# Patient Record
Sex: Female | Born: 1980 | Race: Black or African American | Hispanic: No | Smoking: Never smoker
Health system: Southern US, Community
[De-identification: ages and names within clinical notes are randomized; demographics above are authoritative.]

## PROBLEM LIST (undated history)

## (undated) DIAGNOSIS — F902 Attention-deficit hyperactivity disorder, combined type: Secondary | ICD-10-CM

## (undated) DIAGNOSIS — F908 Attention-deficit hyperactivity disorder, other type: Secondary | ICD-10-CM

## (undated) DIAGNOSIS — E282 Polycystic ovarian syndrome: Secondary | ICD-10-CM

## (undated) DIAGNOSIS — Z309 Encounter for contraceptive management, unspecified: Secondary | ICD-10-CM

## (undated) DIAGNOSIS — F419 Anxiety disorder, unspecified: Secondary | ICD-10-CM

## (undated) DIAGNOSIS — R928 Other abnormal and inconclusive findings on diagnostic imaging of breast: Secondary | ICD-10-CM

## (undated) DIAGNOSIS — T7840XA Allergy, unspecified, initial encounter: Secondary | ICD-10-CM

## (undated) DIAGNOSIS — F329 Major depressive disorder, single episode, unspecified: Secondary | ICD-10-CM

## (undated) DIAGNOSIS — F32A Depression, unspecified: Secondary | ICD-10-CM

## (undated) DIAGNOSIS — I10 Essential (primary) hypertension: Secondary | ICD-10-CM

## (undated) DIAGNOSIS — J45909 Unspecified asthma, uncomplicated: Secondary | ICD-10-CM

## (undated) DIAGNOSIS — F3341 Major depressive disorder, recurrent, in partial remission: Secondary | ICD-10-CM

## (undated) HISTORY — DX: Allergy, unspecified, initial encounter: T78.40XA

## (undated) HISTORY — DX: Anxiety disorder, unspecified: F41.9

## (undated) HISTORY — DX: Essential (primary) hypertension: I10

## (undated) HISTORY — PX: BREAST SURGERY: SHX581

## (undated) HISTORY — DX: Unspecified asthma, uncomplicated: J45.909

## (undated) HISTORY — DX: Depression, unspecified: F32.A

## (undated) HISTORY — DX: Major depressive disorder, single episode, unspecified: F32.9

## (undated) HISTORY — DX: Polycystic ovarian syndrome: E28.2

---

## 2004-04-14 ENCOUNTER — Emergency Department (HOSPITAL_COMMUNITY): Admission: EM | Admit: 2004-04-14 | Discharge: 2004-04-14 | Payer: Self-pay | Admitting: Emergency Medicine

## 2004-11-11 ENCOUNTER — Ambulatory Visit (HOSPITAL_COMMUNITY): Admission: RE | Admit: 2004-11-11 | Discharge: 2004-11-11 | Payer: Self-pay | Admitting: General Surgery

## 2004-11-11 ENCOUNTER — Ambulatory Visit (HOSPITAL_BASED_OUTPATIENT_CLINIC_OR_DEPARTMENT_OTHER): Admission: RE | Admit: 2004-11-11 | Discharge: 2004-11-11 | Payer: Self-pay | Admitting: General Surgery

## 2004-11-11 ENCOUNTER — Encounter (INDEPENDENT_AMBULATORY_CARE_PROVIDER_SITE_OTHER): Payer: Self-pay | Admitting: *Deleted

## 2007-06-28 ENCOUNTER — Ambulatory Visit (HOSPITAL_COMMUNITY): Admission: RE | Admit: 2007-06-28 | Discharge: 2007-06-28 | Payer: Self-pay | Admitting: Family Medicine

## 2007-11-20 ENCOUNTER — Ambulatory Visit (HOSPITAL_COMMUNITY): Admission: RE | Admit: 2007-11-20 | Discharge: 2007-11-20 | Payer: Self-pay | Admitting: Family Medicine

## 2009-01-06 IMAGING — US US ABDOMEN COMPLETE
1 series · 14 of 25 positions shown · non-contrast
Comparison: None

CLINICAL DATA: Abdominal and epigastric pain

ABDOMEN ULTRASOUND
TECHNIQUE: Complete abdominal ultrasound examination was performed
including evaluation of the liver, gallbladder, bile ducts,
pancreas, kidneys, spleen, IVC, and abdominal aorta.

[Series 1: us abdomen complete · 0.30mm/px · 14 of 73 slices shown]
[im 1/73]
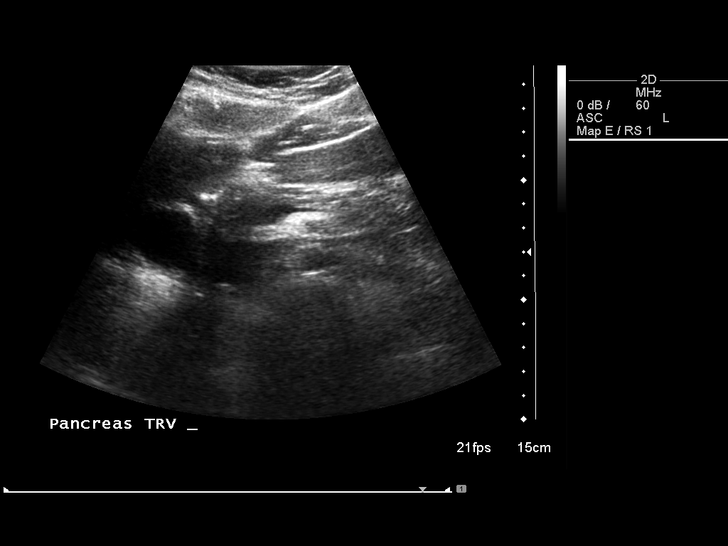
[im 7/73]
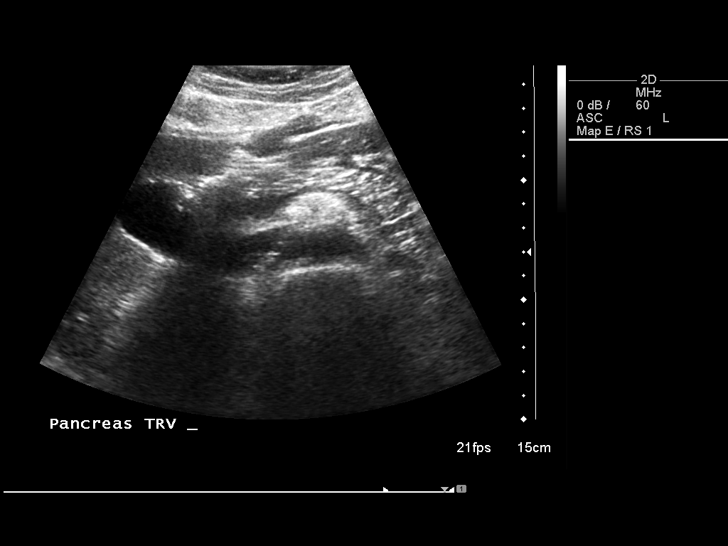
[im 13/73]
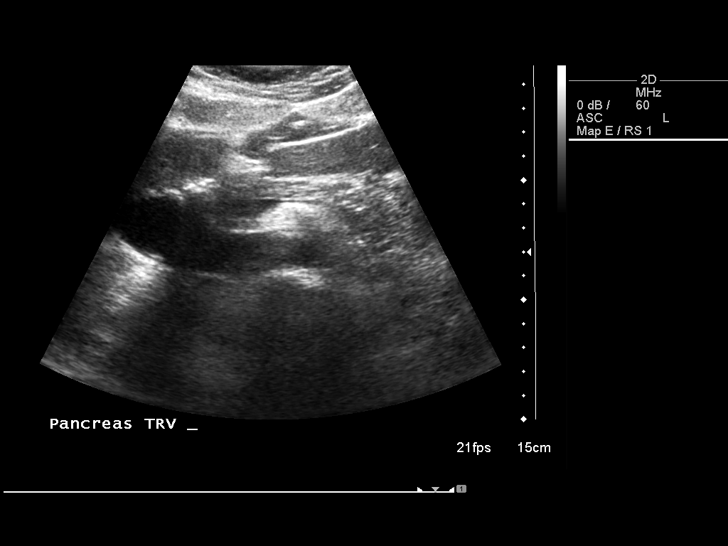
[im 19/73]
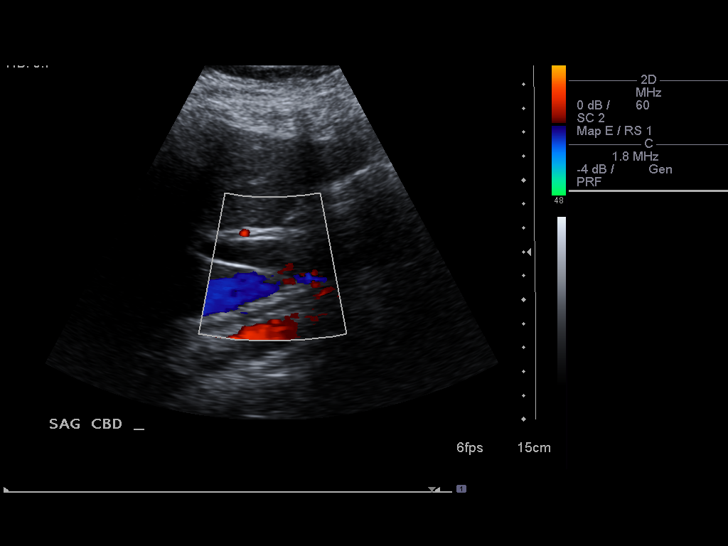
[im 25/73]
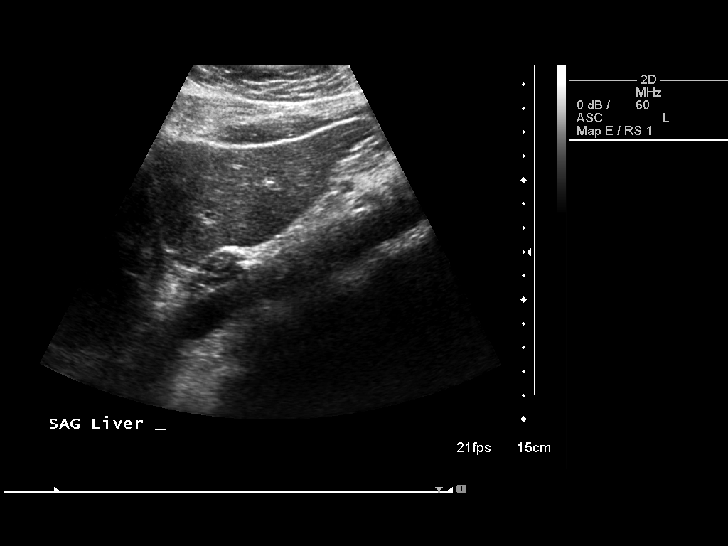
[im 28/73]
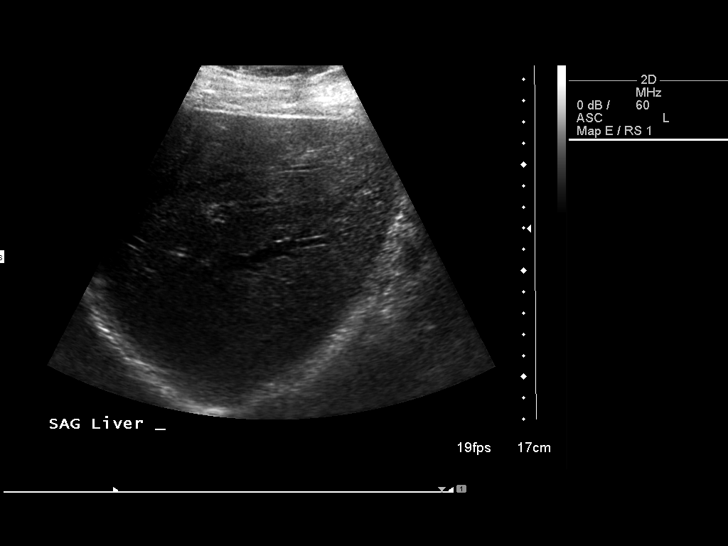
[im 34/73]
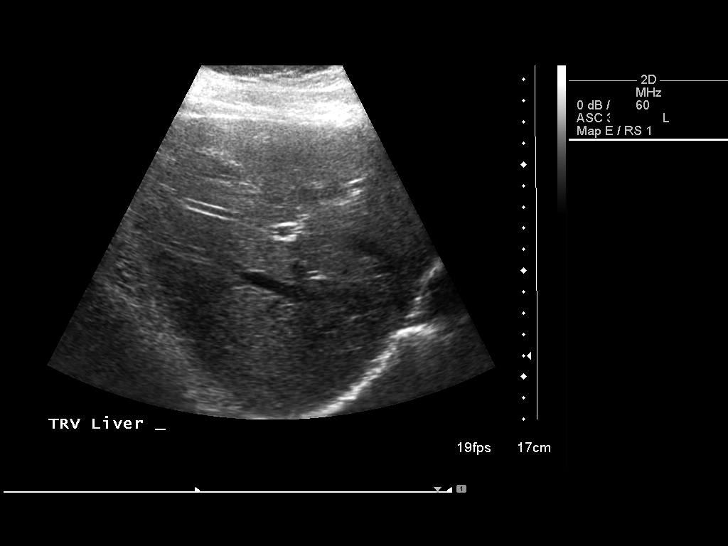
[im 40/73]
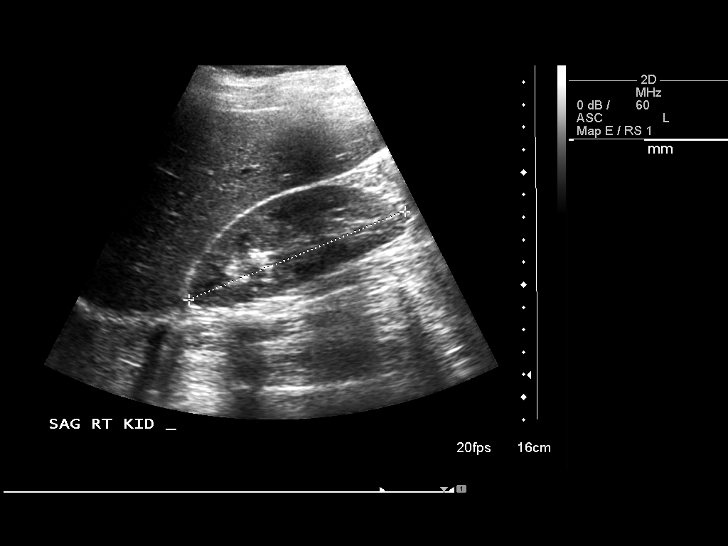
[im 46/73]
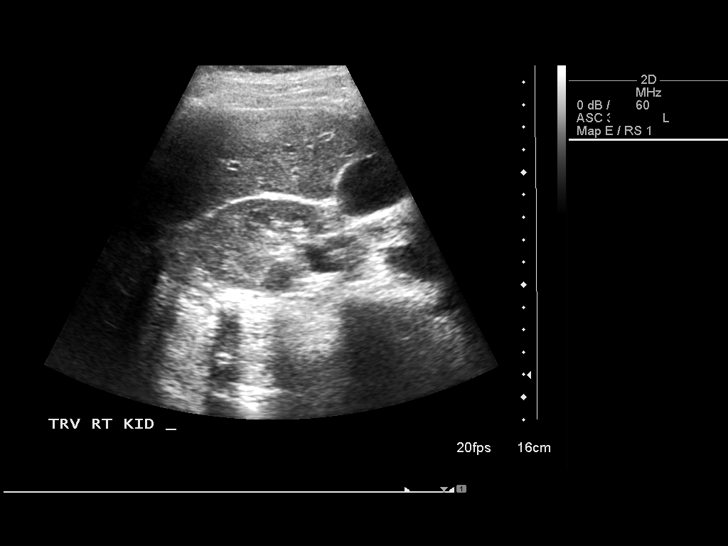
[im 49/73]
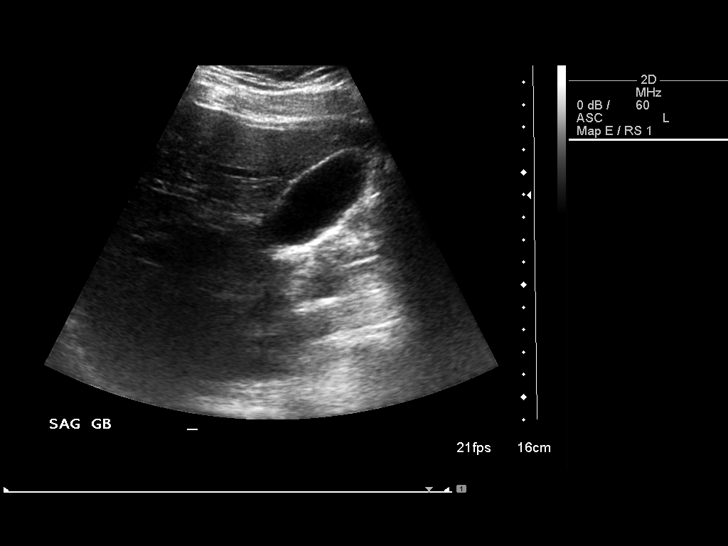
[im 55/73]
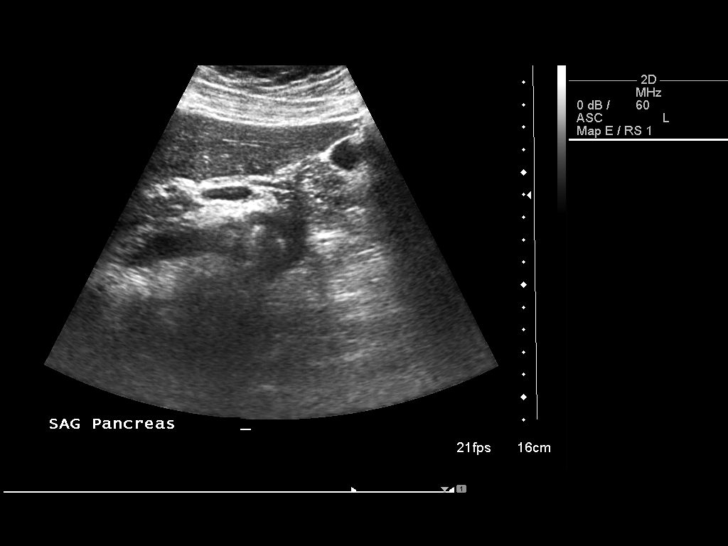
[im 61/73]
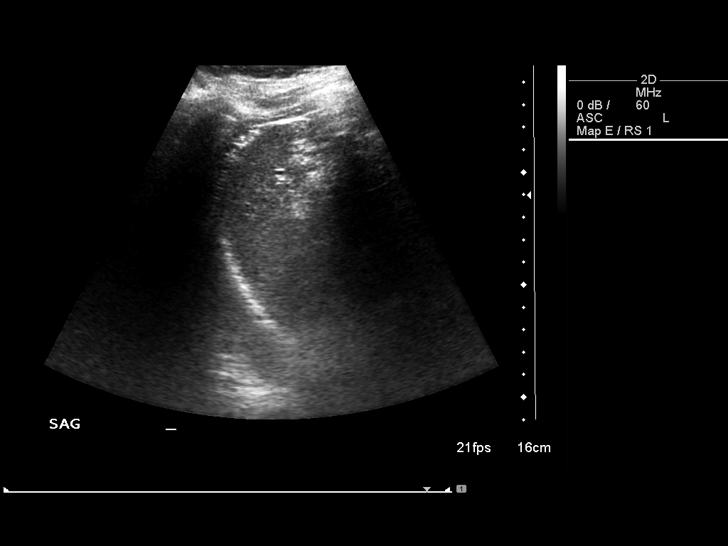
[im 67/73]
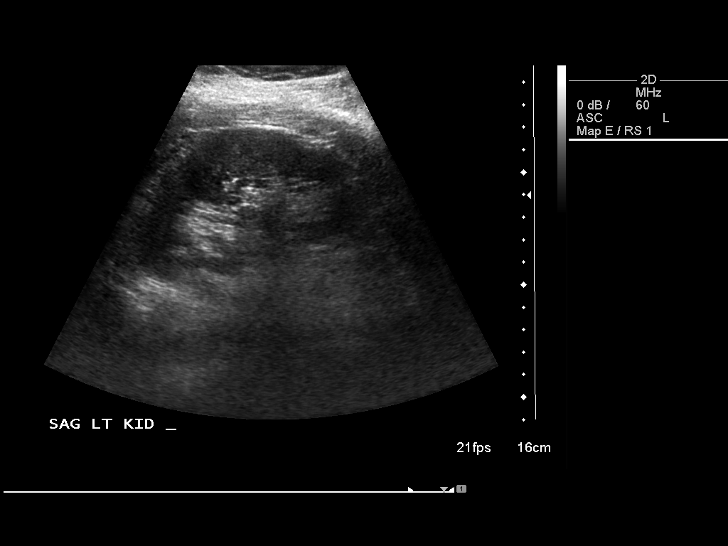
[im 73/73]
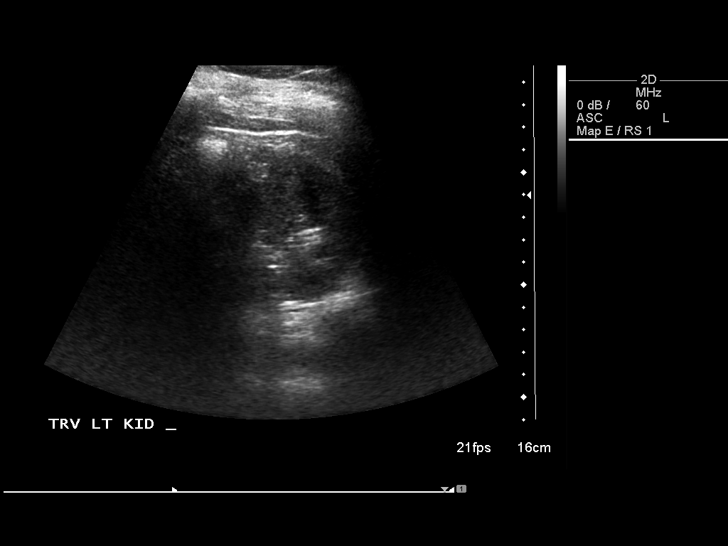

[14 of 25 positions shown; findings below may reference images not displayed]

FINDINGS: Gallbladder normally distended without stones or wall thickening.
No sonographic Murphy sign.
Common bile duct normal caliber, 3.5 mm diameter.
Liver, pancreas, and spleen normal appearance, spleen 8.9 cm
length.
Kidneys normal size and morphology, 10.3 cm length right and
cm length left.
Aorta and IVC normal.
No free fluid.
IMPRESSION: Normal upper abdominal ultrasound

## 2010-06-17 NOTE — Op Note (Signed)
NAMELINSAY, VOGT            ACCOUNT NO.:  [id number]   MEDICAL RECORD NO.:  [id number]          PATIENT TYPE:  AMB   LOCATION:  [facility]                          FACILITY:  [facility]   PHYSICIAN:  Leonie Man, M.D.   DATE OF BIRTH:  1980/03/02   DATE OF PROCEDURE:  11/11/2004  DATE OF DISCHARGE:                                 OPERATIVE REPORT   PREOPERATIVE DIAGNOSIS:  Fibroadenoma of the right breast.   POSTOPERATIVE DIAGNOSIS:  Fibroadenoma of the right breast.   PROCEDURE:  Excision of mass, right breast.   SURGEON:  Leonie Man, M.D.   ASSISTANT:  Nurse.   ANESTHESIA:  General.   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  She comes to the operating room desiring  excision of this mass.  The risks and potential benefits of surgery have  been discussed with her and with her family; she understands and gives her  consent to surgery.   PROCEDURE:  Following the induction of satisfactory general anesthesia, the  right breast was prepped and draped to be included in a sterile operative  field.  I infiltrated the region around the fibroadenoma with 0.25% Marcaine  with epinephrine.  An incision was made in the inframammary line and a  superior flap was raised to the region of the breast mass which was  approximately 2.5 cm above the incision.  The capsule of the fibroadenoma is  dissected free and the mass is then removed in its entirety and forwarded  for pathologic evaluation.  Hemostasis is obtained with electrocautery.  Sponge and instrument counts verified.  Subcutaneous tissues is closed with  interrupted 3-0 Vicryl sutures, skin closed with a running 5-0 Monocryl  suture placed subcuticularly.  The wound is then reinforced with Steri-  Strips, sterile dressings applied, the anesthetic reversed and the patient  removed from the  operating room to the recovery room in stable condition.  She tolerated the procedure well.      Leonie Man, M.D.  Electronically Signed     PB/MEDQ  D:  11/11/2004  T:  11/11/2004  Job:  [id number]

## 2012-04-26 ENCOUNTER — Ambulatory Visit (INDEPENDENT_AMBULATORY_CARE_PROVIDER_SITE_OTHER): Payer: BC Managed Care – PPO | Admitting: General Practice

## 2012-04-26 ENCOUNTER — Encounter: Payer: Self-pay | Admitting: General Practice

## 2012-04-26 ENCOUNTER — Telehealth: Payer: Self-pay | Admitting: General Practice

## 2012-04-26 VITALS — BP 122/74 | HR 83 | Temp 96.6°F | Ht 62.0 in | Wt 192.0 lb

## 2012-04-26 DIAGNOSIS — E282 Polycystic ovarian syndrome: Secondary | ICD-10-CM

## 2012-04-26 LAB — COMPLETE METABOLIC PANEL WITH GFR
ALT: 11 U/L (ref 0–35)
AST: 12 U/L (ref 0–37)
Albumin: 4.3 g/dL (ref 3.5–5.2)
Alkaline Phosphatase: 58 U/L (ref 39–117)
CO2: 28 mEq/L (ref 19–32)
Chloride: 105 mEq/L (ref 96–112)
Creat: 0.9 mg/dL (ref 0.50–1.10)

## 2012-04-26 LAB — LIPID PANEL
Cholesterol: 193 mg/dL (ref 0–200)
HDL: 48 mg/dL (ref >39)
LDL Cholesterol: 131 mg/dL — ABNORMAL HIGH (ref 0–99)
Total CHOL/HDL Ratio: 4 Ratio
Triglycerides: 71 mg/dL (ref <150)
VLDL: 14 mg/dL (ref 0–40)

## 2012-04-26 MED ORDER — METFORMIN HCL 500 MG PO TABS: 500.0000 mg | ORAL_TABLET | Freq: Two times a day (BID) | ORAL | Status: DC

## 2012-04-26 NOTE — Telephone Encounter (Signed)
6 wk f/u made

## 2012-04-26 NOTE — Patient Instructions (Addendum)
Polycystic Ovarian Syndrome Polycystic ovarian syndrome is a condition with a number of problems. One problem is with the ovaries. The ovaries are organs located in the female pelvis, on each side of the uterus. Usually, during the menstrual cycle, an egg is released from 1 ovary every month. This is called ovulation. When the egg is fertilized, it goes into the womb (uterus), which allows for the growth of a baby. The egg travels from the ovary through the fallopian tube to the uterus. The ovaries also make the hormones estrogen and progesterone. These hormones help the development of a woman's breasts, body shape, and body hair. They also regulate the menstrual cycle and pregnancy. Sometimes, cysts form in the ovaries. A cyst is a fluid-filled sac. On the ovary, different types of cysts can form. The most common type of ovarian cyst is called a functional or ovulation cyst. It is normal, and often forms during the normal menstrual cycle. Each month, a woman's ovaries grow tiny cysts that hold the eggs. When an egg is fully grown, the sac breaks open. This releases the egg. Then, the sac which released the egg from the ovary dissolves. In one type of functional cyst, called a follicle cyst, the sac does not break open to release the egg. It may actually continue to grow. This type of cyst usually disappears within 1 to 3 months.  One type of cyst problem with the ovaries is called Polycystic Ovarian Syndrome (PCOS). In this condition, many follicle cysts form, but do not rupture and produce an egg. This health problem can affect the following:  Menstrual cycle.  Heart.  Obesity.  Cancer of the uterus.  Fertility.  Blood vessels.  Hair growth (face and body) or baldness.  Hormones.  Appearance.  High blood pressure.  Stroke.  Insulin production.  Inflammation of the liver.  Elevated blood cholesterol and triglycerides. CAUSES   No one knows the exact cause of PCOS.  Women with  PCOS often have a mother or sister with PCOS. There is not yet enough proof to say this is inherited.  Many women with PCOS have a weight problem.  Researchers are looking at the relationship between PCOS and the body's ability to make insulin. Insulin is a hormone that regulates the change of sugar, starches, and other food into energy for the body's use, or for storage. Some women with PCOS make too much insulin. It is possible that the ovaries react by making too many female hormones, called androgens. This can lead to acne, excessive hair growth, weight gain, and ovulation problems.  Too much production of luteinizing hormone (LH) from the pituitary gland in the brain stimulates the ovary to produce too much female hormone (androgen). SYMPTOMS   Infrequent or no menstrual periods, and/or irregular bleeding.  Inability to get pregnant (infertility), because of not ovulating.  Increased growth of hair on the face, chest, stomach, back, thumbs, thighs, or toes.  Acne, oily skin, or dandruff.  Pelvic pain.  Weight gain or obesity, usually carrying extra weight around the waist.  Type 2 diabetes (this is the diabetes that usually does not need insulin).  High cholesterol.  High blood pressure.  Female-pattern baldness or thinning hair.  Patches of thickened and dark brown or black skin on the neck, arms, breasts, or thighs.  Skin tags, or tiny excess flaps of skin, in the armpits or neck area.  Sleep apnea (excessive snoring and breathing stops at times while asleep).  Deepening of the voice.    Gestational diabetes when pregnant.  Increased risk of miscarriage with pregnancy. DIAGNOSIS  There is no single test to diagnose PCOS.   Your caregiver will:  Take a medical history.  Perform a pelvic exam.  Perform an ultrasound.  Check your female and female hormone levels.  Measure glucose or sugar levels in the blood.  Do other blood tests.  If you are producing too many  female hormones, your caregiver will make sure it is from PCOS. At the physical exam, your caregiver will want to evaluate the areas of increased hair growth. Try to allow natural hair growth for a few days before the visit.  During a pelvic exam, the ovaries may be enlarged or swollen by the increased number of small cysts. This can be seen more easily by vaginal ultrasound or screening, to examine the ovaries and lining of the uterus (endometrium) for cysts. The uterine lining may become thicker, if there has not been a regular period. TREATMENT  Because there is no cure for PCOS, it needs to be managed to prevent problems. Treatments are based on your symptoms. Treatment is also based on whether you want to have a baby or whether you need contraception.  Treatment may include:  Progesterone hormone, to start a menstrual period.  Birth control pills, to make you have regular menstrual periods.  Medicines to make you ovulate, if you want to get pregnant.  Medicines to control your insulin.  Medicine to control your blood pressure.  Medicine and diet, to control your high cholesterol and triglycerides in your blood.  Surgery, making small holes in the ovary, to decrease the amount of female hormone production. This is done through a long, lighted tube (laparoscope), placed into the pelvis through a tiny incision in the lower abdomen. Your caregiver will go over some of the choices with you. WOMEN WITH PCOS HAVE THESE CHARACTERISTICS:  High levels of female hormones called androgens.  An irregular or no menstrual cycle.  May have many small cysts in their ovaries. PCOS is the most common hormonal reproductive problem in women of childbearing age. WHY DO WOMEN WITH PCOS HAVE TROUBLE WITH THEIR MENSTRUAL CYCLE? Each month, about 20 eggs start to mature in the ovaries. As one egg grows and matures, the follicle breaks open to release the egg, so it can travel through the fallopian tube for  fertilization. When the single egg leaves the follicle, ovulation takes place. In women with PCOS, the ovary does not make all of the hormones it needs for any of the eggs to fully mature. They may start to grow and accumulate fluid, but no one egg becomes large enough. Instead, some may remain as cysts. Since no egg matures or is released, ovulation does not occur and the hormone progesterone is not made. Without progesterone, a woman's menstrual cycle is irregular or absent. Also, the cysts produce female hormones, which continue to prevent ovulation.  Document Released: 05/12/2004 Document Revised: 04/10/2011 Document Reviewed: 12/04/2008 [employer] Patient Information 2013 [employer].  

## 2012-04-26 NOTE — Progress Notes (Signed)
  Subjective:    Patient ID: Kim Klein, female    DOB: 05-Jan-1981, 32 y.o.   MRN: [id number]  HPI Presents today for refill of metformin (taking 500mg  BID). Reports some non-compliance with taking medications. Reports last visit with OB/GYN was May 2013. Unable to get an appointment with GYN prior to prescription needing refill. Reports some increased hair growth, mood swings and believes this to be due to non-compliance with medications. Reports being non-compliant due to intolerance, due to having diarrhea for a couple days. Patient stated, "I know the diarrhea would go away if I had just kept taking the medicine, it was the same way when I first started on Metformin." Reports Menstrual cycles are 33-35 days apart, duration is about 5 days. Complains of weight gain and denies exercise or healthy diet.      Review of Systems  Constitutional: Positive for activity change and fatigue.       Less active and sleeping more (nap after work). Not resting well at night.  Respiratory: Negative for chest tightness and shortness of breath.   Cardiovascular: Negative for chest pain and palpitations.  Gastrointestinal: Negative for abdominal pain, diarrhea and rectal pain.  Genitourinary: Negative for difficulty urinating.  Skin: Negative for rash.  Neurological: Negative for dizziness and headaches.  Psychiatric/Behavioral: Negative.        Objective:   Physical Exam  Constitutional: She is oriented to person, place, and time. She appears well-developed and well-nourished.  HENT:  Head: Normocephalic and atraumatic.  Cardiovascular: Normal rate, regular rhythm and normal heart sounds.   No murmur heard. Pulmonary/Chest: Effort normal and breath sounds normal.  Abdominal: Soft. Bowel sounds are normal. She exhibits no distension. There is no tenderness. There is no rebound.  Musculoskeletal: Normal range of motion.  Neurological: She is alert and oriented to person, place, and time.  Skin:  Skin is warm and dry.  Psychiatric: She has a normal mood and affect.          Assessment & Plan:  Take medications as prescribed Discussed healthy eating habits and exercise Discussed side effects of Metformin RTO in 6 weeks for follow up or sooner if symptoms  Patient verbalized understanding and denies questions  Raymon Mutton, FNP-C

## 2012-06-07 ENCOUNTER — Other Ambulatory Visit: Payer: BC Managed Care – PPO

## 2012-06-07 ENCOUNTER — Ambulatory Visit: Payer: BC Managed Care – PPO | Admitting: General Practice

## 2012-07-02 ENCOUNTER — Ambulatory Visit (INDEPENDENT_AMBULATORY_CARE_PROVIDER_SITE_OTHER): Payer: BC Managed Care – PPO | Admitting: General Practice

## 2012-07-02 ENCOUNTER — Encounter: Payer: Self-pay | Admitting: General Practice

## 2012-07-02 VITALS — BP 113/79 | HR 77 | Temp 97.7°F | Ht 62.0 in | Wt 191.0 lb

## 2012-07-02 DIAGNOSIS — E282 Polycystic ovarian syndrome: Secondary | ICD-10-CM

## 2012-07-02 NOTE — Progress Notes (Signed)
  Subjective:    Patient ID: Kim Klein, female    DOB: [birth year], 32 y.o.   MRN: [id number]  HPI Presents today for follow up on PCOS. Reports taking metformin as directed. Reports having a normal 28 day menstrual cycle. Reports first day of last menstrual cycle was Jun 20, 2012.     Review of Systems  Constitutional: Negative for fever and chills.  Respiratory: Negative for chest tightness and shortness of breath.   Cardiovascular: Negative for chest pain and palpitations.  Gastrointestinal: Negative for vomiting, abdominal pain and blood in stool.  Genitourinary: Negative for hematuria, flank pain, vaginal discharge, difficulty urinating and dyspareunia.  Musculoskeletal: Negative.   Skin: Negative.   Neurological: Negative for dizziness, light-headedness and headaches.  Psychiatric/Behavioral: Negative.        Objective:   Physical Exam  Constitutional: She is oriented to person, place, and time. She appears well-developed and well-nourished.  Cardiovascular: Normal rate, regular rhythm and normal heart sounds.   Pulmonary/Chest: Effort normal and breath sounds normal. No respiratory distress. She exhibits no tenderness.  Abdominal: Soft. Bowel sounds are normal. She exhibits no distension. There is no tenderness.  Neurological: She is alert and oriented to person, place, and time.  Skin: Skin is warm and dry.  Psychiatric: She has a normal mood and affect.          Assessment & Plan:  1. PCOS (polycystic ovarian syndrome) - Thyroid Panel With TSH - Lipid panel - Testosterone, Total & Free Direct - DHEA - 17-Hydroxyprogesterone - Prolactin - metFORMIN (GLUCOPHAGE) 500 MG tablet; Take 1 tablet (500 mg total) by mouth 2 (two) times daily with a meal.  Dispense: 60 tablet; Refill: 3 -Follow up in three months -Patient verbalized understanding -Coralie Keens, FNP-C

## 2012-07-03 LAB — THYROID PANEL WITH TSH: T3 Uptake: 32.7 % (ref 22.5–37.0)

## 2012-07-03 LAB — LIPID PANEL
Cholesterol: 180 mg/dL (ref 0–200)
HDL: 52 mg/dL (ref >39)
Total CHOL/HDL Ratio: 3.5 Ratio
VLDL: 15 mg/dL (ref 0–40)

## 2012-07-03 LAB — PROLACTIN: Prolactin: 13.6 ng/mL

## 2012-07-03 LAB — TESTOSTERONE, TOTAL AND FREE DIRECT MEASURE
Free Testosterone, Direct: 1.8 pg/mL (ref <7.0)
Testosterone: 58 ng/dL (ref 10–70)

## 2012-07-03 MED ORDER — METFORMIN HCL 500 MG PO TABS: 500.0000 mg | ORAL_TABLET | Freq: Two times a day (BID) | ORAL | Status: DC

## 2012-07-09 NOTE — Progress Notes (Signed)
Pt aware labs wnl

## 2013-07-07 ENCOUNTER — Other Ambulatory Visit: Payer: Self-pay | Admitting: General Practice

## 2013-07-07 ENCOUNTER — Telehealth: Payer: Self-pay | Admitting: Family Medicine

## 2013-07-07 NOTE — Telephone Encounter (Signed)
Please follow up [facility]

## 2013-07-07 NOTE — Telephone Encounter (Signed)
Patient aware.

## 2013-07-07 NOTE — Telephone Encounter (Signed)
Mae it looks like this was discontinued.... Please advise

## 2013-07-08 NOTE — Telephone Encounter (Signed)
Patient last seen in office eon 07-02-12. Please advise

## 2013-07-09 NOTE — Telephone Encounter (Signed)
Patient aware and appointment made on6/19/15 with christy Lendon Colonel

## 2013-07-09 NOTE — Telephone Encounter (Signed)
Patient NTBS for follow up and lab work- no more refills with appointment

## 2013-07-18 ENCOUNTER — Encounter: Payer: Self-pay | Admitting: Family

## 2013-07-18 ENCOUNTER — Ambulatory Visit (INDEPENDENT_AMBULATORY_CARE_PROVIDER_SITE_OTHER): Payer: BC Managed Care – PPO | Admitting: Family

## 2013-07-18 VITALS — BP 119/76 | HR 76 | Temp 97.8°F | Ht 62.0 in | Wt 191.0 lb

## 2013-07-18 DIAGNOSIS — N898 Other specified noninflammatory disorders of vagina: Secondary | ICD-10-CM

## 2013-07-18 DIAGNOSIS — E282 Polycystic ovarian syndrome: Secondary | ICD-10-CM

## 2013-07-18 DIAGNOSIS — A499 Bacterial infection, unspecified: Secondary | ICD-10-CM

## 2013-07-18 DIAGNOSIS — Z0000 Encounter for general adult medical examination without abnormal findings: Secondary | ICD-10-CM

## 2013-07-18 DIAGNOSIS — B9689 Other specified bacterial agents as the cause of diseases classified elsewhere: Secondary | ICD-10-CM

## 2013-07-18 DIAGNOSIS — N760 Acute vaginitis: Secondary | ICD-10-CM

## 2013-07-18 LAB — POCT WET PREP WITH KOH
KOH PREP POC: NEGATIVE
TRICHOMONAS UA: NEGATIVE
YEAST WET PREP PER HPF POC: NEGATIVE

## 2013-07-18 LAB — POCT URINALYSIS DIPSTICK
BILIRUBIN UA: NEGATIVE
GLUCOSE UA: NEGATIVE
Ketones, UA: NEGATIVE
LEUKOCYTES UA: NEGATIVE
NITRITE UA: NEGATIVE
Protein, UA: NEGATIVE
RBC UA: NEGATIVE
Spec Grav, UA: >=1.03
UROBILINOGEN UA: NEGATIVE
pH, UA: 5

## 2013-07-18 LAB — POCT UA - MICROSCOPIC ONLY
Bacteria, U Microscopic: NEGATIVE
CASTS, UR, LPF, POC: NEGATIVE
Crystals, Ur, HPF, POC: NEGATIVE
EPITHELIAL CELLS, URINE PER MICROSCOPY: NEGATIVE
Mucus, UA: NEGATIVE
YEAST UA: NEGATIVE

## 2013-07-18 MED ORDER — METFORMIN HCL 500 MG PO TABS: ORAL_TABLET | ORAL | Status: DC

## 2013-07-18 MED ORDER — FLUCONAZOLE 150 MG PO TABS: 150.0000 mg | ORAL_TABLET | Freq: Once | ORAL | Status: DC

## 2013-07-18 MED ORDER — METRONIDAZOLE 500 MG PO TABS: 500.0000 mg | ORAL_TABLET | Freq: Two times a day (BID) | ORAL | Status: DC

## 2013-07-18 NOTE — Progress Notes (Signed)
Subjective:    Patient ID: Kim Klein, female    DOB: 1981-01-04, 33 y.o.   MRN: [id number]  Vaginal Discharge The patient's primary symptoms include a vaginal discharge. The patient's pertinent negatives include no genital itching, genital odor or pelvic pain. This is a new problem. The current episode started more than 1 year ago (6 months ago). The problem occurs intermittently. The problem has been waxing and waning. The patient is experiencing no pain. Pertinent negatives include no back pain, discolored urine, dysuria, flank pain, frequency, hematuria, nausea or vomiting. The vaginal discharge was white, yellow and thick. She uses nothing for contraception. Her menstrual history has been irregular.  PCOS Pt currently taking metformin 540m BID. Pt states she has a period avg about every 33 days with severe cramping. Her only complaints at this time is r/t to hair growth on her face. States she is having to shave daily and causing irration. Denies any pain.     Review of Systems  HENT: Negative.   Respiratory: Negative.   Cardiovascular: Negative.   Gastrointestinal: Negative for nausea and vomiting.  Genitourinary: Positive for vaginal discharge. Negative for dysuria, frequency, hematuria, flank pain and pelvic pain.  Musculoskeletal: Negative.  Negative for back pain.  Neurological: Negative.   Psychiatric/Behavioral: Negative.   All other systems reviewed and are negative.      Objective:   Physical Exam  Vitals reviewed. Constitutional: She is oriented to person, place, and time. She appears well-developed and well-nourished. No distress.  Cardiovascular: Normal rate, regular rhythm, normal heart sounds and intact distal pulses.   No murmur heard. Pulmonary/Chest: Effort normal and breath sounds normal. No respiratory distress. She has no wheezes.  Abdominal: Soft. Bowel sounds are normal. She exhibits no distension. There is no tenderness.  Musculoskeletal: Normal  range of motion. She exhibits no edema and no tenderness.  Neurological: She is alert and oriented to person, place, and time. She has normal reflexes. No cranial nerve deficit.  Skin: Skin is warm and dry.  Psychiatric: She has a normal mood and affect. Her behavior is normal. Judgment and thought content normal.     BP 119/76  Pulse 76  Temp(Src) 97.8 F (36.6 C) (Oral)  Ht '5\' 2"'  (1.575 m)  Wt 191 lb (86.637 kg)  BMI 34.93 kg/m2  Results for orders placed in visit on 07/18/13  POCT WET PREP WITH KOH      Result Value Ref Range   Trichomonas, UA Negative     Clue Cells Wet Prep HPF POC occ     Epithelial Wet Prep HPF POC mod     Yeast Wet Prep HPF POC neg     Bacteria Wet Prep HPF POC mod     RBC Wet Prep HPF POC 5-7     WBC Wet Prep HPF POC 15-20     KOH Prep POC Negative    POCT UA - MICROSCOPIC ONLY      Result Value Ref Range   WBC, Ur, HPF, POC 5-7     RBC, urine, microscopic rare     Bacteria, U Microscopic neg     Mucus, UA neg     Epithelial cells, urine per micros neg     Crystals, Ur, HPF, POC neg     Casts, Ur, LPF, POC neg     Yeast, UA neg    POCT URINALYSIS DIPSTICK      Result Value Ref Range   Color, UA yellow  Clarity, UA cloudy     Glucose, UA neg     Bilirubin, UA neg     Ketones, UA neg     Spec Grav, UA >=1.030     Blood, UA neg     pH, UA 5.0     Protein, UA neg     Urobilinogen, UA negative     Nitrite, UA neg     Leukocytes, UA Negative         Assessment & Plan:  1. PCOS (polycystic ovarian syndrome) - metFORMIN (GLUCOPHAGE) 500 MG tablet; TAKE ONE TABLET BY MOUTH TWICE DAILY WITH  A  MEAL  Dispense: 60 tablet; Refill: 11 - CMP14+EGFR - Thyroid Panel With TSH  2. Vaginal discharge - POCT Wet Prep with KOH - POCT UA - Microscopic Only - POCT urinalysis dipstick - metroNIDAZOLE (FLAGYL) 500 MG tablet; Take 1 tablet (500 mg total) by mouth 2 (two) times daily.  Dispense: 14 tablet; Refill: 0  3. Bacterial vaginal  infection -Keep area clean and dry -Cotton underwear - metroNIDAZOLE (FLAGYL) 500 MG tablet; Take 1 tablet (500 mg total) by mouth 2 (two) times daily.  Dispense: 14 tablet; Refill: 0  4. Annual physical exam - CMP14+EGFR - Lipid panel - Thyroid Panel With TSH - Vit D  25 hydroxy (rtn osteoporosis monitoring)  RTO one year Evelina Dun, FNP

## 2013-07-18 NOTE — Patient Instructions (Signed)
Polycystic Ovarian Syndrome  Polycystic ovarian syndrome (PCOS) is a common hormonal disorder among women of reproductive age. Most women with PCOS grow many small cysts on their ovaries. PCOS can cause problems with your periods and make it difficult to get pregnant. It can also cause an increased risk of miscarriage with pregnancy. If left untreated, PCOS can lead to serious health problems, such as diabetes and heart disease.  CAUSES  The cause of PCOS is not fully understood, but genetics may be a factor.  SIGNS AND SYMPTOMS    Infrequent or no menstrual periods.    Inability to get pregnant (infertility) because of not ovulating.    Increased growth of hair on the face, chest, stomach, back, thumbs, thighs, or toes.    Acne, oily skin, or dandruff.    Pelvic pain.    Weight gain or obesity, usually carrying extra weight around the waist.    Type 2 diabetes.    High cholesterol.    High blood pressure.    Female-pattern baldness or thinning hair.    Patches of thickened and dark brown or black skin on the neck, arms, breasts, or thighs.    Tiny excess flaps of skin (skin tags) in the armpits or neck area.    Excessive snoring and having breathing stop at times while asleep (sleep apnea).    Deepening of the voice.    Gestational diabetes when pregnant.   DIAGNOSIS   There is no single test to diagnose PCOS.    Your health care provider will:    Take a medical history.    Perform a pelvic exam.    Have ultrasonography done.    Check your female and female hormone levels.    Measure glucose or sugar levels in the blood.    Do other blood tests.    If you are producing too many female hormones, your health care provider will make sure it is from PCOS. At the physical exam, your health care provider will want to evaluate the areas of increased hair growth. Try to allow natural hair growth for a few days before the visit.    During a pelvic exam, the ovaries may be  enlarged or swollen because of the increased number of small cysts. This can be seen more easily by using vaginal ultrasonography or screening to examine the ovaries and lining of the uterus (endometrium) for cysts. The uterine lining may become thicker if you have not been having a regular period.   TREATMENT   Because there is no cure for PCOS, it needs to be managed to prevent problems. Treatments are based on your symptoms. Treatment is also based on whether you want to have a baby or whether you need contraception.   Treatment may include:    Progesterone hormone to start a menstrual period.    Birth control pills to make you have regular menstrual periods.    Medicines to make you ovulate, if you want to get pregnant.    Medicines to control your insulin.    Medicine to control your blood pressure.    Medicine and diet to control your high cholesterol and triglycerides in your blood.   Medicine to reduce excessive hair growth.   Surgery, making small holes in the ovary, to decrease the amount of female hormone production. This is done through a long, lighted tube (laparoscope) placed into the pelvis through a tiny incision in the lower abdomen.   HOME CARE INSTRUCTIONS   Only   take over-the-counter or prescription medicine as directed by your health care provider.   Pay attention to the foods you eat and your activity levels. This can help reduce the effects of PCOS.   Keep your weight under control.   Eat foods that are low in carbohydrate and high in fiber.   Exercise regularly.  SEEK MEDICAL CARE IF:   Your symptoms do not get better with medicine.   You have new symptoms.  Document Released: 05/12/2004 Document Revised: 11/06/2012 Document Reviewed: 07/04/2012  [employer] Patient Information 2015 [employer]. This information is not intended to replace advice given to you by your health care provider. Make sure you discuss any questions you have with your health care provider.

## 2013-07-19 LAB — CMP14+EGFR
A/G RATIO: 1.9 (ref 1.1–2.5)
ALBUMIN: 4.2 g/dL (ref 3.5–5.5)
ALK PHOS: 56 IU/L (ref 39–117)
ALT: 9 IU/L (ref 0–32)
AST: 12 IU/L (ref 0–40)
BILIRUBIN TOTAL: 0.3 mg/dL (ref 0.0–1.2)
BUN / CREAT RATIO: 11 (ref 8–20)
BUN: 10 mg/dL (ref 6–20)
CALCIUM: 9.1 mg/dL (ref 8.7–10.2)
CO2: 24 mmol/L (ref 18–29)
CREATININE: 0.88 mg/dL (ref 0.57–1.00)
Chloride: 103 mmol/L (ref 97–108)
GFR calc Af Amer: 101 mL/min/{1.73_m2} (ref >59)
GFR calc non Af Amer: 88 mL/min/{1.73_m2} (ref >59)
GLOBULIN, TOTAL: 2.2 g/dL (ref 1.5–4.5)
GLUCOSE: 74 mg/dL (ref 65–99)
Potassium: 4 mmol/L (ref 3.5–5.2)
Sodium: 140 mmol/L (ref 134–144)
TOTAL PROTEIN: 6.4 g/dL (ref 6.0–8.5)

## 2013-07-19 LAB — THYROID PANEL WITH TSH
Free Thyroxine Index: 1.9 (ref 1.2–4.9)
T3 UPTAKE RATIO: 27 % (ref 24–39)
T4 TOTAL: 7.2 ug/dL (ref 4.5–12.0)
TSH: 2.29 u[IU]/mL (ref 0.450–4.500)

## 2013-07-19 LAB — LIPID PANEL
CHOL/HDL RATIO: 3.2 ratio (ref 0.0–4.4)
Cholesterol, Total: 174 mg/dL (ref 100–199)
HDL: 55 mg/dL (ref >39)
LDL Calculated: 110 mg/dL — ABNORMAL HIGH (ref 0–99)
Triglycerides: 45 mg/dL (ref 0–149)
VLDL Cholesterol Cal: 9 mg/dL (ref 5–40)

## 2013-07-19 LAB — VITAMIN D 25 HYDROXY (VIT D DEFICIENCY, FRACTURES): VIT D 25 HYDROXY: 16.9 ng/mL — AB (ref 30.0–100.0)

## 2013-09-29 ENCOUNTER — Encounter: Payer: Self-pay | Admitting: Family Medicine

## 2013-09-29 ENCOUNTER — Ambulatory Visit: Payer: BC Managed Care – PPO | Admitting: Nurse Practitioner

## 2013-09-29 ENCOUNTER — Encounter (INDEPENDENT_AMBULATORY_CARE_PROVIDER_SITE_OTHER): Payer: Self-pay

## 2013-09-29 ENCOUNTER — Ambulatory Visit (INDEPENDENT_AMBULATORY_CARE_PROVIDER_SITE_OTHER): Payer: BC Managed Care – PPO | Admitting: Family Medicine

## 2013-09-29 VITALS — BP 109/72 | HR 80 | Temp 98.3°F | Ht 62.0 in | Wt 190.0 lb

## 2013-09-29 DIAGNOSIS — Z111 Encounter for screening for respiratory tuberculosis: Secondary | ICD-10-CM

## 2013-09-29 DIAGNOSIS — Z30019 Encounter for initial prescription of contraceptives, unspecified: Secondary | ICD-10-CM

## 2013-09-29 DIAGNOSIS — Z0289 Encounter for other administrative examinations: Secondary | ICD-10-CM

## 2013-09-29 DIAGNOSIS — Z021 Encounter for pre-employment examination: Secondary | ICD-10-CM

## 2013-09-29 DIAGNOSIS — Z23 Encounter for immunization: Secondary | ICD-10-CM

## 2013-09-29 MED ORDER — LEVONORGESTREL-ETHINYL ESTRAD 0.15-30 MG-MCG PO TABS: 1.0000 | ORAL_TABLET | Freq: Every day | ORAL | Status: DC

## 2013-09-29 NOTE — Addendum Note (Signed)
Addended by: Bearl Mulberry on: 09/29/2013 04:41 PM   Modules accepted: Orders

## 2013-09-29 NOTE — Progress Notes (Signed)
   Subjective:    Patient ID: Kim Klein, female    DOB: May 23, 1980, 33 y.o.   MRN: [id number]  HPI Patient is requesting OCP for contraception.  She wants to reschedule appointment for pap smear with female provider.  Her LMP was 09/09/13.  G0 P0.  She needs PPD and pre-employment exam done.  She needs pre-employment exam for [employer].   Review of Systems No chest pain, SOB, HA, dizziness, vision change, N/V, diarrhea, constipation, dysuria, urinary urgency or frequency, myalgias, arthralgias or rash.     Objective:   Physical Exam Vital signs noted  Well developed well nourished female.  HEENT - Head atraumatic Normocephalic                Eyes - PERRLA, Conjuctiva - clear Sclera- Clear EOMI                Ears - EAC's Wnl TM's Wnl Gross Hearing WNL                Nose - Nares patent                 Throat - oropharanx wnl Respiratory - Lungs CTA bilateral Cardiac - RRR S1 and S2 without murmur GI - Abdomen soft Nontender and bowel sounds active x 4 Extremities - No edema. Neuro - Grossly intact.       Assessment & Plan:  Encounter for initial prescription of contraceptives - Plan: levonorgestrel-ethinyl estradiol (NORDETTE) 0.15-30 MG-MCG tablet  Physical exam, pre-employment - Plan: PPD.  Form filled out.  Deatra Canter FNP

## 2013-10-01 LAB — TB SKIN TEST
Induration: 0 mm
TB Skin Test: NEGATIVE

## 2014-02-09 ENCOUNTER — Ambulatory Visit (INDEPENDENT_AMBULATORY_CARE_PROVIDER_SITE_OTHER): Payer: BC Managed Care – PPO | Admitting: Nurse Practitioner

## 2014-02-09 ENCOUNTER — Encounter (INDEPENDENT_AMBULATORY_CARE_PROVIDER_SITE_OTHER): Payer: Self-pay

## 2014-02-09 ENCOUNTER — Encounter: Payer: Self-pay | Admitting: Nurse Practitioner

## 2014-02-09 VITALS — BP 125/81 | HR 71 | Temp 97.6°F | Ht 62.0 in | Wt 193.0 lb

## 2014-02-09 DIAGNOSIS — R197 Diarrhea, unspecified: Secondary | ICD-10-CM

## 2014-02-09 DIAGNOSIS — L739 Follicular disorder, unspecified: Secondary | ICD-10-CM

## 2014-02-09 NOTE — Progress Notes (Signed)
   Subjective:    Patient ID: Kim Klein, female    DOB: 09/19/1980, 34 y.o.   MRN: [id number]  HPI Patient in today with several compliants; - Diarrhea that started Last Thursday- going 5-6 times a day- cramping at times- worse after she eats. Imodium AD OTC has not helped much. - rash on inner thighs- started Saturday- no itching- painful at times    Review of Systems  Constitutional: Negative.   HENT: Negative.   Respiratory: Negative.   Cardiovascular: Negative.   Gastrointestinal: Positive for diarrhea.  Genitourinary: Negative.   Neurological: Negative.   Psychiatric/Behavioral: Negative.   All other systems reviewed and are negative.      Objective:   Physical Exam  Constitutional: She appears well-developed and well-nourished.  Cardiovascular: Normal rate, regular rhythm and normal heart sounds.   Pulmonary/Chest: Effort normal and breath sounds normal.  Abdominal: Soft.  Hyperactive bowel sounds left upper and lower quadrant  Skin: Skin is warm.  Pustule lesions on inner thighs and lower abd wall  Psychiatric: She has a normal mood and affect. Her behavior is normal. Judgment and thought content normal.   BP 125/81 mmHg  Pulse 71  Temp(Src) 97.6 F (36.4 C) (Oral)  Ht 5\' 2"  (1.575 m)  Wt 193 lb (87.544 kg)  BMI 35.29 kg/m2        Assessment & Plan:  1. Diarrhea Force fluids Specimen containers for stool culture, O&P etc Continue imodium AD OTC as needed May just have to run it's course  2. Chronic folliculitis Shave in direction hair grows Do not wear tight fitting pants rto prn  Mary-Margaret Daphine DeutscherMartin, FNP

## 2014-02-09 NOTE — Patient Instructions (Signed)

## 2014-02-11 ENCOUNTER — Other Ambulatory Visit: Payer: BC Managed Care – PPO

## 2014-02-11 DIAGNOSIS — R197 Diarrhea, unspecified: Secondary | ICD-10-CM

## 2014-02-11 NOTE — Progress Notes (Signed)
Lab only specimen drop off 

## 2014-02-17 LAB — CDIFF NAA+O+P+STOOL CULTURE
CDIFFPCR: NEGATIVE
E coli, Shiga toxin Assay: NEGATIVE

## 2014-02-27 ENCOUNTER — Ambulatory Visit (INDEPENDENT_AMBULATORY_CARE_PROVIDER_SITE_OTHER): Payer: BC Managed Care – PPO | Admitting: Family Medicine

## 2014-02-27 ENCOUNTER — Encounter: Payer: Self-pay | Admitting: Family Medicine

## 2014-02-27 VITALS — BP 110/74 | HR 75 | Temp 98.0°F | Ht 62.0 in | Wt 191.0 lb

## 2014-02-27 DIAGNOSIS — L03119 Cellulitis of unspecified part of limb: Secondary | ICD-10-CM

## 2014-02-27 DIAGNOSIS — L02419 Cutaneous abscess of limb, unspecified: Secondary | ICD-10-CM

## 2014-02-27 MED ORDER — DOXYCYCLINE HYCLATE 100 MG PO TABS: 100.0000 mg | ORAL_TABLET | Freq: Two times a day (BID) | ORAL | Status: DC

## 2014-02-27 NOTE — Progress Notes (Signed)
   Subjective:    Patient ID: Kim Klein, female    DOB: November 23, 1980, 34 y.o.   MRN: [id number]  HPI Patient is c/o 2 cysts or boils on her left thigh.  Review of Systems  Constitutional: Negative for fever.  HENT: Negative for ear pain.   Eyes: Negative for discharge.  Respiratory: Negative for cough.   Cardiovascular: Negative for chest pain.  Gastrointestinal: Negative for abdominal distention.  Endocrine: Negative for polyuria.  Genitourinary: Negative for difficulty urinating.  Musculoskeletal: Negative for gait problem and neck pain.  Skin: Negative for color change and rash.  Neurological: Negative for speech difficulty and headaches.  Psychiatric/Behavioral: Negative for agitation.       Objective:    BP 110/74 mmHg  Pulse 75  Temp(Src) 98 F (36.7 C) (Oral)  Ht 5\' 2"  (1.575 m)  Wt 191 lb (86.637 kg)  BMI 34.93 kg/m2  LMP 02/23/2014 Physical Exam  Constitutional: She is oriented to person, place, and time. She appears well-developed and well-nourished.  HENT:  Head: Normocephalic and atraumatic.  Mouth/Throat: Oropharynx is clear and moist.  Eyes: Pupils are equal, round, and reactive to light.  Neck: Normal range of motion. Neck supple.  Cardiovascular: Normal rate and regular rhythm.   No murmur heard. Pulmonary/Chest: Effort normal and breath sounds normal.  Abdominal: Soft. Bowel sounds are normal. There is no tenderness.  Neurological: She is alert and oriented to person, place, and time.  Skin: Skin is warm and dry.  Left thigh with 2 areas of cellulitis approx 2 cm diameter no fluctuance or cyst present.  Psychiatric: She has a normal mood and affect.          Assessment & Plan:     ICD-9-CM ICD-10-CM   1. Cellulitis and abscess of leg 682.6 L02.419 doxycycline (VIBRA-TABS) 100 MG tablet    L03.119      No Follow-up on file.  Deatra CanterWilliam J Marah Park FNP

## 2014-07-13 ENCOUNTER — Encounter (INDEPENDENT_AMBULATORY_CARE_PROVIDER_SITE_OTHER): Payer: Self-pay

## 2014-07-13 ENCOUNTER — Ambulatory Visit (INDEPENDENT_AMBULATORY_CARE_PROVIDER_SITE_OTHER): Payer: BC Managed Care – PPO | Admitting: Family Medicine

## 2014-07-13 ENCOUNTER — Encounter: Payer: Self-pay | Admitting: Family Medicine

## 2014-07-13 VITALS — BP 118/75 | HR 101 | Temp 98.7°F | Ht 62.0 in | Wt 198.0 lb

## 2014-07-13 DIAGNOSIS — J329 Chronic sinusitis, unspecified: Secondary | ICD-10-CM | POA: Diagnosis not present

## 2014-07-13 DIAGNOSIS — J40 Bronchitis, not specified as acute or chronic: Secondary | ICD-10-CM | POA: Diagnosis not present

## 2014-07-13 MED ORDER — PSEUDOEPHEDRINE-GUAIFENESIN ER 120-1200 MG PO TB12: 1.0000 | ORAL_TABLET | Freq: Two times a day (BID) | ORAL | Status: DC

## 2014-07-13 MED ORDER — LEVOFLOXACIN 500 MG PO TABS: 500.0000 mg | ORAL_TABLET | Freq: Every day | ORAL | Status: DC

## 2014-07-13 NOTE — Progress Notes (Signed)
Subjective:  Patient ID: Kim Klein, female    DOB: Feb 02, 1980  Age: 34 y.o. MRN: [id number]  CC: URI   HPI Kim Klein presents for head and chest congestion 2 weeks duration. She also has a cough productive of green sputum. Each of these was moderate in severity and increasing and severity and frequency over the last several days. Not responsive to over-the-counter remedies. She has had 101 fever with chills and sweats. Shortness of breath relieved by her rescue inhaler but is steady over the last several days. or History Kim Klein has a past medical history of PCOS (polycystic ovarian syndrome).   She has past surgical history that includes Breast surgery.   Her family history includes Cancer in her maternal aunt and maternal grandmother.She reports that she has never smoked. She does not have any smokeless tobacco history on file. She reports that she does not drink alcohol or use illicit drugs.  Outpatient Prescriptions Prior to Visit  Medication Sig Dispense Refill  . levonorgestrel-ethinyl estradiol (NORDETTE) 0.15-30 MG-MCG tablet Take 1 tablet by mouth daily. 1 Package 11  . metFORMIN (GLUCOPHAGE) 500 MG tablet TAKE ONE TABLET BY MOUTH TWICE DAILY WITH  A  MEAL 60 tablet 11  . doxycycline (VIBRA-TABS) 100 MG tablet Take 1 tablet (100 mg total) by mouth 2 (two) times daily. (Patient not taking: Reported on 07/13/2014) 28 tablet 0   No facility-administered medications prior to visit.    ROS Review of Systems  Constitutional: Positive for fever, chills and diaphoresis. Negative for activity change and appetite change.  HENT: Positive for congestion, postnasal drip, rhinorrhea and sinus pressure. Negative for ear discharge, ear pain, hearing loss, nosebleeds, sneezing and trouble swallowing.   Respiratory: Positive for cough and shortness of breath. Negative for chest tightness.   Cardiovascular: Negative for chest pain and palpitations.  Skin: Negative for rash.     Objective:  BP 118/75 mmHg  Pulse 101  Temp(Src) 98.7 F (37.1 C) (Oral)  Ht  (1.575 m)  Wt 198 lb (89.812 kg)  BMI 36.21 kg/m2  LMP 06/22/2014  BP Readings from Last 3 Encounters:  07/13/14 118/75  02/27/14 110/74  02/09/14 125/81    Wt Readings from Last 3 Encounters:  07/13/14 198 lb (89.812 kg)  02/27/14 191 lb (86.637 kg)  02/09/14 193 lb (87.544 kg)     Physical Exam  Constitutional: She appears well-developed and well-nourished.  HENT:  Head: Normocephalic and atraumatic.  Right Ear: Tympanic membrane and external ear normal. No decreased hearing is noted.  Left Ear: Tympanic membrane and external ear normal. No decreased hearing is noted.  Nose: Mucosal edema present. Right sinus exhibits no frontal sinus tenderness. Left sinus exhibits no frontal sinus tenderness.  Mouth/Throat: No oropharyngeal exudate or posterior oropharyngeal erythema.  Neck: Neck supple. No Brudzinski's sign noted. No thyromegaly present.  Pulmonary/Chest: Effort normal. No respiratory distress. She has wheezes.  Abdominal: There is no tenderness.  Lymphadenopathy:       Head (right side): No preauricular adenopathy present.       Head (left side): No preauricular adenopathy present.    She has no cervical adenopathy.       Right cervical: No superficial cervical adenopathy present.      Left cervical: No superficial cervical adenopathy present.    Lab Results  Component Value Date   HGBA1C 4.7 04/26/2012    Lab Results  Component Value Date   GLUCOSE 74 07/18/2013   CHOL 174 07/18/2013  TRIG 45 07/18/2013   HDL 55 07/18/2013   LDLCALC 110* 07/18/2013   ALT 9 07/18/2013   AST 12 07/18/2013   NA 140 07/18/2013   K 4.0 07/18/2013   CL 103 07/18/2013   CREATININE 0.88 07/18/2013   BUN 10 07/18/2013   CO2 24 07/18/2013   TSH 2.290 07/18/2013   HGBA1C 4.7 04/26/2012    US Transvaginal Non-ob  11/20/2007   Clinical Data: 34 year old female with secondary  amenorrhea.  The patient reports LMP in July 2009 and she discontinued the NuvaRing in May 2009.   TRANSABDOMINAL AND TRANSVAGINAL ULTRASOUND OF PELVIS   Technique:  Both transabdominal and transvaginal ultrasound examinations of the pelvis were performed including evaluation of the uterus, ovaries, adnexal regions, and pelvic cul-de-sac.   Comparison: None.   Findings: The uterus has a normal sonographic appearance measuring 7.0 x 2.8 x 3.5 cm.  Endometrium measures 7 mm in thickness and is unremarkable.  The right ovary is normal with multiple small follicles measuring 3.1 x 2.0 x 4.1 cm.  The left ovary is normal with multiple small follicles measuring 3.8 x 2.2 x 2.5 cm.  No pelvic free fluid.   IMPRESSION: Normal pelvic ultrasound.  Provider: Morley Kos  US Pelvis Complete Modify  11/20/2007   Clinical Data: 34 year old female with secondary amenorrhea.  The patient reports LMP in July 2009 and she discontinued the NuvaRing in May 2009.   TRANSABDOMINAL AND TRANSVAGINAL ULTRASOUND OF PELVIS   Technique:  Both transabdominal and transvaginal ultrasound examinations of the pelvis were performed including evaluation of the uterus, ovaries, adnexal regions, and pelvic cul-de-sac.   Comparison: None.   Findings: The uterus has a normal sonographic appearance measuring 7.0 x 2.8 x 3.5 cm.  Endometrium measures 7 mm in thickness and is unremarkable.  The right ovary is normal with multiple small follicles measuring 3.1 x 2.0 x 4.1 cm.  The left ovary is normal with multiple small follicles measuring 3.8 x 2.2 x 2.5 cm.  No pelvic free fluid.   IMPRESSION: Normal pelvic ultrasound.  Provider: Morley Kos   Assessment & Plan:   Kim Klein was seen today for uri.  Diagnoses and all orders for this visit:  Sinobronchitis  Other orders -     levofloxacin (LEVAQUIN) 500 MG tablet; Take 1 tablet (500 mg total) by mouth daily. -     Pseudoephedrine-Guaifenesin [id number] MG TB12; Take 1 tablet by  mouth 2 (two) times daily. For congeestion  I have discontinued Kim Klein's doxycycline. I am also having her start on levofloxacin and Pseudoephedrine-Guaifenesin. Additionally, I am having her maintain her metFORMIN and levonorgestrel-ethinyl estradiol.  Meds ordered this encounter  Medications  . levofloxacin (LEVAQUIN) 500 MG tablet    Sig: Take 1 tablet (500 mg total) by mouth daily.    Dispense:  10 tablet    Refill:  0  . Pseudoephedrine-Guaifenesin [id number] MG TB12    Sig: Take 1 tablet by mouth 2 (two) times daily. For congeestion    Dispense:  20 each    Refill:  0     Follow-up: Return if symptoms worsen or fail to improve.  Mechele Claude, M.D.

## 2014-10-18 ENCOUNTER — Other Ambulatory Visit: Payer: Self-pay | Admitting: Family Medicine

## 2014-10-19 NOTE — Telephone Encounter (Signed)
ntbs for pap- one month rx given to give time to make appointment

## 2014-10-19 NOTE — Telephone Encounter (Signed)
Detailed message left for patient to call and schedule an appointment.

## 2014-11-13 ENCOUNTER — Other Ambulatory Visit: Payer: Self-pay | Admitting: Nurse Practitioner

## 2014-12-17 ENCOUNTER — Encounter: Payer: Self-pay | Admitting: Family

## 2014-12-17 ENCOUNTER — Ambulatory Visit (INDEPENDENT_AMBULATORY_CARE_PROVIDER_SITE_OTHER): Payer: BC Managed Care – PPO | Admitting: Family

## 2014-12-17 VITALS — BP 108/78 | HR 80 | Temp 97.8°F | Ht 62.0 in | Wt 193.0 lb

## 2014-12-17 DIAGNOSIS — Z23 Encounter for immunization: Secondary | ICD-10-CM | POA: Diagnosis not present

## 2014-12-17 DIAGNOSIS — Z3041 Encounter for surveillance of contraceptive pills: Secondary | ICD-10-CM | POA: Diagnosis not present

## 2014-12-17 DIAGNOSIS — E282 Polycystic ovarian syndrome: Secondary | ICD-10-CM

## 2014-12-17 MED ORDER — METFORMIN HCL 500 MG PO TABS: ORAL_TABLET | ORAL | Status: DC

## 2014-12-17 MED ORDER — LEVONORGESTREL-ETHINYL ESTRAD 0.15-30 MG-MCG PO TABS: 1.0000 | ORAL_TABLET | Freq: Every day | ORAL | Status: DC

## 2014-12-17 NOTE — Progress Notes (Signed)
   Subjective:    Patient ID: Kim Klein, female    DOB: 1980-11-07, 34 y.o.   MRN: [id number]  HPI Pt presents to the office today for chronic follow up. Pt currently taking metformin 500mg  BID for PCOS. Pt states she has a period avg about every 28 days. Pt states she is using a new facial cream that she has noticed has helped her facial hair. Denies any pain. Pt states her last pap was two years ago in 2014. Pt states she will schedule a pap in the next few weeks. Pt denies any headache, palpitations, SOB, or edema at this time.     Review of Systems  Constitutional: Negative.   HENT: Negative.   Eyes: Negative.   Respiratory: Negative.  Negative for shortness of breath.   Cardiovascular: Negative.  Negative for palpitations.  Gastrointestinal: Negative.   Endocrine: Negative.   Genitourinary: Negative.   Musculoskeletal: Negative.   Neurological: Negative.  Negative for headaches.  Hematological: Negative.   Psychiatric/Behavioral: Negative.   All other systems reviewed and are negative.      Objective:   Physical Exam  Constitutional: She is oriented to person, place, and time. She appears well-developed and well-nourished. No distress.  HENT:  Head: Normocephalic and atraumatic.  Right Ear: External ear normal.  Left Ear: External ear normal.  Nose: Nose normal.  Mouth/Throat: Oropharynx is clear and moist.  Eyes: Pupils are equal, round, and reactive to light.  Neck: Normal range of motion. Neck supple. No thyromegaly present.  Cardiovascular: Normal rate, regular rhythm, normal heart sounds and intact distal pulses.   No murmur heard. Pulmonary/Chest: Effort normal and breath sounds normal. No respiratory distress. She has no wheezes.  Abdominal: Soft. Bowel sounds are normal. She exhibits no distension. There is no tenderness.  Musculoskeletal: Normal range of motion. She exhibits no edema or tenderness.  Neurological: She is alert and oriented to person,  place, and time. She has normal reflexes. No cranial nerve deficit.  Skin: Skin is warm and dry.  Psychiatric: She has a normal mood and affect. Her behavior is normal. Judgment and thought content normal.  Vitals reviewed.     BP 108/78 mmHg  Pulse 80  Temp(Src) 97.8 F (36.6 C) (Oral)  Ht 5\' 2"  (1.575 m)  Wt 193 lb (87.544 kg)  BMI 35.29 kg/m2     Assessment & Plan:  1. Encounter for immunization  2. PCOS (polycystic ovarian syndrome) - metFORMIN (GLUCOPHAGE) 500 MG tablet; TAKE ONE TABLET BY MOUTH TWICE DAILY WITH  A  MEAL  Dispense: 180 tablet; Refill: 3  3. Encounter for surveillance of contraceptive pills - levonorgestrel-ethinyl estradiol (KURVELO) 0.15-30 MG-MCG tablet; Take 1 tablet by mouth daily.  Dispense: 90 tablet; Refill: 3   Continue all meds Health Maintenance reviewed Diet and exercise encouraged RTO 1 year and pt to schedule pap in next few weeks  Jannifer Rodneyhristy Momo Braun, FNP

## 2014-12-17 NOTE — Patient Instructions (Signed)
Health Maintenance, Female Adopting a healthy lifestyle and getting preventive care can go a long way to promote health and wellness. Talk with your health care provider about what schedule of regular examinations is right for you. This is a good chance for you to check in with your provider about disease prevention and staying healthy. In between checkups, there are plenty of things you can do on your own. Experts have done a lot of research about which lifestyle changes and preventive measures are most likely to keep you healthy. Ask your health care provider for more information. WEIGHT AND DIET  Eat a healthy diet  Be sure to include plenty of vegetables, fruits, low-fat dairy products, and lean protein.  Do not eat a lot of foods high in solid fats, added sugars, or salt.  Get regular exercise. This is one of the most important things you can do for your health.  Most adults should exercise for at least 150 minutes each week. The exercise should increase your heart rate and make you sweat (moderate-intensity exercise).  Most adults should also do strengthening exercises at least twice a week. This is in addition to the moderate-intensity exercise.  Maintain a healthy weight  Body mass index (BMI) is a measurement that can be used to identify possible weight problems. It estimates body fat based on height and weight. Your health care provider can help determine your BMI and help you achieve or maintain a healthy weight.  For females 20 years of age and older:   A BMI below 18.5 is considered underweight.  A BMI of 18.5 to 24.9 is normal.  A BMI of 25 to 29.9 is considered overweight.  A BMI of 30 and above is considered obese.  Watch levels of cholesterol and blood lipids  You should start having your blood tested for lipids and cholesterol at 33 years of age, then have this test every 5 years.  You may need to have your cholesterol levels checked more often if:  Your lipid  or cholesterol levels are high.  You are older than 33 years of age.  You are at high risk for heart disease.  CANCER SCREENING   Lung Cancer  Lung cancer screening is recommended for adults 55-80 years old who are at high risk for lung cancer because of a history of smoking.  A yearly low-dose CT scan of the lungs is recommended for people who:  Currently smoke.  Have quit within the past 15 years.  Have at least a 30-pack-year history of smoking. A pack year is smoking an average of one pack of cigarettes a day for 1 year.  Yearly screening should continue until it has been 15 years since you quit.  Yearly screening should stop if you develop a health problem that would prevent you from having lung cancer treatment.  Breast Cancer  Practice breast self-awareness. This means understanding how your breasts normally appear and feel.  It also means doing regular breast self-exams. Let your health care provider know about any changes, no matter how small.  If you are in your 20s or 30s, you should have a clinical breast exam (CBE) by a health care provider every 1-3 years as part of a regular health exam.  If you are 40 or older, have a CBE every year. Also consider having a breast X-ray (mammogram) every year.  If you have a family history of breast cancer, talk to your health care provider about genetic screening.  If you   are at high risk for breast cancer, talk to your health care provider about having an MRI and a mammogram every year.  Breast cancer gene (BRCA) assessment is recommended for women who have family members with BRCA-related cancers. BRCA-related cancers include:  Breast.  Ovarian.  Tubal.  Peritoneal cancers.  Results of the assessment will determine the need for genetic counseling and BRCA1 and BRCA2 testing. Cervical Cancer Your health care provider may recommend that you be screened regularly for cancer of the pelvic organs (ovaries, uterus, and  vagina). This screening involves a pelvic examination, including checking for microscopic changes to the surface of your cervix (Pap test). You may be encouraged to have this screening done every 3 years, beginning at age 21.  For women ages 30-65, health care providers may recommend pelvic exams and Pap testing every 3 years, or they may recommend the Pap and pelvic exam, combined with testing for human papilloma virus (HPV), every 5 years. Some types of HPV increase your risk of cervical cancer. Testing for HPV may also be done on women of any age with unclear Pap test results.  Other health care providers may not recommend any screening for nonpregnant women who are considered low risk for pelvic cancer and who do not have symptoms. Ask your health care provider if a screening pelvic exam is right for you.  If you have had past treatment for cervical cancer or a condition that could lead to cancer, you need Pap tests and screening for cancer for at least 20 years after your treatment. If Pap tests have been discontinued, your risk factors (such as having a new sexual partner) need to be reassessed to determine if screening should resume. Some women have medical problems that increase the chance of getting cervical cancer. In these cases, your health care provider may recommend more frequent screening and Pap tests. Colorectal Cancer  This type of cancer can be detected and often prevented.  Routine colorectal cancer screening usually begins at 33 years of age and continues through 33 years of age.  Your health care provider may recommend screening at an earlier age if you have risk factors for colon cancer.  Your health care provider may also recommend using home test kits to check for hidden blood in the stool.  A small camera at the end of a tube can be used to examine your colon directly (sigmoidoscopy or colonoscopy). This is done to check for the earliest forms of colorectal  cancer.  Routine screening usually begins at age 50.  Direct examination of the colon should be repeated every 5-10 years through 33 years of age. However, you may need to be screened more often if early forms of precancerous polyps or small growths are found. Skin Cancer  Check your skin from head to toe regularly.  Tell your health care provider about any new moles or changes in moles, especially if there is a change in a mole's shape or color.  Also tell your health care provider if you have a mole that is larger than the size of a pencil eraser.  Always use sunscreen. Apply sunscreen liberally and repeatedly throughout the day.  Protect yourself by wearing long sleeves, pants, a wide-brimmed hat, and sunglasses whenever you are outside. HEART DISEASE, DIABETES, AND HIGH BLOOD PRESSURE   High blood pressure causes heart disease and increases the risk of stroke. High blood pressure is more likely to develop in:  People who have blood pressure in the high end   of the normal range (130-139/85-89 mm Hg).  People who are overweight or obese.  People who are African American.  If you are 38-23 years of age, have your blood pressure checked every 3-5 years. If you are 61 years of age or older, have your blood pressure checked every year. You should have your blood pressure measured twice--once when you are at a hospital or clinic, and once when you are not at a hospital or clinic. Record the average of the two measurements. To check your blood pressure when you are not at a hospital or clinic, you can use:  An automated blood pressure machine at a pharmacy.  A home blood pressure monitor.  If you are between 45 years and 39 years old, ask your health care provider if you should take aspirin to prevent strokes.  Have regular diabetes screenings. This involves taking a blood sample to check your fasting blood sugar level.  If you are at a normal weight and have a low risk for diabetes,  have this test once every three years after 33 years of age.  If you are overweight and have a high risk for diabetes, consider being tested at a younger age or more often. PREVENTING INFECTION  Hepatitis B  If you have a higher risk for hepatitis B, you should be screened for this virus. You are considered at high risk for hepatitis B if:  You were born in a country where hepatitis B is common. Ask your health care provider which countries are considered high risk.  Your parents were born in a high-risk country, and you have not been immunized against hepatitis B (hepatitis B vaccine).  You have HIV or AIDS.  You use needles to inject street drugs.  You live with someone who has hepatitis B.  You have had sex with someone who has hepatitis B.  You get hemodialysis treatment.  You take certain medicines for conditions, including cancer, organ transplantation, and autoimmune conditions. Hepatitis C  Blood testing is recommended for:  Everyone born from [birth year] through 1964.  Anyone with known risk factors for hepatitis C. Sexually transmitted infections (STIs)  You should be screened for sexually transmitted infections (STIs) including gonorrhea and chlamydia if:  You are sexually active and are younger than 33 years of age.  You are older than 33 years of age and your health care provider tells you that you are at risk for this type of infection.  Your sexual activity has changed since you were last screened and you are at an increased risk for chlamydia or gonorrhea. Ask your health care provider if you are at risk.  If you do not have HIV, but are at risk, it may be recommended that you take a prescription medicine daily to prevent HIV infection. This is called pre-exposure prophylaxis (PrEP). You are considered at risk if:  You are sexually active and do not regularly use condoms or know the HIV status of your partner(s).  You take drugs by injection.  You are sexually  active with a partner who has HIV. Talk with your health care provider about whether you are at high risk of being infected with HIV. If you choose to begin PrEP, you should first be tested for HIV. You should then be tested every 3 months for as long as you are taking PrEP.  PREGNANCY   If you are premenopausal and you may become pregnant, ask your health care provider about preconception counseling.  If you may  become pregnant, take 400 to 800 micrograms (mcg) of folic acid every day.  If you want to prevent pregnancy, talk to your health care provider about birth control (contraception). OSTEOPOROSIS AND MENOPAUSE   Osteoporosis is a disease in which the bones lose minerals and strength with aging. This can result in serious bone fractures. Your risk for osteoporosis can be identified using a bone density scan.  If you are 61 years of age or older, or if you are at risk for osteoporosis and fractures, ask your health care provider if you should be screened.  Ask your health care provider whether you should take a calcium or vitamin D supplement to lower your risk for osteoporosis.  Menopause may have certain physical symptoms and risks.  Hormone replacement therapy may reduce some of these symptoms and risks. Talk to your health care provider about whether hormone replacement therapy is right for you.  HOME CARE INSTRUCTIONS   Schedule regular health, dental, and eye exams.  Stay current with your immunizations.   Do not use any tobacco products including cigarettes, chewing tobacco, or electronic cigarettes.  If you are pregnant, do not drink alcohol.  If you are breastfeeding, limit how much and how often you drink alcohol.  Limit alcohol intake to no more than 1 drink per day for nonpregnant women. One drink equals 12 ounces of beer, 5 ounces of wine, or 1 ounces of hard liquor.  Do not use street drugs.  Do not share needles.  Ask your health care provider for help if  you need support or information about quitting drugs.  Tell your health care provider if you often feel depressed.  Tell your health care provider if you have ever been abused or do not feel safe at home.   This information is not intended to replace advice given to you by your health care provider. Make sure you discuss any questions you have with your health care provider.   Document Released: 08/01/2010 Document Revised: 02/06/2014 Document Reviewed: 12/18/2012 Elsevier Interactive Patient Education [employer].

## 2015-01-05 ENCOUNTER — Encounter: Payer: Self-pay | Admitting: Family

## 2015-01-05 ENCOUNTER — Ambulatory Visit (INDEPENDENT_AMBULATORY_CARE_PROVIDER_SITE_OTHER): Payer: BC Managed Care – PPO | Admitting: Family

## 2015-01-05 VITALS — BP 116/75 | HR 90 | Temp 97.4°F | Ht 62.0 in | Wt 193.4 lb

## 2015-01-05 DIAGNOSIS — M5441 Lumbago with sciatica, right side: Secondary | ICD-10-CM

## 2015-01-05 MED ORDER — CYCLOBENZAPRINE HCL 5 MG PO TABS: 5.0000 mg | ORAL_TABLET | Freq: Three times a day (TID) | ORAL | Status: DC | PRN

## 2015-01-05 MED ORDER — KETOROLAC TROMETHAMINE 60 MG/2ML IM SOLN
60.0000 mg | Freq: Once | INTRAMUSCULAR | Status: AC
  Administered 2015-01-05: 60 mg via INTRAMUSCULAR

## 2015-01-05 MED ORDER — METHYLPREDNISOLONE ACETATE 80 MG/ML IJ SUSP
80.0000 mg | Freq: Once | INTRAMUSCULAR | Status: AC
  Administered 2015-01-05: 80 mg via INTRAMUSCULAR

## 2015-01-05 MED ORDER — NAPROXEN 500 MG PO TABS: 500.0000 mg | ORAL_TABLET | Freq: Two times a day (BID) | ORAL | Status: DC

## 2015-01-05 NOTE — Progress Notes (Signed)
   Subjective:    Patient ID: Kim Klein, female    DOB: 10-31-1980, 34 y.o.   MRN: [id number]  Back Pain This is a new problem. The current episode started 1 to 4 weeks ago. The problem occurs constantly. The problem has been waxing and waning since onset. The pain is present in the gluteal and lumbar spine. The quality of the pain is described as aching. The pain radiates to the right thigh, right foot and right knee. The pain is at a severity of 7/10. The pain is moderate. The symptoms are aggravated by standing and twisting. Associated symptoms include leg pain, numbness, tingling and weakness. Pertinent negatives include no bladder incontinence, bowel incontinence, dysuria or headaches. She has tried NSAIDs for the symptoms. The treatment provided mild relief.      Review of Systems  HENT: Negative.   Eyes: Negative.   Respiratory: Negative.  Negative for shortness of breath.   Cardiovascular: Negative.  Negative for palpitations.  Gastrointestinal: Negative.  Negative for bowel incontinence.  Endocrine: Negative.   Genitourinary: Negative.  Negative for bladder incontinence and dysuria.  Musculoskeletal: Positive for back pain.  Neurological: Positive for tingling, weakness and numbness. Negative for headaches.  Hematological: Negative.   Psychiatric/Behavioral: Negative.   All other systems reviewed and are negative.      Objective:   Physical Exam  Constitutional: She is oriented to person, place, and time. She appears well-developed and well-nourished. No distress.  HENT:  Head: Normocephalic and atraumatic.  Eyes: Pupils are equal, round, and reactive to light.  Neck: Normal range of motion. Neck supple. No thyromegaly present.  Cardiovascular: Normal rate, regular rhythm, normal heart sounds and intact distal pulses.   No murmur heard. Pulmonary/Chest: Effort normal and breath sounds normal. No respiratory distress. She has no wheezes.  Abdominal: Soft. Bowel  sounds are normal. She exhibits no distension. There is no tenderness.  Musculoskeletal: Normal range of motion. She exhibits no edema or tenderness.  Neurological: She is alert and oriented to person, place, and time. She has normal reflexes. No cranial nerve deficit.  Skin: Skin is warm and dry.  Psychiatric: She has a normal mood and affect. Her behavior is normal. Judgment and thought content normal.  Vitals reviewed.     BP 116/75 mmHg  Pulse 90  Temp(Src) 97.4 F (36.3 C) (Oral)  Ht 5\' 2"  (1.575 m)  Wt 193 lb 6.4 oz (87.726 kg)  BMI 35.36 kg/m2     Assessment & Plan:  1. Right-sided low back pain with right-sided sciatica -Rest -Ice and heat as needed -Sedation precautions dicussed -No other NSAID's while taking naprosyn -Exercises discussed -RTO prn  - methylPREDNISolone acetate (DEPO-MEDROL) injection 80 mg; Inject 1 mL (80 mg total) into the muscle once. - ketorolac (TORADOL) injection 60 mg; Inject 2 mLs (60 mg total) into the muscle once. - naproxen (NAPROSYN) 500 MG tablet; Take 1 tablet (500 mg total) by mouth 2 (two) times daily with a meal.  Dispense: 60 tablet; Refill: 0 - cyclobenzaprine (FLEXERIL) 5 MG tablet; Take 1 tablet (5 mg total) by mouth 3 (three) times daily as needed for muscle spasms.  Dispense: 45 tablet; Refill: 0  Jannifer Rodneyhristy Jennika Ringgold, FNP

## 2015-01-05 NOTE — Patient Instructions (Signed)
Sciatica With Rehab The sciatic nerve runs from the back down the leg and is responsible for sensation and control of the muscles in the back (posterior) side of the thigh, lower leg, and foot. Sciatica is a condition that is characterized by inflammation of this nerve.  SYMPTOMS   Signs of nerve damage, including numbness and/or weakness along the posterior side of the lower extremity.  Pain in the back of the thigh that may also travel down the leg.  Pain that worsens when sitting for long periods of time.  Occasionally, pain in the back or buttock. CAUSES  Inflammation of the sciatic nerve is the cause of sciatica. The inflammation is due to something irritating the nerve. Common sources of irritation include:  Sitting for long periods of time.  Direct trauma to the nerve.  Arthritis of the spine.  Herniated or ruptured disk.  Slipping of the vertebrae (spondylolisthesis).  Pressure from soft tissues, such as muscles or ligament-like tissue (fascia). RISK INCREASES WITH:  Sports that place pressure or stress on the spine (football or weightlifting).  Poor strength and flexibility.  Failure to warm up properly before activity.  Family history of low back pain or disk disorders.  Previous back injury or surgery.  Poor body mechanics, especially when lifting, or poor posture. PREVENTION   Warm up and stretch properly before activity.  Maintain physical fitness:  Strength, flexibility, and endurance.  Cardiovascular fitness.  Learn and use proper technique, especially with posture and lifting. When possible, have coach correct improper technique.  Avoid activities that place stress on the spine. PROGNOSIS If treated properly, then sciatica usually resolves within 6 weeks. However, occasionally surgery is necessary.  RELATED COMPLICATIONS   Permanent nerve damage, including pain, numbness, tingle, or weakness.  Chronic back pain.  Risks of surgery: infection,  bleeding, nerve damage, or damage to surrounding tissues. TREATMENT Treatment initially involves resting from any activities that aggravate your symptoms. The use of ice and medication may help reduce pain and inflammation. The use of strengthening and stretching exercises may help reduce pain with activity. These exercises may be performed at home or with referral to a therapist. A therapist may recommend further treatments, such as transcutaneous electronic nerve stimulation (TENS) or ultrasound. Your caregiver may recommend corticosteroid injections to help reduce inflammation of the sciatic nerve. If symptoms persist despite non-surgical (conservative) treatment, then surgery may be recommended. MEDICATION  If pain medication is necessary, then nonsteroidal anti-inflammatory medications, such as aspirin and ibuprofen, or other minor pain relievers, such as acetaminophen, are often recommended.  Do not take pain medication for 7 days before surgery.  Prescription pain relievers may be given if deemed necessary by your caregiver. Use only as directed and only as much as you need.  Ointments applied to the skin may be helpful.  Corticosteroid injections may be given by your caregiver. These injections should be reserved for the most serious cases, because they may only be given a certain number of times. HEAT AND COLD  Cold treatment (icing) relieves pain and reduces inflammation. Cold treatment should be applied for 10 to 15 minutes every 2 to 3 hours for inflammation and pain and immediately after any activity that aggravates your symptoms. Use ice packs or massage the area with a piece of ice (ice massage).  Heat treatment may be used prior to performing the stretching and strengthening activities prescribed by your caregiver, physical therapist, or athletic trainer. Use a heat pack or soak the injury in warm water.   SEEK MEDICAL CARE IF:  Treatment seems to offer no benefit, or the condition  worsens.  Any medications produce adverse side effects. EXERCISES  RANGE OF MOTION (ROM) AND STRETCHING EXERCISES - Sciatica Most people with sciatic will find that their symptoms worsen with either excessive bending forward (flexion) or arching at the low back (extension). The exercises which will help resolve your symptoms will focus on the opposite motion. Your physician, physical therapist or athletic trainer will help you determine which exercises will be most helpful to resolve your low back pain. Do not complete any exercises without first consulting with your clinician. Discontinue any exercises which worsen your symptoms until you speak to your clinician. If you have pain, numbness or tingling which travels down into your buttocks, leg or foot, the goal of the therapy is for these symptoms to move closer to your back and eventually resolve. Occasionally, these leg symptoms will get better, but your low back pain may worsen; this is typically an indication of progress in your rehabilitation. Be certain to be very alert to any changes in your symptoms and the activities in which you participated in the 24 hours prior to the change. Sharing this information with your clinician will allow him/her to most efficiently treat your condition. These exercises may help you when beginning to rehabilitate your injury. Your symptoms may resolve with or without further involvement from your physician, physical therapist or athletic trainer. While completing these exercises, remember:   Restoring tissue flexibility helps normal motion to return to the joints. This allows healthier, less painful movement and activity.  An effective stretch should be held for at least 30 seconds.  A stretch should never be painful. You should only feel a gentle lengthening or release in the stretched tissue. FLEXION RANGE OF MOTION AND STRETCHING EXERCISES: STRETCH - Flexion, Single Knee to Chest   Lie on a firm bed or floor  with both legs extended in front of you.  Keeping one leg in contact with the floor, bring your opposite knee to your chest. Hold your leg in place by either grabbing behind your thigh or at your knee.  Pull until you feel a gentle stretch in your low back. Hold __________ seconds.  Slowly release your grasp and repeat the exercise with the opposite side. Repeat __________ times. Complete this exercise __________ times per day.  STRETCH - Flexion, Double Knee to Chest  Lie on a firm bed or floor with both legs extended in front of you.  Keeping one leg in contact with the floor, bring your opposite knee to your chest.  Tense your stomach muscles to support your back and then lift your other knee to your chest. Hold your legs in place by either grabbing behind your thighs or at your knees.  Pull both knees toward your chest until you feel a gentle stretch in your low back. Hold __________ seconds.  Tense your stomach muscles and slowly return one leg at a time to the floor. Repeat __________ times. Complete this exercise __________ times per day.  STRETCH - Low Trunk Rotation   Lie on a firm bed or floor. Keeping your legs in front of you, bend your knees so they are both pointed toward the ceiling and your feet are flat on the floor.  Extend your arms out to the side. This will stabilize your upper body by keeping your shoulders in contact with the floor.  Gently and slowly drop both knees together to one side until   you feel a gentle stretch in your low back. Hold for __________ seconds.  Tense your stomach muscles to support your low back as you bring your knees back to the starting position. Repeat the exercise to the other side. Repeat __________ times. Complete this exercise __________ times per day  EXTENSION RANGE OF MOTION AND FLEXIBILITY EXERCISES: STRETCH - Extension, Prone on Elbows  Lie on your stomach on the floor, a bed will be too soft. Place your palms about shoulder  width apart and at the height of your head.  Place your elbows under your shoulders. If this is too painful, stack pillows under your chest.  Allow your body to relax so that your hips drop lower and make contact more completely with the floor.  Hold this position for __________ seconds.  Slowly return to lying flat on the floor. Repeat __________ times. Complete this exercise __________ times per day.  RANGE OF MOTION - Extension, Prone Press Ups  Lie on your stomach on the floor, a bed will be too soft. Place your palms about shoulder width apart and at the height of your head.  Keeping your back as relaxed as possible, slowly straighten your elbows while keeping your hips on the floor. You may adjust the placement of your hands to maximize your comfort. As you gain motion, your hands will come more underneath your shoulders.  Hold this position __________ seconds.  Slowly return to lying flat on the floor. Repeat __________ times. Complete this exercise __________ times per day.  STRENGTHENING EXERCISES - Sciatica  These exercises may help you when beginning to rehabilitate your injury. These exercises should be done near your "sweet spot." This is the neutral, low-back arch, somewhere between fully rounded and fully arched, that is your least painful position. When performed in this safe range of motion, these exercises can be used for people who have either a flexion or extension based injury. These exercises may resolve your symptoms with or without further involvement from your physician, physical therapist or athletic trainer. While completing these exercises, remember:   Muscles can gain both the endurance and the strength needed for everyday activities through controlled exercises.  Complete these exercises as instructed by your physician, physical therapist or athletic trainer. Progress with the resistance and repetition exercises only as your caregiver advises.  You may  experience muscle soreness or fatigue, but the pain or discomfort you are trying to eliminate should never worsen during these exercises. If this pain does worsen, stop and make certain you are following the directions exactly. If the pain is still present after adjustments, discontinue the exercise until you can discuss the trouble with your clinician. STRENGTHENING - Deep Abdominals, Pelvic Tilt   Lie on a firm bed or floor. Keeping your legs in front of you, bend your knees so they are both pointed toward the ceiling and your feet are flat on the floor.  Tense your lower abdominal muscles to press your low back into the floor. This motion will rotate your pelvis so that your tail bone is scooping upwards rather than pointing at your feet or into the floor.  With a gentle tension and even breathing, hold this position for __________ seconds. Repeat __________ times. Complete this exercise __________ times per day.  STRENGTHENING - Abdominals, Crunches   Lie on a firm bed or floor. Keeping your legs in front of you, bend your knees so they are both pointed toward the ceiling and your feet are flat on the   floor. Cross your arms over your chest.  Slightly tip your chin down without bending your neck.  Tense your abdominals and slowly lift your trunk high enough to just clear your shoulder blades. Lifting higher can put excessive stress on the low back and does not further strengthen your abdominal muscles.  Control your return to the starting position. Repeat __________ times. Complete this exercise __________ times per day.  STRENGTHENING - Quadruped, Opposite UE/LE Lift  Assume a hands and knees position on a firm surface. Keep your hands under your shoulders and your knees under your hips. You may place padding under your knees for comfort.  Find your neutral spine and gently tense your abdominal muscles so that you can maintain this position. Your shoulders and hips should form a rectangle  that is parallel with the floor and is not twisted.  Keeping your trunk steady, lift your right hand no higher than your shoulder and then your left leg no higher than your hip. Make sure you are not holding your breath. Hold this position __________ seconds.  Continuing to keep your abdominal muscles tense and your back steady, slowly return to your starting position. Repeat with the opposite arm and leg. Repeat __________ times. Complete this exercise __________ times per day.  STRENGTHENING - Abdominals and Quadriceps, Straight Leg Raise   Lie on a firm bed or floor with both legs extended in front of you.  Keeping one leg in contact with the floor, bend the other knee so that your foot can rest flat on the floor.  Find your neutral spine, and tense your abdominal muscles to maintain your spinal position throughout the exercise.  Slowly lift your straight leg off the floor about 6 inches for a count of 15, making sure to not hold your breath.  Still keeping your neutral spine, slowly lower your leg all the way to the floor. Repeat this exercise with each leg __________ times. Complete this exercise __________ times per day. POSTURE AND BODY MECHANICS CONSIDERATIONS - Sciatica Keeping correct posture when sitting, standing or completing your activities will reduce the stress put on different body tissues, allowing injured tissues a chance to heal and limiting painful experiences. The following are general guidelines for improved posture. Your physician or physical therapist will provide you with any instructions specific to your needs. While reading these guidelines, remember:  The exercises prescribed by your provider will help you have the flexibility and strength to maintain correct postures.  The correct posture provides the optimal environment for your joints to work. All of your joints have less wear and tear when properly supported by a spine with good posture. This means you will  experience a healthier, less painful body.  Correct posture must be practiced with all of your activities, especially prolonged sitting and standing. Correct posture is as important when doing repetitive low-stress activities (typing) as it is when doing a single heavy-load activity (lifting). RESTING POSITIONS Consider which positions are most painful for you when choosing a resting position. If you have pain with flexion-based activities (sitting, bending, stooping, squatting), choose a position that allows you to rest in a less flexed posture. You would want to avoid curling into a fetal position on your side. If your pain worsens with extension-based activities (prolonged standing, working overhead), avoid resting in an extended position such as sleeping on your stomach. Most people will find more comfort when they rest with their spine in a more neutral position, neither too rounded nor too   arched. Lying on a non-sagging bed on your side with a pillow between your knees, or on your back with a pillow under your knees will often provide some relief. Keep in mind, being in any one position for a prolonged period of time, no matter how correct your posture, can still lead to stiffness. PROPER SITTING POSTURE In order to minimize stress and discomfort on your spine, you must sit with correct posture Sitting with good posture should be effortless for a healthy body. Returning to good posture is a gradual process. Many people can work toward this most comfortably by using various supports until they have the flexibility and strength to maintain this posture on their own. When sitting with proper posture, your ears will fall over your shoulders and your shoulders will fall over your hips. You should use the back of the chair to support your upper back. Your low back will be in a neutral position, just slightly arched. You may place a small pillow or folded towel at the base of your low back for support.  When  working at a desk, create an environment that supports good, upright posture. Without extra support, muscles fatigue and lead to excessive strain on joints and other tissues. Keep these recommendations in mind: CHAIR:   A chair should be able to slide under your desk when your back makes contact with the back of the chair. This allows you to work closely.  The chair's height should allow your eyes to be level with the upper part of your monitor and your hands to be slightly lower than your elbows. BODY POSITION  Your feet should make contact with the floor. If this is not possible, use a foot rest.  Keep your ears over your shoulders. This will reduce stress on your neck and low back. INCORRECT SITTING POSTURES   If you are feeling tired and unable to assume a healthy sitting posture, do not slouch or slump. This puts excessive strain on your back tissues, causing more damage and pain. Healthier options include:  Using more support, like a lumbar pillow.  Switching tasks to something that requires you to be upright or walking.  Talking a brief walk.  Lying down to rest in a neutral-spine position. PROLONGED STANDING WHILE SLIGHTLY LEANING FORWARD  When completing a task that requires you to lean forward while standing in one place for a long time, place either foot up on a stationary 2-4 inch high object to help maintain the best posture. When both feet are on the ground, the low back tends to lose its slight inward curve. If this curve flattens (or becomes too large), then the back and your other joints will experience too much stress, fatigue more quickly and can cause pain.  CORRECT STANDING POSTURES Proper standing posture should be assumed with all daily activities, even if they only take a few moments, like when brushing your teeth. As in sitting, your ears should fall over your shoulders and your shoulders should fall over your hips. You should keep a slight tension in your abdominal  muscles to brace your spine. Your tailbone should point down to the ground, not behind your body, resulting in an over-extended swayback posture.  INCORRECT STANDING POSTURES  Common incorrect standing postures include a forward head, locked knees and/or an excessive swayback. WALKING Walk with an upright posture. Your ears, shoulders and hips should all line-up. PROLONGED ACTIVITY IN A FLEXED POSITION When completing a task that requires you to bend forward   at your waist or lean over a low surface, try to find a way to stabilize 3 of 4 of your limbs. You can place a hand or elbow on your thigh or rest a knee on the surface you are reaching across. This will provide you more stability so that your muscles do not fatigue as quickly. By keeping your knees relaxed, or slightly bent, you will also reduce stress across your low back. CORRECT LIFTING TECHNIQUES DO :   Assume a wide stance. This will provide you more stability and the opportunity to get as close as possible to the object which you are lifting.  Tense your abdominals to brace your spine; then bend at the knees and hips. Keeping your back locked in a neutral-spine position, lift using your leg muscles. Lift with your legs, keeping your back straight.  Test the weight of unknown objects before attempting to lift them.  Try to keep your elbows locked down at your sides in order get the best strength from your shoulders when carrying an object.  Always ask for help when lifting heavy or awkward objects. INCORRECT LIFTING TECHNIQUES DO NOT:   Lock your knees when lifting, even if it is a small object.  Bend and twist. Pivot at your feet or move your feet when needing to change directions.  Assume that you cannot safely pick up a paperclip without proper posture.   This information is not intended to replace advice given to you by your health care provider. Make sure you discuss any questions you have with your health care provider.     Document Released: 01/16/2005 Document Revised: 06/02/2014 Document Reviewed: 04/30/2008 [employer] Interactive Patient Education 2016 [employer]  

## 2015-03-04 ENCOUNTER — Encounter: Payer: Self-pay | Admitting: Family

## 2015-03-04 ENCOUNTER — Ambulatory Visit (INDEPENDENT_AMBULATORY_CARE_PROVIDER_SITE_OTHER): Payer: BC Managed Care – PPO | Admitting: Family

## 2015-03-04 VITALS — BP 132/95 | HR 92 | Temp 97.8°F | Ht 62.0 in | Wt 193.8 lb

## 2015-03-04 DIAGNOSIS — J0100 Acute maxillary sinusitis, unspecified: Secondary | ICD-10-CM

## 2015-03-04 MED ORDER — LEVOFLOXACIN 500 MG PO TABS: 500.0000 mg | ORAL_TABLET | Freq: Every day | ORAL | Status: DC

## 2015-03-04 NOTE — Progress Notes (Signed)
Subjective:    Patient ID: Kim Klein, female    DOB: 1980-09-30, 35 y.o.   MRN: [id number]  Sinus Problem This is a new problem. The current episode started in the past 7 days. The problem has been gradually worsening since onset. There has been no fever. Her pain is at a severity of 10/10. The pain is mild. Associated symptoms include chills, congestion, coughing, ear pain (left), headaches, a hoarse voice, sinus pressure, sneezing and a sore throat. Pertinent negatives include no shortness of breath. Past treatments include lying down, oral decongestants and saline sprays. The treatment provided mild relief.      Review of Systems  Constitutional: Positive for chills.  HENT: Positive for congestion, ear pain (left), hoarse voice, sinus pressure, sneezing and sore throat.   Eyes: Negative.   Respiratory: Positive for cough. Negative for shortness of breath.   Cardiovascular: Negative.  Negative for palpitations.  Gastrointestinal: Negative.   Endocrine: Negative.   Genitourinary: Negative.   Musculoskeletal: Negative.   Neurological: Positive for headaches.  Hematological: Negative.   Psychiatric/Behavioral: Negative.   All other systems reviewed and are negative.      Objective:   Physical Exam  Constitutional: She is oriented to person, place, and time. She appears well-developed and well-nourished. No distress.  HENT:  Head: Normocephalic and atraumatic.  Right Ear: There is swelling and tenderness. Tympanic membrane is erythematous.  Left Ear: There is swelling and tenderness.  Nose: Right sinus exhibits maxillary sinus tenderness. Right sinus exhibits no frontal sinus tenderness. Left sinus exhibits maxillary sinus tenderness. Left sinus exhibits no frontal sinus tenderness.  Nasal passage erythemas with mild swelling  Oropharynx erythemas  Eyes: Pupils are equal, round, and reactive to light.  Neck: Normal range of motion. Neck supple. No thyromegaly present.    Cardiovascular: Normal rate, regular rhythm, normal heart sounds and intact distal pulses.   No murmur heard. Pulmonary/Chest: Effort normal and breath sounds normal. No respiratory distress. She has no wheezes.  Abdominal: Soft. Bowel sounds are normal. She exhibits no distension. There is no tenderness.  Musculoskeletal: Normal range of motion. She exhibits no edema or tenderness.  Neurological: She is alert and oriented to person, place, and time. She has normal reflexes. No cranial nerve deficit.  Skin: Skin is warm and dry.  Psychiatric: She has a normal mood and affect. Her behavior is normal. Judgment and thought content normal.  Vitals reviewed.     BP 132/95 mmHg  Pulse 92  Temp(Src) 97.8 F (36.6 C) (Oral)  Ht  (1.575 m)  Wt 193 lb 12.8 oz (87.907 kg)  BMI 35.44 kg/m2     Assessment & Plan:  1. Acute maxillary sinusitis, recurrence not specified -- Take meds as prescribed - Use a cool mist humidifier  -Use saline nose sprays frequently -Saline irrigations of the nose can be very helpful if done frequently.  * 4X daily for 1 week*  * Use of a nettie pot can be helpful with this. Follow directions with this* -Force fluids -For any cough or congestion  Use plain Mucinex- regular strength or max strength is fine   * Children- consult with Pharmacist for dosing -For fever or aces or pains- take tylenol or ibuprofen appropriate for age and weight.  * for fevers greater than 101 orally you may alternate ibuprofen and tylenol every  3 hours. -Throat lozenges if help - levofloxacin (LEVAQUIN) 500 MG tablet; Take 1 tablet (500 mg total) by mouth daily.  Dispense:  7 tablet; Refill: 0  Jannifer Rodney, FNP -

## 2015-03-04 NOTE — Patient Instructions (Signed)
Sinusitis, Adult Sinusitis is redness, soreness, and inflammation of the paranasal sinuses. Paranasal sinuses are air pockets within the bones of your face. They are located beneath your eyes, in the middle of your forehead, and above your eyes. In healthy paranasal sinuses, mucus is able to drain out, and air is able to circulate through them by way of your nose. However, when your paranasal sinuses are inflamed, mucus and air can become trapped. This can allow bacteria and other germs to grow and cause infection. Sinusitis can develop quickly and last only a short time (acute) or continue over a long period (chronic). Sinusitis that lasts for more than 12 weeks is considered chronic. CAUSES Causes of sinusitis include:  Allergies.  Structural abnormalities, such as displacement of the cartilage that separates your nostrils (deviated septum), which can decrease the air flow through your nose and sinuses and affect sinus drainage.  Functional abnormalities, such as when the small hairs (cilia) that line your sinuses and help remove mucus do not work properly or are not present. SIGNS AND SYMPTOMS Symptoms of acute and chronic sinusitis are the same. The primary symptoms are pain and pressure around the affected sinuses. Other symptoms include:  Upper toothache.  Earache.  Headache.  Bad breath.  Decreased sense of smell and taste.  A cough, which worsens when you are lying flat.  Fatigue.  Fever.  Thick drainage from your nose, which often is green and may contain pus (purulent).  Swelling and warmth over the affected sinuses. DIAGNOSIS Your health care provider will perform a physical exam. During your exam, your health care provider may perform any of the following to help determine if you have acute sinusitis or chronic sinusitis:  Look in your nose for signs of abnormal growths in your nostrils (nasal polyps).  Tap over the affected sinus to check for signs of  infection.  View the inside of your sinuses using an imaging device that has a light attached (endoscope). If your health care provider suspects that you have chronic sinusitis, one or more of the following tests may be recommended:  Allergy tests.  Nasal culture. A sample of mucus is taken from your nose, sent to a lab, and screened for bacteria.  Nasal cytology. A sample of mucus is taken from your nose and examined by your health care provider to determine if your sinusitis is related to an allergy. TREATMENT Most cases of acute sinusitis are related to a viral infection and will resolve on their own within 10 days. Sometimes, medicines are prescribed to help relieve symptoms of both acute and chronic sinusitis. These may include pain medicines, decongestants, nasal steroid sprays, or saline sprays. However, for sinusitis related to a bacterial infection, your health care provider will prescribe antibiotic medicines. These are medicines that will help kill the bacteria causing the infection. Rarely, sinusitis is caused by a fungal infection. In these cases, your health care provider will prescribe antifungal medicine. For some cases of chronic sinusitis, surgery is needed. Generally, these are cases in which sinusitis recurs more than 3 times per year, despite other treatments. HOME CARE INSTRUCTIONS  Drink plenty of water. Water helps thin the mucus so your sinuses can drain more easily.  Use a humidifier.  Inhale steam 3-4 times a day (for example, sit in the bathroom with the shower running).  Apply a warm, moist washcloth to your face 3-4 times a day, or as directed by your health care provider.  Use saline nasal sprays to help   moisten and clean your sinuses.  Take medicines only as directed by your health care provider.  If you were prescribed either an antibiotic or antifungal medicine, finish it all even if you start to feel better. SEEK IMMEDIATE MEDICAL CARE IF:  You have  increasing pain or severe headaches.  You have nausea, vomiting, or drowsiness.  You have swelling around your face.  You have vision problems.  You have a stiff neck.  You have difficulty breathing.   This information is not intended to replace advice given to you by your health care provider. Make sure you discuss any questions you have with your health care provider.   Document Released: 01/16/2005 Document Revised: 02/06/2014 Document Reviewed: 01/31/2011 Elsevier Interactive Patient Education 2016 [employer]  - Take meds as prescribed - Use a cool mist humidifier  -Use saline nose sprays frequently -Saline irrigations of the nose can be very helpful if done frequently.  * 4X daily for 1 week*  * Use of a nettie pot can be helpful with this. Follow directions with this* -Force fluids -For any cough or congestion  Use plain Mucinex- regular strength or max strength is fine   * Children- consult with Pharmacist for dosing -For fever or aces or pains- take tylenol or ibuprofen appropriate for age and weight.  * for fevers greater than 101 orally you may alternate ibuprofen and tylenol every  3 hours. -Throat lozenges if help   Airianna Kreischer, FNP   

## 2015-06-29 ENCOUNTER — Ambulatory Visit (INDEPENDENT_AMBULATORY_CARE_PROVIDER_SITE_OTHER): Payer: BC Managed Care – PPO | Admitting: Family

## 2015-06-29 ENCOUNTER — Encounter: Payer: Self-pay | Admitting: Family

## 2015-06-29 VITALS — BP 125/87 | HR 99 | Temp 97.2°F | Ht 62.0 in | Wt 189.4 lb

## 2015-06-29 DIAGNOSIS — F411 Generalized anxiety disorder: Secondary | ICD-10-CM

## 2015-06-29 DIAGNOSIS — E669 Obesity, unspecified: Secondary | ICD-10-CM

## 2015-06-29 DIAGNOSIS — Z6841 Body Mass Index (BMI) 40.0 and over, adult: Secondary | ICD-10-CM | POA: Insufficient documentation

## 2015-06-29 MED ORDER — ESCITALOPRAM OXALATE 10 MG PO TABS: 10.0000 mg | ORAL_TABLET | Freq: Every day | ORAL | Status: DC

## 2015-06-29 NOTE — Patient Instructions (Signed)
Generalized Anxiety Disorder Generalized anxiety disorder (GAD) is a mental disorder. It interferes with life functions, including relationships, work, and school. GAD is different from normal anxiety, which everyone experiences at some point in their lives in response to specific life events and activities. Normal anxiety actually helps us prepare for and get through these life events and activities. Normal anxiety goes away after the event or activity is over.  GAD causes anxiety that is not necessarily related to specific events or activities. It also causes excess anxiety in proportion to specific events or activities. The anxiety associated with GAD is also difficult to control. GAD can vary from mild to severe. People with severe GAD can have intense waves of anxiety with physical symptoms (panic attacks).  SYMPTOMS The anxiety and worry associated with GAD are difficult to control. This anxiety and worry are related to many life events and activities and also occur more days than not for 6 months or longer. People with GAD also have three or more of the following symptoms (one or more in children):  Restlessness.   Fatigue.  Difficulty concentrating.   Irritability.  Muscle tension.  Difficulty sleeping or unsatisfying sleep. DIAGNOSIS GAD is diagnosed through an assessment by your health care provider. Your health care provider will ask you questions aboutyour mood,physical symptoms, and events in your life. Your health care provider may ask you about your medical history and use of alcohol or drugs, including prescription medicines. Your health care provider may also do a physical exam and blood tests. Certain medical conditions and the use of certain substances can cause symptoms similar to those associated with GAD. Your health care provider may refer you to a mental health specialist for further evaluation. TREATMENT The following therapies are usually used to treat GAD:    Medication. Antidepressant medication usually is prescribed for long-term daily control. Antianxiety medicines may be added in severe cases, especially when panic attacks occur.   Talk therapy (psychotherapy). Certain types of talk therapy can be helpful in treating GAD by providing support, education, and guidance. A form of talk therapy called cognitive behavioral therapy can teach you healthy ways to think about and react to daily life events and activities.  Stress managementtechniques. These include yoga, meditation, and exercise and can be very helpful when they are practiced regularly. A mental health specialist can help determine which treatment is best for you. Some people see improvement with one therapy. However, other people require a combination of therapies.   This information is not intended to replace advice given to you by your health care provider. Make sure you discuss any questions you have with your health care provider.   Document Released: 05/13/2012 Document Revised: 02/06/2014 Document Reviewed: 05/13/2012 Elsevier Interactive Patient Education 2016 [employer]  

## 2015-06-29 NOTE — Progress Notes (Signed)
   Subjective:    Patient ID: Kim Klein, female    DOB: Aug 05, 1980, 35 y.o.   MRN: [id number]  Anxiety Presents for initial visit. Onset was 1 to 5 years ago. The problem has been gradually worsening. Symptoms include decreased concentration, depressed mood, excessive worry, insomnia, irritability, muscle tension, nervous/anxious behavior, palpitations, panic and restlessness. Patient reports no dizziness or dry mouth. Symptoms occur most days. The symptoms are aggravated by social activities and work stress. The quality of sleep is fair. Nighttime awakenings: none.   Her past medical history is significant for anxiety/panic attacks and depression. Past treatments include nothing. The treatment provided no relief.      Review of Systems  Constitutional: Positive for irritability.  Cardiovascular: Positive for palpitations.  Neurological: Negative for dizziness.  Psychiatric/Behavioral: Positive for decreased concentration. The patient is nervous/anxious and has insomnia.   All other systems reviewed and are negative.      Objective:   Physical Exam  Constitutional: She is oriented to person, place, and time. She appears well-developed and well-nourished. No distress.  HENT:  Head: Normocephalic and atraumatic.  Right Ear: External ear normal.  Mouth/Throat: Oropharynx is clear and moist.  Eyes: Pupils are equal, round, and reactive to light.  Neck: Normal range of motion. Neck supple. No thyromegaly present.  Cardiovascular: Normal rate, regular rhythm, normal heart sounds and intact distal pulses.   No murmur heard. Pulmonary/Chest: Effort normal and breath sounds normal. No respiratory distress. She has no wheezes.  Abdominal: Soft. Bowel sounds are normal. She exhibits no distension. There is no tenderness.  Musculoskeletal: Normal range of motion. She exhibits no edema or tenderness.  Neurological: She is alert and oriented to person, place, and time.  Skin: Skin is  warm and dry.  Psychiatric: She has a normal mood and affect. Her behavior is normal. Judgment and thought content normal.  Vitals reviewed.   BP 125/87 mmHg  Pulse 99  Temp(Src) 97.2 F (36.2 C) (Oral)  Ht 5\' 2"  (1.575 m)  Wt 189 lb 6.4 oz (85.911 kg)  BMI 34.63 kg/m2       Assessment & Plan:  1. GAD (generalized anxiety disorder) -Pt started lexapro 10 mg  -Stress management discussed -RTO in 4 weeks for pap and recheck GAD - escitalopram (LEXAPRO) 10 MG tablet; Take 1 tablet (10 mg total) by mouth daily.  Dispense: 90 tablet; Refill: 3  2. Obesity (BMI 30-39.9)  Jannifer Rodneyhristy Annai Heick, FNP

## 2015-07-07 ENCOUNTER — Other Ambulatory Visit: Payer: Self-pay | Admitting: Family

## 2015-08-05 ENCOUNTER — Ambulatory Visit (INDEPENDENT_AMBULATORY_CARE_PROVIDER_SITE_OTHER): Payer: BC Managed Care – PPO | Admitting: Family

## 2015-08-05 ENCOUNTER — Encounter: Payer: Self-pay | Admitting: Family

## 2015-08-05 VITALS — BP 108/73 | HR 75 | Temp 97.7°F | Ht 62.0 in | Wt 192.4 lb

## 2015-08-05 DIAGNOSIS — F411 Generalized anxiety disorder: Secondary | ICD-10-CM

## 2015-08-05 DIAGNOSIS — Z0000 Encounter for general adult medical examination without abnormal findings: Secondary | ICD-10-CM

## 2015-08-05 DIAGNOSIS — E669 Obesity, unspecified: Secondary | ICD-10-CM

## 2015-08-05 DIAGNOSIS — Z803 Family history of malignant neoplasm of breast: Secondary | ICD-10-CM

## 2015-08-05 DIAGNOSIS — E282 Polycystic ovarian syndrome: Secondary | ICD-10-CM

## 2015-08-05 DIAGNOSIS — Z01419 Encounter for gynecological examination (general) (routine) without abnormal findings: Secondary | ICD-10-CM

## 2015-08-05 NOTE — Progress Notes (Signed)
Subjective:    Patient ID: Kim Klein, female    DOB: Jan 22, 1981, 35 y.o.   MRN: [id number]  Pt presents to the office today for CPE with pap. Pt denies any headache, palpitations, SOB, or edema at this time. Pt states her maternal grandmother has breast cancer and all of her maternal grandmother's sisters have breast cancer. PT has paperwork today for genetic testing.  Gynecologic Exam Pertinent negatives include no headaches.  Anxiety Presents for follow-up visit. Onset was 1 to 5 years ago. The problem has been waxing and waning. Patient reports no decreased concentration, depressed mood, excessive worry, impotence, irritability, nervous/anxious behavior, palpitations or shortness of breath. Symptoms occur occasionally. The severity of symptoms is mild. The symptoms are aggravated by social activities. The quality of sleep is good.   Past treatments include SSRIs. The treatment provided significant relief.  PCOS Pt was taking metformin 500 mg BID, but states she was having diarrhea and has not taken any over the last two months.     Review of Systems  Constitutional: Negative.  Negative for irritability.  HENT: Negative.   Eyes: Negative.   Respiratory: Negative.  Negative for shortness of breath.   Cardiovascular: Negative.  Negative for palpitations.  Gastrointestinal: Negative.   Endocrine: Negative.   Genitourinary: Negative.  Negative for impotence.  Musculoskeletal: Negative.   Neurological: Negative.  Negative for headaches.  Hematological: Negative.   Psychiatric/Behavioral: Negative.  Negative for decreased concentration. The patient is not nervous/anxious.   All other systems reviewed and are negative.      Objective:   Physical Exam  Constitutional: She is oriented to person, place, and time. She appears well-developed and well-nourished. No distress.  HENT:  Head: Normocephalic and atraumatic.  Right Ear: External ear normal.  Left Ear: External ear  normal.  Nose: Nose normal.  Mouth/Throat: Oropharynx is clear and moist.  Eyes: Pupils are equal, round, and reactive to light.  Neck: Normal range of motion. Neck supple. No thyromegaly present.  Cardiovascular: Normal rate, regular rhythm, normal heart sounds and intact distal pulses.   No murmur heard. Pulmonary/Chest: Effort normal and breath sounds normal. No respiratory distress. She has no wheezes. Right breast exhibits no inverted nipple, no mass, no nipple discharge, no skin change and no tenderness. Left breast exhibits no inverted nipple, no mass, no nipple discharge, no skin change and no tenderness. Breasts are symmetrical.  Abdominal: Soft. Bowel sounds are normal. She exhibits no distension. There is no tenderness.  Genitourinary: Vagina normal.  Bimanual exam- no adnexal masses or tenderness, ovaries nonpalpable   Cervix parous and pink- No discharge   Musculoskeletal: Normal range of motion. She exhibits no edema or tenderness.  Neurological: She is alert and oriented to person, place, and time. She has normal reflexes. No cranial nerve deficit.  Skin: Skin is warm and dry.  Psychiatric: She has a normal mood and affect. Her behavior is normal. Judgment and thought content normal.  Vitals reviewed.     BP 108/73 mmHg  Pulse 75  Temp(Src) 97.7 F (36.5 C) (Oral)  Ht _0  (1.575 m)  Wt 192 lb 6.4 oz (87.272 kg)  BMI 35.18 kg/m2  LMP 07/30/2015     Assessment & Plan:  1. GAD (generalized anxiety disorder) - CMP14+EGFR  2. Obesity (BMI 30-39.9) - CMP14+EGFR  3. PCOS (polycystic ovarian syndrome) - CMP14+EGFR  4. Annual physical exam - CMP14+EGFR - Lipid panel - Thyroid Panel With TSH - VITAMIN D 25 Hydroxy (Vit-D Deficiency,  Fractures) - Pap IG w/ reflex to HPV when ASC-U  5. Encounter for routine gynecological examination - CMP14+EGFR - Pap IG w/ reflex to HPV when ASC-U  6. Family history of breast cancer -Discussed in length not to complete  genetic testing that was sent in mail until she see's a provider. I am worried about cost for patient and how legit this testing is she received in mail.  - Ambulatory referral to Oncology     Continue all meds Labs pending Health Maintenance reviewed Diet and exercise encouraged RTO 6 months- 1 year  Evelina Dun, FNP

## 2015-08-05 NOTE — Patient Instructions (Signed)
Health Maintenance, Female Adopting a healthy lifestyle and getting preventive care can go a long way to promote health and wellness. Talk with your health care provider about what schedule of regular examinations is right for you. This is a good chance for you to check in with your provider about disease prevention and staying healthy. In between checkups, there are plenty of things you can do on your own. Experts have done a lot of research about which lifestyle changes and preventive measures are most likely to keep you healthy. Ask your health care provider for more information. WEIGHT AND DIET  Eat a healthy diet  Be sure to include plenty of vegetables, fruits, low-fat dairy products, and lean protein.  Do not eat a lot of foods high in solid fats, added sugars, or salt.  Get regular exercise. This is one of the most important things you can do for your health.  Most adults should exercise for at least 150 minutes each week. The exercise should increase your heart rate and make you sweat (moderate-intensity exercise).  Most adults should also do strengthening exercises at least twice a week. This is in addition to the moderate-intensity exercise.  Maintain a healthy weight  Body mass index (BMI) is a measurement that can be used to identify possible weight problems. It estimates body fat based on height and weight. Your health care provider can help determine your BMI and help you achieve or maintain a healthy weight.  For females 20 years of age and older:   A BMI below 18.5 is considered underweight.  A BMI of 18.5 to 24.9 is normal.  A BMI of 25 to 29.9 is considered overweight.  A BMI of 30 and above is considered obese.  Watch levels of cholesterol and blood lipids  You should start having your blood tested for lipids and cholesterol at 34 years of age, then have this test every 5 years.  You may need to have your cholesterol levels checked more often if:  Your lipid  or cholesterol levels are high.  You are older than 34 years of age.  You are at high risk for heart disease.  CANCER SCREENING   Lung Cancer  Lung cancer screening is recommended for adults 55-80 years old who are at high risk for lung cancer because of a history of smoking.  A yearly low-dose CT scan of the lungs is recommended for people who:  Currently smoke.  Have quit within the past 15 years.  Have at least a 30-pack-year history of smoking. A pack year is smoking an average of one pack of cigarettes a day for 1 year.  Yearly screening should continue until it has been 15 years since you quit.  Yearly screening should stop if you develop a health problem that would prevent you from having lung cancer treatment.  Breast Cancer  Practice breast self-awareness. This means understanding how your breasts normally appear and feel.  It also means doing regular breast self-exams. Let your health care provider know about any changes, no matter how small.  If you are in your 20s or 30s, you should have a clinical breast exam (CBE) by a health care provider every 1-3 years as part of a regular health exam.  If you are 40 or older, have a CBE every year. Also consider having a breast X-ray (mammogram) every year.  If you have a family history of breast cancer, talk to your health care provider about genetic screening.  If you   are at high risk for breast cancer, talk to your health care provider about having an MRI and a mammogram every year.  Breast cancer gene (BRCA) assessment is recommended for women who have family members with BRCA-related cancers. BRCA-related cancers include:  Breast.  Ovarian.  Tubal.  Peritoneal cancers.  Results of the assessment will determine the need for genetic counseling and BRCA1 and BRCA2 testing. Cervical Cancer Your health care provider may recommend that you be screened regularly for cancer of the pelvic organs (ovaries, uterus, and  vagina). This screening involves a pelvic examination, including checking for microscopic changes to the surface of your cervix (Pap test). You may be encouraged to have this screening done every 3 years, beginning at age 21.  For women ages 30-65, health care providers may recommend pelvic exams and Pap testing every 3 years, or they may recommend the Pap and pelvic exam, combined with testing for human papilloma virus (HPV), every 5 years. Some types of HPV increase your risk of cervical cancer. Testing for HPV may also be done on women of any age with unclear Pap test results.  Other health care providers may not recommend any screening for nonpregnant women who are considered low risk for pelvic cancer and who do not have symptoms. Ask your health care provider if a screening pelvic exam is right for you.  If you have had past treatment for cervical cancer or a condition that could lead to cancer, you need Pap tests and screening for cancer for at least 20 years after your treatment. If Pap tests have been discontinued, your risk factors (such as having a new sexual partner) need to be reassessed to determine if screening should resume. Some women have medical problems that increase the chance of getting cervical cancer. In these cases, your health care provider may recommend more frequent screening and Pap tests. Colorectal Cancer  This type of cancer can be detected and often prevented.  Routine colorectal cancer screening usually begins at 34 years of age and continues through 34 years of age.  Your health care provider may recommend screening at an earlier age if you have risk factors for colon cancer.  Your health care provider may also recommend using home test kits to check for hidden blood in the stool.  A small camera at the end of a tube can be used to examine your colon directly (sigmoidoscopy or colonoscopy). This is done to check for the earliest forms of colorectal  cancer.  Routine screening usually begins at age 50.  Direct examination of the colon should be repeated every 5-10 years through 34 years of age. However, you may need to be screened more often if early forms of precancerous polyps or small growths are found. Skin Cancer  Check your skin from head to toe regularly.  Tell your health care provider about any new moles or changes in moles, especially if there is a change in a mole's shape or color.  Also tell your health care provider if you have a mole that is larger than the size of a pencil eraser.  Always use sunscreen. Apply sunscreen liberally and repeatedly throughout the day.  Protect yourself by wearing long sleeves, pants, a wide-brimmed hat, and sunglasses whenever you are outside. HEART DISEASE, DIABETES, AND HIGH BLOOD PRESSURE   High blood pressure causes heart disease and increases the risk of stroke. High blood pressure is more likely to develop in:  People who have blood pressure in the high end   of the normal range (130-139/85-89 mm Hg).  People who are overweight or obese.  People who are African American.  If you are 38-23 years of age, have your blood pressure checked every 3-5 years. If you are 61 years of age or older, have your blood pressure checked every year. You should have your blood pressure measured twice--once when you are at a hospital or clinic, and once when you are not at a hospital or clinic. Record the average of the two measurements. To check your blood pressure when you are not at a hospital or clinic, you can use:  An automated blood pressure machine at a pharmacy.  A home blood pressure monitor.  If you are between 45 years and 39 years old, ask your health care provider if you should take aspirin to prevent strokes.  Have regular diabetes screenings. This involves taking a blood sample to check your fasting blood sugar level.  If you are at a normal weight and have a low risk for diabetes,  have this test once every three years after 34 years of age.  If you are overweight and have a high risk for diabetes, consider being tested at a younger age or more often. PREVENTING INFECTION  Hepatitis B  If you have a higher risk for hepatitis B, you should be screened for this virus. You are considered at high risk for hepatitis B if:  You were born in a country where hepatitis B is common. Ask your health care provider which countries are considered high risk.  Your parents were born in a high-risk country, and you have not been immunized against hepatitis B (hepatitis B vaccine).  You have HIV or AIDS.  You use needles to inject street drugs.  You live with someone who has hepatitis B.  You have had sex with someone who has hepatitis B.  You get hemodialysis treatment.  You take certain medicines for conditions, including cancer, organ transplantation, and autoimmune conditions. Hepatitis C  Blood testing is recommended for:  Everyone born from [birth year] through 1964.  Anyone with known risk factors for hepatitis C. Sexually transmitted infections (STIs)  You should be screened for sexually transmitted infections (STIs) including gonorrhea and chlamydia if:  You are sexually active and are younger than 34 years of age.  You are older than 34 years of age and your health care provider tells you that you are at risk for this type of infection.  Your sexual activity has changed since you were last screened and you are at an increased risk for chlamydia or gonorrhea. Ask your health care provider if you are at risk.  If you do not have HIV, but are at risk, it may be recommended that you take a prescription medicine daily to prevent HIV infection. This is called pre-exposure prophylaxis (PrEP). You are considered at risk if:  You are sexually active and do not regularly use condoms or know the HIV status of your partner(s).  You take drugs by injection.  You are sexually  active with a partner who has HIV. Talk with your health care provider about whether you are at high risk of being infected with HIV. If you choose to begin PrEP, you should first be tested for HIV. You should then be tested every 3 months for as long as you are taking PrEP.  PREGNANCY   If you are premenopausal and you may become pregnant, ask your health care provider about preconception counseling.  If you may  become pregnant, take 400 to 800 micrograms (mcg) of folic acid every day.  If you want to prevent pregnancy, talk to your health care provider about birth control (contraception). OSTEOPOROSIS AND MENOPAUSE   Osteoporosis is a disease in which the bones lose minerals and strength with aging. This can result in serious bone fractures. Your risk for osteoporosis can be identified using a bone density scan.  If you are 61 years of age or older, or if you are at risk for osteoporosis and fractures, ask your health care provider if you should be screened.  Ask your health care provider whether you should take a calcium or vitamin D supplement to lower your risk for osteoporosis.  Menopause may have certain physical symptoms and risks.  Hormone replacement therapy may reduce some of these symptoms and risks. Talk to your health care provider about whether hormone replacement therapy is right for you.  HOME CARE INSTRUCTIONS   Schedule regular health, dental, and eye exams.  Stay current with your immunizations.   Do not use any tobacco products including cigarettes, chewing tobacco, or electronic cigarettes.  If you are pregnant, do not drink alcohol.  If you are breastfeeding, limit how much and how often you drink alcohol.  Limit alcohol intake to no more than 1 drink per day for nonpregnant women. One drink equals 12 ounces of beer, 5 ounces of wine, or 1 ounces of hard liquor.  Do not use street drugs.  Do not share needles.  Ask your health care provider for help if  you need support or information about quitting drugs.  Tell your health care provider if you often feel depressed.  Tell your health care provider if you have ever been abused or do not feel safe at home.   This information is not intended to replace advice given to you by your health care provider. Make sure you discuss any questions you have with your health care provider.   Document Released: 08/01/2010 Document Revised: 02/06/2014 Document Reviewed: 12/18/2012 Elsevier Interactive Patient Education [employer].

## 2015-08-09 ENCOUNTER — Telehealth: Payer: Self-pay | Admitting: *Deleted

## 2015-08-09 LAB — PAP IG W/ RFLX HPV ASCU: PAP Smear Comment: 0

## 2015-08-09 NOTE — Telephone Encounter (Signed)
-----   Message from Junie Spencerhristy A Hawks, FNP sent at 08/09/2015  3:29 PM EDT ----- Pap negative for lesion or malignancy  Pt needs to make sure she gets blood work drawn

## 2015-08-30 ENCOUNTER — Other Ambulatory Visit: Payer: BC Managed Care – PPO

## 2015-08-31 ENCOUNTER — Other Ambulatory Visit: Payer: Self-pay | Admitting: Family

## 2015-08-31 DIAGNOSIS — E785 Hyperlipidemia, unspecified: Secondary | ICD-10-CM

## 2015-08-31 DIAGNOSIS — E559 Vitamin D deficiency, unspecified: Secondary | ICD-10-CM

## 2015-08-31 LAB — CMP14+EGFR
ALK PHOS: 49 IU/L (ref 39–117)
ALT: 11 IU/L (ref 0–32)
AST: 18 IU/L (ref 0–40)
Albumin/Globulin Ratio: 1.7 (ref 1.2–2.2)
Albumin: 4 g/dL (ref 3.5–5.5)
BUN/Creatinine Ratio: 15 (ref 9–23)
BUN: 11 mg/dL (ref 6–20)
Bilirubin Total: <0.2 mg/dL (ref 0.0–1.2)
CALCIUM: 8.6 mg/dL — AB (ref 8.7–10.2)
CO2: 19 mmol/L (ref 18–29)
CREATININE: 0.75 mg/dL (ref 0.57–1.00)
Chloride: 105 mmol/L (ref 96–106)
GFR calc Af Amer: 121 mL/min/{1.73_m2} (ref >59)
GFR, EST NON AFRICAN AMERICAN: 105 mL/min/{1.73_m2} (ref >59)
GLUCOSE: 76 mg/dL (ref 65–99)
Globulin, Total: 2.4 g/dL (ref 1.5–4.5)
Potassium: 4.4 mmol/L (ref 3.5–5.2)
Sodium: 139 mmol/L (ref 134–144)
Total Protein: 6.4 g/dL (ref 6.0–8.5)

## 2015-08-31 LAB — ANEMIA PROFILE B
BASOS ABS: 0 10*3/uL (ref 0.0–0.2)
Basos: 0 %
EOS (ABSOLUTE): 0.1 10*3/uL (ref 0.0–0.4)
Eos: 1 %
FOLATE: 5.9 ng/mL (ref >3.0)
Ferritin: 80 ng/mL (ref 15–150)
Hematocrit: 39.4 % (ref 34.0–46.6)
Hemoglobin: 13.2 g/dL (ref 11.1–15.9)
Immature Grans (Abs): 0 10*3/uL (ref 0.0–0.1)
Immature Granulocytes: 0 %
Iron Saturation: 15 % (ref 15–55)
Iron: 53 ug/dL (ref 27–159)
LYMPHS ABS: 2.4 10*3/uL (ref 0.7–3.1)
Lymphs: 45 %
MCH: 29.2 pg (ref 26.6–33.0)
MCHC: 33.5 g/dL (ref 31.5–35.7)
MCV: 87 fL (ref 79–97)
MONOCYTES: 7 %
Monocytes Absolute: 0.4 10*3/uL (ref 0.1–0.9)
NEUTROS ABS: 2.4 10*3/uL (ref 1.4–7.0)
NEUTROS PCT: 47 %
PLATELETS: 294 10*3/uL (ref 150–379)
RBC: 4.52 x10E6/uL (ref 3.77–5.28)
RDW: 13 % (ref 12.3–15.4)
RETIC CT PCT: 1.4 % (ref 0.6–2.6)
Total Iron Binding Capacity: 346 ug/dL (ref 250–450)
UIBC: 293 ug/dL (ref 131–425)
VITAMIN B 12: 339 pg/mL (ref 211–946)
WBC: 5.2 10*3/uL (ref 3.4–10.8)

## 2015-08-31 LAB — LIPID PANEL
CHOL/HDL RATIO: 4 ratio (ref 0.0–4.4)
CHOLESTEROL TOTAL: 175 mg/dL (ref 100–199)
HDL: 44 mg/dL (ref >39)
LDL CALC: 112 mg/dL — AB (ref 0–99)
TRIGLYCERIDES: 96 mg/dL (ref 0–149)
VLDL Cholesterol Cal: 19 mg/dL (ref 5–40)

## 2015-08-31 LAB — THYROID PANEL WITH TSH
Free Thyroxine Index: 1.9 (ref 1.2–4.9)
T3 UPTAKE RATIO: 21 % — AB (ref 24–39)
T4 TOTAL: 9 ug/dL (ref 4.5–12.0)
TSH: 1.2 u[IU]/mL (ref 0.450–4.500)

## 2015-08-31 LAB — VITAMIN D 25 HYDROXY (VIT D DEFICIENCY, FRACTURES): VIT D 25 HYDROXY: 17.7 ng/mL — AB (ref 30.0–100.0)

## 2015-08-31 MED ORDER — VITAMIN D (ERGOCALCIFEROL) 1.25 MG (50000 UNIT) PO CAPS: 50000.0000 [IU] | ORAL_CAPSULE | ORAL | 3 refills | Status: DC

## 2015-09-14 ENCOUNTER — Other Ambulatory Visit: Payer: Self-pay | Admitting: Family

## 2015-09-14 DIAGNOSIS — Z3041 Encounter for surveillance of contraceptive pills: Secondary | ICD-10-CM

## 2015-12-13 ENCOUNTER — Ambulatory Visit (INDEPENDENT_AMBULATORY_CARE_PROVIDER_SITE_OTHER): Payer: BC Managed Care – PPO | Admitting: Physician Assistant

## 2015-12-13 ENCOUNTER — Encounter: Payer: Self-pay | Admitting: Physician Assistant

## 2015-12-13 VITALS — BP 116/81 | HR 71 | Temp 97.1°F | Ht 62.0 in | Wt 203.2 lb

## 2015-12-13 DIAGNOSIS — E282 Polycystic ovarian syndrome: Secondary | ICD-10-CM | POA: Diagnosis not present

## 2015-12-13 DIAGNOSIS — M5431 Sciatica, right side: Secondary | ICD-10-CM | POA: Diagnosis not present

## 2015-12-13 DIAGNOSIS — B001 Herpesviral vesicular dermatitis: Secondary | ICD-10-CM

## 2015-12-13 DIAGNOSIS — B029 Zoster without complications: Secondary | ICD-10-CM

## 2015-12-13 MED ORDER — CYCLOBENZAPRINE HCL 5 MG PO TABS: 5.0000 mg | ORAL_TABLET | Freq: Three times a day (TID) | ORAL | 1 refills | Status: DC | PRN

## 2015-12-13 MED ORDER — VALACYCLOVIR HCL 1 G PO TABS: 1000.0000 mg | ORAL_TABLET | Freq: Two times a day (BID) | ORAL | 0 refills | Status: DC

## 2015-12-13 MED ORDER — METFORMIN HCL ER 500 MG PO TB24
1000.0000 mg | ORAL_TABLET | Freq: Every day | ORAL | 6 refills | Status: DC
Start: 2015-12-13 — End: 2017-05-25

## 2015-12-13 NOTE — Patient Instructions (Signed)

## 2015-12-14 ENCOUNTER — Telehealth: Payer: Self-pay | Admitting: Pediatrics

## 2015-12-14 ENCOUNTER — Telehealth: Payer: Self-pay | Admitting: Physician Assistant

## 2015-12-14 DIAGNOSIS — M5431 Sciatica, right side: Secondary | ICD-10-CM | POA: Insufficient documentation

## 2015-12-14 DIAGNOSIS — B029 Zoster without complications: Secondary | ICD-10-CM

## 2015-12-14 MED ORDER — GABAPENTIN 100 MG PO CAPS: 100.0000 mg | ORAL_CAPSULE | Freq: Every day | ORAL | 3 refills | Status: DC

## 2015-12-14 NOTE — Telephone Encounter (Signed)
Patient states that it is just right under the eye and it is painful right around jaw. At the eye itches real bad She states the blister is still in the same spot and the swelling has just moved to right under the eye.

## 2015-12-14 NOTE — Telephone Encounter (Signed)
Patient aware that we do no typically test for shingles and to call back if she is still having any problems within the next few days.

## 2015-12-14 NOTE — Telephone Encounter (Signed)
She probably has shingles. She is contagious until it is dried up and needs to avoid contact.  She can have note if needed.  Is she having any pain?

## 2015-12-14 NOTE — Addendum Note (Signed)
Addended by: Tamera PuntWRAY, [place] Scheiderer S on: 12/14/2015 12:15 PM   Modules accepted: Orders

## 2015-12-14 NOTE — Progress Notes (Signed)
BP 116/81   Pulse 71   Temp 97.1 F (36.2 C) (Oral)   Ht 5\' 2"  (1.575 m)   Wt 203 lb 3.2 oz (92.2 kg)   BMI 37.17 kg/m    Subjective:    Patient ID: Kim Klein, female    DOB: Jul 29, 1980, 35 y.o.   MRN: [id number]  HPI: Kim Klein is a 35 y.o. female presenting on 12/13/2015 for Sore (On right side of cheek ); Sciatica; and Discuss going back on Metformin  Area of redness and itching came up 12/12/15 on the right cheek, denies pain.  On 12/13/15 area had cluster of yellow serous filled blisters on the red base. There was more redness and tender. Denis any radiation in the area of the middle branch of the trigeminal nerve.  On 12/14/15 area spread more toward the right eye and pain has increased. Long discussion was held concerning fever blister versus shingles.  At this point the history is more indicative of shingles. Aware that pain can go on and contagious while broken out.    Would like to get back on her metformin for PCOS, was taking immediate release and experiencing diarrhea. 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  Discussed that extend release should help this symptoms.  Finally she has recurrent flare ups of her sciatica on the right side. Uses heat, NSAIDS and in the past took PRN flexeril. Would like to have this on hand.  She understands that it can cause a serotonin syndrome when taken with an SSRI (best friend is a Pharm D).   Relevant past medical, surgical, family and social history reviewed and updated as indicated. Allergies and medications reviewed and updated.  Past Medical History:  Diagnosis Date  . Depression   . PCOS (polycystic ovarian syndrome)     Past Surgical History:  Procedure Laterality Date  . BREAST SURGERY     lump removed    Review of Systems  Constitutional: Negative.  Negative for activity change, chills, fatigue and fever.  HENT: Negative.   Eyes: Negative.   Respiratory: Negative.  Negative for  cough.   Cardiovascular: Negative.  Negative for chest pain.  Gastrointestinal: Negative.  Negative for abdominal pain.  Endocrine: Negative.   Genitourinary: Negative.  Negative for dysuria.  Musculoskeletal: Positive for back pain.  Skin: Positive for color change and rash.  Neurological: Negative.  Negative for facial asymmetry, weakness and light-headedness.  Hematological: Negative for adenopathy.      Medication List       Accurate as of 12/13/15 11:59 PM. Always use your most recent med list.          ALTAVERA 0.15-30 MG-MCG tablet Generic drug:  levonorgestrel-ethinyl estradiol TAKE ONE TABLET EVERY DAY.   cyclobenzaprine 5 MG tablet Commonly known as:  FLEXERIL Take 1 tablet (5 mg total) by mouth 3 (three) times daily as needed for muscle spasms.   escitalopram 10 MG tablet Commonly known as:  LEXAPRO Take 1 tablet (10 mg total) by mouth daily.   gabapentin 100 MG capsule Commonly known as:  NEURONTIN Take 1-3 capsules (100-300 mg total) by mouth at bedtime. May make drowsy, try to take 3 if possible, may titrate up if too sleepy.   metFORMIN 500 MG 24 hr tablet Commonly known as:  GLUCOPHAGE-XR Take 2 tablets (1,000 mg total) by mouth daily with supper.   naproxen 500 MG tablet Commonly known as:  NAPROSYN Take 1 tablet (500 mg total)  by mouth 2 (two) times daily with a meal.   valACYclovir 1000 MG tablet Commonly known as:  VALTREX Take 1 tablet (1,000 mg total) by mouth 2 (two) times daily.   Vitamin D (Ergocalciferol) 50000 units Caps capsule Commonly known as:  DRISDOL Take 1 capsule (50,000 Units total) by mouth every 7 (seven) days.          Objective:    BP 116/81   Pulse 71   Temp 97.1 F (36.2 C) (Oral)   Ht 5\' 2"  (1.575 m)   Wt 203 lb 3.2 oz (92.2 kg)   BMI 37.17 kg/m   Allergies  Allergen Reactions  . Augmentin [Amoxicillin-Pot Clavulanate] Other (See Comments)    Stomach pain and rectal bleeding  . Cinnamon   . Strawberry  Extract     Physical Exam  Constitutional: She is oriented to person, place, and time. She appears well-developed and well-nourished.  HENT:  Head: Normocephalic and atraumatic.  Eyes: Conjunctivae and EOM are normal. Pupils are equal, round, and reactive to light.  Cardiovascular: Normal rate, regular rhythm, normal heart sounds and intact distal pulses.   Pulmonary/Chest: Effort normal and breath sounds normal.  Abdominal: Soft. Bowel sounds are normal.  Neurological: She is alert and oriented to person, place, and time. She has normal reflexes.  Skin: Skin is warm, dry and intact. Lesion and rash noted. Rash is vesicular. She is not diaphoretic. There is erythema.     Right cheek with small red outbreak about 2cm diameter  Psychiatric: She has a normal mood and affect. Her behavior is normal. Judgment and thought content normal.  Nursing note and vitals reviewed.       Assessment & Plan:   1. Fever blister - valACYclovir (VALTREX) 1000 MG tablet; Take 1 tablet (1,000 mg total) by mouth 2 (two) times daily.  Dispense: 20 tablet; Refill: 0  2. PCOS (polycystic ovarian syndrome) - metFORMIN (GLUCOPHAGE-XR) 500 MG 24 hr tablet; Take 2 tablets (1,000 mg total) by mouth daily with supper.  Dispense: 60 tablet; Refill: 6  3. Sciatica of right side - cyclobenzaprine (FLEXERIL) 5 MG tablet; Take 1 tablet (5 mg total) by mouth 3 (three) times daily as needed for muscle spasms.  Dispense: 30 tablet; Refill: 1  4. Herpes zoster without complication - valACYclovir (VALTREX) 1000 MG tablet; Take 1 tablet (1,000 mg total) by mouth 2 (two) times daily.  Dispense: 20 tablet; Refill: 0 - gabapentin (NEURONTIN) 100 MG capsule; Take 1-3 capsules (100-300 mg total) by mouth at bedtime. May make drowsy, try to take 3 if possible, may titrate up if too sleepy.  Dispense: 90 capsule; Refill: 3   Continue all other maintenance medications as listed above.  Follow up plan: Return if symptoms worsen  or fail to improve.  No orders of the defined types were placed in this encounter.   Educational handout given for back exercises  Remus LofflerAngel S. Makaiah Terwilliger PA-C [facility] [address]  [place], [place] [id number] [phone]   12/14/2015, 12:09 PM

## 2015-12-14 NOTE — Telephone Encounter (Signed)
I am going to send gabapentin for nerve pain, needs to take for 2-3 months to assure full healing of the nerve and no long term pain/neuralgia.  I am going into her note form last night to send it.

## 2015-12-14 NOTE — Telephone Encounter (Signed)
Patient called stating that blister on right cheek has now spread to under right eye.

## 2015-12-14 NOTE — Telephone Encounter (Signed)
Patient aware.

## 2015-12-14 NOTE — Telephone Encounter (Signed)
Has it gone onto the eye itself? If so, should make appointment with her eye doctor for monitoring of her eye.

## 2016-01-17 ENCOUNTER — Encounter: Payer: Self-pay | Admitting: Family

## 2016-01-17 ENCOUNTER — Ambulatory Visit (INDEPENDENT_AMBULATORY_CARE_PROVIDER_SITE_OTHER): Payer: BC Managed Care – PPO | Admitting: Family

## 2016-01-17 VITALS — BP 115/82 | HR 77 | Temp 98.6°F | Ht 62.0 in | Wt 204.0 lb

## 2016-01-17 DIAGNOSIS — J0100 Acute maxillary sinusitis, unspecified: Secondary | ICD-10-CM

## 2016-01-17 MED ORDER — DOXYCYCLINE HYCLATE 100 MG PO TABS: 100.0000 mg | ORAL_TABLET | Freq: Two times a day (BID) | ORAL | 0 refills | Status: DC

## 2016-01-17 NOTE — Patient Instructions (Addendum)
Sinusitis, Adult Sinusitis is soreness and inflammation of your sinuses. Sinuses are hollow spaces in the bones around your face. Your sinuses are located:  Around your eyes.  In the middle of your forehead.  Behind your nose.  In your cheekbones. Your sinuses and nasal passages are lined with a stringy fluid (mucus). Mucus normally drains out of your sinuses. When your nasal tissues become inflamed or swollen, the mucus can become trapped or blocked so air cannot flow through your sinuses. This allows bacteria, viruses, and funguses to grow, which leads to infection. Sinusitis can develop quickly and last for 7?10 days (acute) or for more than 12 weeks (chronic). Sinusitis often develops after a cold. What are the causes? This condition is caused by anything that creates swelling in the sinuses or stops mucus from draining, including:  Allergies.  Asthma.  Bacterial or viral infection.  Abnormally shaped bones between the nasal passages.  Nasal growths that contain mucus (nasal polyps).  Narrow sinus openings.  Pollutants, such as chemicals or irritants in the air.  A foreign object stuck in the nose.  A fungal infection. This is rare. What increases the risk? The following factors may make you more likely to develop this condition:  Having allergies or asthma.  Having had a recent cold or respiratory tract infection.  Having structural deformities or blockages in your nose or sinuses.  Having a weak immune system.  Doing a lot of swimming or diving.  Overusing nasal sprays.  Smoking. What are the signs or symptoms? The main symptoms of this condition are pain and a feeling of pressure around the affected sinuses. Other symptoms include:  Upper toothache.  Earache.  Headache.  Bad breath.  Decreased sense of smell and taste.  A cough that may get worse at night.  Fatigue.  Fever.  Thick drainage from your nose. The drainage is often green and it may  contain pus (purulent).  Stuffy nose or congestion.  Postnasal drip. This is when extra mucus collects in the throat or back of the nose.  Swelling and warmth over the affected sinuses.  Sore throat.  Sensitivity to light. How is this diagnosed? This condition is diagnosed based on symptoms, a medical history, and a physical exam. To find out if your condition is acute or chronic, your health care provider may:  Look in your nose for signs of nasal polyps.  Tap over the affected sinus to check for signs of infection.  View the inside of your sinuses using an imaging device that has a light attached (endoscope). If your health care provider suspects that you have chronic sinusitis, you may also:  Be tested for allergies.  Have a sample of mucus taken from your nose (nasal culture) and checked for bacteria.  Have a mucus sample examined to see if your sinusitis is related to an allergy. If your sinusitis does not respond to treatment and it lasts longer than 8 weeks, you may have an MRI or CT scan to check your sinuses. These scans also help to determine how severe your infection is. In rare cases, a bone biopsy may be done to rule out more serious types of fungal sinus disease. How is this treated? Treatment for sinusitis depends on the cause and whether your condition is chronic or acute. If a virus is causing your sinusitis, your symptoms will go away on their own within 10 days. You may be given medicines to relieve your symptoms, including:  Topical nasal decongestants. They   shrink swollen nasal passages and let mucus drain from your sinuses.  Antihistamines. These drugs block inflammation that is triggered by allergies. This can help to ease swelling in your nose and sinuses.  Topical nasal corticosteroids. These are nasal sprays that ease inflammation and swelling in your nose and sinuses.  Nasal saline washes. These rinses can help to get rid of thick mucus in your  nose. If your condition is caused by bacteria, you will be given an antibiotic medicine. If your condition is caused by a fungus, you will be given an antifungal medicine. Surgery may be needed to correct underlying conditions, such as narrow nasal passages. Surgery may also be needed to remove polyps. Follow these instructions at home: Medicines  Take, use, or apply over-the-counter and prescription medicines only as told by your health care provider. These may include nasal sprays.  If you were prescribed an antibiotic medicine, take it as told by your health care provider. Do not stop taking the antibiotic even if you start to feel better. Hydrate and Humidify  Drink enough water to keep your urine clear or pale yellow. Staying hydrated will help to thin your mucus.  Use a cool mist humidifier to keep the humidity level in your home above 50%.  Inhale steam for 10-15 minutes, 3-4 times a day or as told by your health care provider. You can do this in the bathroom while a hot shower is running.  Limit your exposure to cool or dry air. Rest  Rest as much as possible.  Sleep with your head raised (elevated).  Make sure to get enough sleep each night. General instructions  Apply a warm, moist washcloth to your face 3-4 times a day or as told by your health care provider. This will help with discomfort.  Wash your hands often with soap and water to reduce your exposure to viruses and other germs. If soap and water are not available, use hand sanitizer.  Do not smoke. Avoid being around people who are smoking (secondhand smoke).  Keep all follow-up visits as told by your health care provider. This is important. Contact a health care provider if:  You have a fever.  Your symptoms get worse.  Your symptoms do not improve within 10 days. Get help right away if:  You have a severe headache.  You have persistent vomiting.  You have pain or swelling around your face or  eyes.  You have vision problems.  You develop confusion.  Your neck is stiff.  You have trouble breathing. This information is not intended to replace advice given to you by your health care provider. Make sure you discuss any questions you have with your health care provider. Document Released: 01/16/2005 Document Revised: 09/12/2015 Document Reviewed: 11/11/2014 Elsevier Interactive Patient Education  2017 [employer]   Bedbugs Introduction Bedbugs are tiny bugs that live in and around beds. They stay hidden during the day, and they come out at night and bite. Bedbugs need blood to live and grow. Where are bedbugs found? Bedbugs can be found anywhere, whether a place is clean or dirty. They are most often found in places where many people come and go, such as hotels, shelters, dorms, and health care settings. It is also common for them to be found in homes where there are many birds or bats nearby. What are bedbug bites like? A bedbug bite leaves a small red bump with a darker red dot in the middle. The bump may appear soon after  a person is bitten or a day or more later. Bedbug bites usually do not hurt, but they may itch. Most people do not need treatment for bedbug bites. The bumps usually go away on their own in a few days. How do I check for bedbugs? Bedbugs are reddish-brown, oval, and flat. They range in size from 1 mm to 7 mm and they cannot fly. Look for bedbugs in these places:  On mattresses, bed frames, headboards, and box springs.  On drapes and curtains in bedrooms.  Under carpeting in bedrooms.  Behind electrical outlets.  Behind any wallpaper that is peeling.  Inside luggage. Also look for black or red spots or stains on or near the bed. Stains can come from bedbugs that have been crushed or from bedbug waste. What should I do if I find bedbugs? When Traveling  If you find bedbugs while traveling, check all of your possessions carefully before you bring  them into your home. Consider throwing away anything that has bedbugs on it. At Home  If you find bedbugs at home, your bedroom may need to be treated by a pest control expert. You may also need to throw away mattresses or luggage. To help keep bedbugs from coming back, consider taking these actions:  Put a plastic cover over your mattress.  Wash your clothes and bedding in water that is hotter than 120F (48.9C) and dry them on a hot setting. Bedbugs are killed by high temperatures.  Vacuum often around the bed and in all of the cracks and crevices where the bugs might hide.  Carefully check all used furniture, bedding, or clothes that you bring into your home.  Eliminate bird nests and bat roosts that are near your home. In Your Bed  If you find bedbugs in your bed, consider wearing pajamas that have long sleeves and pant legs. Bedbugs usually bite areas of the skin that are not covered. This information is not intended to replace advice given to you by your health care provider. Make sure you discuss any questions you have with your health care provider. Document Released: 02/18/2010 Document Revised: 06/24/2015 Document Reviewed: 01/12/2014  2017 Elsevier

## 2016-01-17 NOTE — Progress Notes (Signed)
   Subjective:    Patient ID: Kim Klein, female    DOB: 06/13/1980, 35 y.o.   MRN: [id number]  Headache   Associated symptoms include coughing, ear pain, a sore throat and swollen glands. Pertinent negatives include no sinus pressure.  Sinusitis  This is a new problem. The current episode started 1 to 4 weeks ago. The problem has been gradually worsening since onset. There has been no fever. Her pain is at a severity of 7/10. The pain is moderate. Associated symptoms include congestion, coughing, ear pain, headaches, a hoarse voice, shortness of breath, sneezing, a sore throat and swollen glands. Pertinent negatives include no sinus pressure. Past treatments include oral decongestants and spray decongestants. The treatment provided mild relief.      Review of Systems  HENT: Positive for congestion, ear pain, hoarse voice, sneezing and sore throat. Negative for sinus pressure.   Respiratory: Positive for cough and shortness of breath.   Neurological: Positive for headaches.  All other systems reviewed and are negative.      Objective:   Physical Exam  Constitutional: She is oriented to person, place, and time. She appears well-developed and well-nourished. No distress.  HENT:  Head: Normocephalic and atraumatic.  Right Ear: External ear normal. Tympanic membrane is erythematous.  Left Ear: External ear normal.  Nose: Mucosal edema and rhinorrhea present. Right sinus exhibits maxillary sinus tenderness. Left sinus exhibits maxillary sinus tenderness.  Mouth/Throat: Posterior oropharyngeal erythema present.  Eyes: Pupils are equal, round, and reactive to light.  Neck: Normal range of motion. Neck supple. No thyromegaly present.  Cardiovascular: Normal rate, regular rhythm, normal heart sounds and intact distal pulses.   No murmur heard. Pulmonary/Chest: Effort normal and breath sounds normal. No respiratory distress. She has no wheezes.  Abdominal: Soft. Bowel sounds are normal.  She exhibits no distension. There is no tenderness.  Musculoskeletal: Normal range of motion. She exhibits no edema or tenderness.  Neurological: She is alert and oriented to person, place, and time. She has normal reflexes. No cranial nerve deficit.  Skin: Skin is warm and dry.  Psychiatric: She has a normal mood and affect. Her behavior is normal. Judgment and thought content normal.  Vitals reviewed.    BP 115/82   Pulse 77   Temp 98.6 F (37 C) (Oral)   Ht 5\' 2"  (1.575 m)   Wt 204 lb (92.5 kg)   BMI 37.31 kg/m       Assessment & Plan:  1. Acute maxillary sinusitis, recurrence not specified - Take meds as prescribed - Use a cool mist humidifier  -Use saline nose sprays frequently -Saline irrigations of the nose can be very helpful if done frequently.  * 4X daily for 1 week*  * Use of a nettie pot can be helpful with this. Follow directions with this* -Force fluids -For any cough or congestion  Use plain Mucinex- regular strength or max strength is fine   * Children- consult with Pharmacist for dosing -For fever or aces or pains- take tylenol or ibuprofen appropriate for age and weight.  * for fevers greater than 101 orally you may alternate ibuprofen and tylenol every  3 hours. -Throat lozenges if help - doxycycline (VIBRA-TABS) 100 MG tablet; Take 1 tablet (100 mg total) by mouth 2 (two) times daily.  Dispense: 20 tablet; Refill: 0  Jannifer Rodneyhristy Eliska Hamil, FNP

## 2016-05-10 ENCOUNTER — Ambulatory Visit (INDEPENDENT_AMBULATORY_CARE_PROVIDER_SITE_OTHER): Payer: BC Managed Care – PPO | Admitting: Physician Assistant

## 2016-05-10 ENCOUNTER — Encounter: Payer: Self-pay | Admitting: Physician Assistant

## 2016-05-10 VITALS — BP 110/76 | HR 72 | Temp 97.0°F | Ht 62.0 in | Wt 206.0 lb

## 2016-05-10 DIAGNOSIS — S0086XA Insect bite (nonvenomous) of other part of head, initial encounter: Secondary | ICD-10-CM | POA: Diagnosis not present

## 2016-05-10 DIAGNOSIS — S60562A Insect bite (nonvenomous) of left hand, initial encounter: Secondary | ICD-10-CM

## 2016-05-10 DIAGNOSIS — W57XXXA Bitten or stung by nonvenomous insect and other nonvenomous arthropods, initial encounter: Secondary | ICD-10-CM

## 2016-05-10 MED ORDER — METHYLPREDNISOLONE ACETATE 80 MG/ML IJ SUSP
80.0000 mg | Freq: Once | INTRAMUSCULAR | Status: AC
  Administered 2016-05-10: 80 mg via INTRAMUSCULAR

## 2016-05-10 MED ORDER — CETIRIZINE HCL 10 MG PO TABS: 10.0000 mg | ORAL_TABLET | Freq: Every day | ORAL | 11 refills | Status: DC

## 2016-05-10 NOTE — Progress Notes (Signed)
BP 110/76   Pulse 72   Temp 97 F (36.1 C) (Oral)   Ht  (1.575 m)   Wt 206 lb (93.4 kg)   BMI 37.68 kg/m    Subjective:    Patient ID: Kim Klein, female    DOB: 1980/06/03, 36 y.o.   MRN: [id number]  HPI: Kim Klein is a 36 y.o. female presenting on 05/10/2016 for Insect bites (hands, forehead, chin and shoulders)  Patient felt bites 2 nights ago and swelling has increase in the last 2 days. Hand lesions are red, warm and swollen and tender to the touch. Does have spiders around her house. Few months ago house was inspected for bedbugs and found to be clear.    Relevant past medical, surgical, family and social history reviewed and updated as indicated. Allergies and medications reviewed and updated.  Past Medical History:  Diagnosis Date  . Depression   . PCOS (polycystic ovarian syndrome)     Past Surgical History:  Procedure Laterality Date  . BREAST SURGERY     lump removed    Review of Systems  Constitutional: Negative.   HENT: Negative.   Eyes: Negative.   Respiratory: Negative.   Gastrointestinal: Negative.   Genitourinary: Negative.   Skin: Positive for color change, rash and wound.    Allergies as of 05/10/2016      Reactions   Augmentin [amoxicillin-pot Clavulanate] Other (See Comments)   Stomach pain and rectal bleeding   Cinnamon    Strawberry Extract       Medication List       Accurate as of 05/10/16  9:46 AM. Always use your most recent med list.          ALTAVERA 0.15-30 MG-MCG tablet Generic drug:  levonorgestrel-ethinyl estradiol TAKE ONE TABLET EVERY DAY.   cetirizine 10 MG tablet Commonly known as:  ZYRTEC Take 1 tablet (10 mg total) by mouth daily.   cyclobenzaprine 5 MG tablet Commonly known as:  FLEXERIL Take 1 tablet (5 mg total) by mouth 3 (three) times daily as needed for muscle spasms.   escitalopram 10 MG tablet Commonly known as:  LEXAPRO Take 1 tablet (10 mg total) by mouth daily.   metFORMIN 500  MG 24 hr tablet Commonly known as:  GLUCOPHAGE-XR Take 2 tablets (1,000 mg total) by mouth daily with supper.   valACYclovir 1000 MG tablet Commonly known as:  VALTREX Take 1 tablet (1,000 mg total) by mouth 2 (two) times daily.   Vitamin D (Ergocalciferol) 50000 units Caps capsule Commonly known as:  DRISDOL Take 1 capsule (50,000 Units total) by mouth every 7 (seven) days.          Objective:    BP 110/76   Pulse 72   Temp 97 F (36.1 C) (Oral)   Ht  (1.575 m)   Wt 206 lb (93.4 kg)   BMI 37.68 kg/m   Allergies  Allergen Reactions  . Augmentin [Amoxicillin-Pot Clavulanate] Other (See Comments)    Stomach pain and rectal bleeding  . Cinnamon   . Strawberry Extract     Physical Exam  Constitutional: She is oriented to person, place, and time. She appears well-developed and well-nourished.  HENT:  Head: Normocephalic and atraumatic.  Eyes: Conjunctivae and EOM are normal. Pupils are equal, round, and reactive to light.  Cardiovascular: Normal rate, regular rhythm, normal heart sounds and intact distal pulses.   Pulmonary/Chest: Effort normal and breath sounds normal.  Abdominal: Soft. Bowel sounds  are normal.  Neurological: She is alert and oriented to person, place, and time. She has normal reflexes.  Skin: Skin is warm, dry and intact. Lesion and rash noted. Rash is urticarial. There is erythema.     Psychiatric: She has a normal mood and affect. Her behavior is normal. Judgment and thought content normal.        Assessment & Plan:   1. Bug bite, initial encounter - methylPREDNISolone acetate (DEPO-MEDROL) injection 80 mg; Inject 1 mL (80 mg total) into the muscle once. - cetirizine (ZYRTEC) 10 MG tablet; Take 1 tablet (10 mg total) by mouth daily.  Dispense: 30 tablet; Refill: 11   Continue all other maintenance medications as listed above.  Follow up plan: Return if symptoms worsen or fail to improve.  Educational handout given for bug  bites  Remus Loffler PA-C [facility] [address]  [place], [place] [address] [phone]   05/10/2016, 9:46 AM

## 2016-05-10 NOTE — Patient Instructions (Signed)
Insect Bite, Adult An insect bite can make your skin red, itchy, and swollen. Some insects can spread disease to people with a bite. However, most insect bites do not lead to disease, and most are not serious. Follow these instructions at home: Bite area care   Do not scratch the bite area.  Keep the bite area clean and dry.  Wash the bite area every day with soap and water as told by your doctor.  Check the bite area every day for signs of infection. Check for:  More redness, swelling, or pain.  Fluid or blood.  Warmth.  Pus. Managing pain, itching, and swelling   You may put any of these on the bite area as told by your doctor:  A baking soda paste.  Cortisone cream.  Calamine lotion.  If directed, put ice on the bite area.  Put ice in a plastic bag.  Place a towel between your skin and the bag.  Leave the ice on for 20 minutes, 2-3 times a day. Medicines   Take medicines or put medicines on your skin only as told by your doctor.  If you were prescribed an antibiotic medicine, use it as told by your doctor. Do not stop using the antibiotic even if your condition improves. General instructions   Keep all follow-up visits as told by your doctor. This is important. How is this prevented? To help you have a lower risk of insect bites:  When you are outside, wear clothing that covers your arms and legs.  Use insect repellent. The best insect repellents have:  An active ingredient of DEET, picaridin, oil of lemon eucalyptus (OLE), or IR3535.  Higher amounts of DEET or another active ingredient than other repellents have.  If your home windows do not have screens, think about putting some in. Contact a doctor if:  You have more redness, swelling, or pain in the bite area.  You have fluid, blood, or pus coming from the bite area.  The bite area feels warm.  You have a fever. Get help right away if:  You have joint pain.  You have a rash.  You have  shortness of breath.  You feel more tired or sleepy than you normally do.  You have neck pain.  You have a headache.  You feel weaker than you normally do.  You have chest pain.  You have pain in your belly.  You feel sick to your stomach (nauseous) or you throw up (vomit). Summary  An insect bite can make your skin red, itchy, and swollen.  Do not scratch the bite area, and keep it clean and dry.  Ice can help with pain and itching from the bite. This information is not intended to replace advice given to you by your health care provider. Make sure you discuss any questions you have with your health care provider. Document Released: 01/14/2000 Document Revised: 08/19/2015 Document Reviewed: 06/03/2014 Elsevier Interactive Patient Education  2017 Elsevier Inc.  

## 2016-06-09 ENCOUNTER — Other Ambulatory Visit: Payer: Self-pay | Admitting: Family

## 2016-06-09 DIAGNOSIS — F411 Generalized anxiety disorder: Secondary | ICD-10-CM

## 2016-09-18 ENCOUNTER — Other Ambulatory Visit: Payer: Self-pay | Admitting: Family

## 2016-09-18 DIAGNOSIS — Z3041 Encounter for surveillance of contraceptive pills: Secondary | ICD-10-CM

## 2016-10-30 ENCOUNTER — Ambulatory Visit (INDEPENDENT_AMBULATORY_CARE_PROVIDER_SITE_OTHER): Payer: BC Managed Care – PPO | Admitting: Family Medicine

## 2016-10-30 VITALS — BP 128/83 | HR 83 | Temp 98.2°F | Ht 62.0 in | Wt 214.0 lb

## 2016-10-30 DIAGNOSIS — J019 Acute sinusitis, unspecified: Secondary | ICD-10-CM | POA: Diagnosis not present

## 2016-10-30 MED ORDER — BENZONATATE 200 MG PO CAPS: 200.0000 mg | ORAL_CAPSULE | Freq: Two times a day (BID) | ORAL | 0 refills | Status: DC | PRN

## 2016-10-30 MED ORDER — AZITHROMYCIN 250 MG PO TABS: ORAL_TABLET | ORAL | 0 refills | Status: DC

## 2016-10-30 NOTE — Patient Instructions (Signed)
I have given you a pocket prescription to use if your symptoms persist or worsen. I sent and a cough medication called Tessalon to use twice daily as needed for cough.  It appears that you have an upper respiratory infection (eg cold).  Cold symptoms can last up to 2 weeks.    - Get plenty of rest and drink plenty of fluids. - Try to breathe moist air. Use a humidifier or take a steamy shower. - Consume warm fluids (soup or tea) to provide relief for a stuffy nose and to loosen phlegm. - For nasal stuffiness, try saline nasal spray or a Neti Pot. - For sore throat pain relief: suck on throat lozenges, hard candy or popsicles; gargle with warm salt water (1/4 tsp. salt per 8 oz. of water); and eat soft, bland foods. - Eat a well-balanced diet. If you cannot, ensure you are getting enough nutrients by taking a daily multivitamin. - Avoid dairy products, as they can thicken phlegm. - Avoid alcohol, as it impairs your body's immune system.  CONTACT YOUR DOCTOR IF YOU EXPERIENCE ANY OF THE FOLLOWING: - High fever - Ear pain - Sinus-type headache - Unusually severe cold symptoms - Cough that gets worse while other cold symptoms improve - Flare up of any chronic lung problem, such as asthma - Your symptoms persist longer than 2 weeks

## 2016-10-30 NOTE — Progress Notes (Signed)
Subjective: ZO:XWRUE symptoms PCP: Junie Spencer, FNP [id number] H Pelphrey is a 36 y.o. female presenting to clinic today for:  1. Sinusitis  Patient reports onset of symptoms Wednesday. She notes a low-grade fever, sinus drainage, increased sinus pressure that seems to be now moving into her chest. She also reports associated nausea with no vomiting or diarrhea. She is hydrating well but reports a decreased appetite. She endorses dry cough. No hemoptysis. Endorses headache but no changes in vision, dizziness. She has been using NyQuil and Zantac which have helped some but cause sedation. 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  Allergies  Allergen Reactions  . Augmentin [Amoxicillin-Pot Clavulanate] Other (See Comments)    Stomach pain and rectal bleeding  . Cinnamon   . Strawberry Extract    Past Medical History:  Diagnosis Date  . Depression   . PCOS (polycystic ovarian syndrome)    Family History  Problem Relation Age of Onset  . Cancer Maternal Aunt        BREAST  . Cancer Maternal Grandmother        BREAST  . COPD Maternal Grandmother   . Diabetes Maternal Grandmother    Social Hx: non smoker.Current medications reviewed.   ROS: Per HPI  Objective: Office vital signs reviewed. BP 128/83   Pulse 83   Temp 98.2 F (36.8 C) (Oral)   Ht  (1.575 m)   Wt 214 lb (97.1 kg)   BMI 39.14 kg/m   Physical Examination:  General: Awake, alert, well nourished, nontoxic appearing, No acute distress HEENT: Normal    Neck: No masses palpated. No lymphadenopathy    Ears: Tympanic membranes intact, decreased light reflex bilaterally, no erythema, no bulging    Eyes: PERRLA, extraocular membranes intact, sclera white, no ocular discharge    Nose: nasal turbinates moist, clear nasal discharge    Throat: moist mucus membranes, no erythema, no tonsillar exudate.  Airway is patent Cardio: regular rate and rhythm, S1S2 heard, no murmurs appreciated Pulm: Some  transmitted upper airway sounds. Otherwise clear to auscultation bilaterally, no wheezes, rhonchi or rales; normal work of breathing on room air  Assessment/ Plan: 36 y.o. female   1. Acute sinusitis, recurrence not specified, unspecified location Patient is well-appearing on exam. She is afebrile with normal vital signs. Her exam was significant for decreased light reflexes of the TMs bilaterally and transmitted upper airway sounds. No overt evidence of actual infection on exam. However patient has a history of recurrent sinus infections therefore a pocket prescription for Z-Pak was provided. She has an allergy to Augmentin. She was given a prescription for Tessalon to use twice daily as needed for cough. Should return precautions were reviewed with the patient. She was good understanding. She was given a work note excusing her for the next couple of days. Oral hydration and rest was encouraged. She'll follow up as needed.  No orders of the defined types were placed in this encounter.  Meds ordered this encounter  Medications  . azithromycin (ZITHROMAX Z-PAK) 250 MG tablet    Sig: As directed    Dispense:  6 tablet    Refill:  0  . benzonatate (TESSALON) 200 MG capsule    Sig: Take 1 capsule (200 mg total) by mouth 2 (two) times daily as needed for cough.    Dispense:  20 capsule    Refill:  0     Whitney Hillegass Hulen Skains, DO [facility] [phone]

## 2016-11-06 NOTE — Progress Notes (Deleted)
Subjective: UJ:WJXB HPI: Aurie Harroun Somero is a 36 y.o. female presenting to clinic today for:  1. PCOS ***   Social Hx: *** smoker.  Family History  Problem Relation Age of Onset  . Cancer Maternal Aunt        BREAST  . Cancer Maternal Grandmother        BREAST  . COPD Maternal Grandmother   . Diabetes Maternal Grandmother    Past Medical History:  Diagnosis Date  . Depression   . PCOS (polycystic ovarian syndrome)    Allergies  Allergen Reactions  . Augmentin [Amoxicillin-Pot Clavulanate] Other (See Comments)    Stomach pain and rectal bleeding  . Cinnamon   . Strawberry Extract     Current Outpatient Prescriptions:  .  azithromycin (ZITHROMAX Z-PAK) 250 MG tablet, As directed, Disp: 6 tablet, Rfl: 0 .  benzonatate (TESSALON) 200 MG capsule, Take 1 capsule (200 mg total) by mouth 2 (two) times daily as needed for cough., Disp: 20 capsule, Rfl: 0 .  cetirizine (ZYRTEC) 10 MG tablet, Take 1 tablet (10 mg total) by mouth daily., Disp: 30 tablet, Rfl: 11 .  cyclobenzaprine (FLEXERIL) 5 MG tablet, Take 1 tablet (5 mg total) by mouth 3 (three) times daily as needed for muscle spasms., Disp: 30 tablet, Rfl: 1 .  escitalopram (LEXAPRO) 10 MG tablet, TAKE ONE TABLET BY MOUTH ONCE DAILY, Disp: 90 tablet, Rfl: 0 .  LILLOW 0.15-30 MG-MCG tablet, TAKE ONE TABLET EVERY DAY., Disp: 84 tablet, Rfl: 1 .  metFORMIN (GLUCOPHAGE-XR) 500 MG 24 hr tablet, Take 2 tablets (1,000 mg total) by mouth daily with supper., Disp: 60 tablet, Rfl: 6 .  valACYclovir (VALTREX) 1000 MG tablet, Take 1 tablet (1,000 mg total) by mouth 2 (two) times daily., Disp: 20 tablet, Rfl: 0 .  Vitamin D, Ergocalciferol, (DRISDOL) 50000 units CAPS capsule, Take 1 capsule (50,000 Units total) by mouth every 7 (seven) days., Disp: 12 capsule, Rfl: 3  Health Maintenance: ***  Flu Vaccine: {YES/NO/WILD CARDS:18581}  Tdap Vaccine: {YES/NO/WILD JYNWG:95621}  - every 76yrs - (<3 lifetime doses or unknown): all wounds --  look up need for Tetanus IG - (>=3 lifetime doses): clean/minor wound if >29yrs from previous; all other wounds if >4yrs from previous Zoster Vaccine: {YES/NO/WILD CARDS:18581} (those >50yo, once) Pneumonia Vaccine: {YES/NO/WILD HYQMV:78469} (those w/ risk factors) - (<70yr) Both: Immunocompromised, cochlear implant, CSF leak, asplenic, sickle cell, Chronic Renal Failure - (<54yr) PPSV-23 only: Heart dz, lung disease, DM, tobacco abuse, alcoholism, cirrhosis/liver disease. - (>30yr): PPSV13 then PPSV23 in 6-12mths;  - (>55yr): repeat PPSV23 once if pt received prior to 35yo and 19yrs have passed  ROS: Per HPI  Objective: Office vital signs reviewed. There were no vitals taken for this visit.  Physical Examination:  General: Awake, alert, *** nourished, No acute distress HEENT: Normal    Neck: No masses palpated. No lymphadenopathy    Ears: Tympanic membranes intact, normal light reflex, no erythema, no bulging    Eyes: PERRLA, extraocular movement in tact, sclera ***    Nose: nasal turbinates moist, *** nasal discharge    Throat: moist mucus membranes, no erythema, *** tonsillar exudate.  Airway is patent Cardio: regular rate and rhythm, S1S2 heard, no murmurs appreciated Pulm: clear to auscultation bilaterally, no wheezes, rhonchi or rales; normal work of breathing on room air GI: soft, non-tender, non-distended, bowel sounds present x4, no hepatomegaly, no splenomegaly, no masses GU: external vaginal tissue ***, cervix ***, *** punctate lesions on cervix appreciated, *** discharge  from cervical os, *** bleeding, *** cervical motion tenderness, *** abdominal/ adnexal masses Extremities: warm, well perfused, No edema, cyanosis or clubbing; +*** pulses bilaterally MSK: *** gait and *** station Skin: dry; intact; no rashes or lesions Neuro: *** Strength and light touch sensation grossly intact, *** DTRs ***/4  Assessment/ Plan: 36 y.o. female   No problem-specific Assessment & Plan  notes found for this encounter.   Raliegh Ip, DO [facility] [phone]

## 2016-11-17 ENCOUNTER — Ambulatory Visit: Payer: BC Managed Care – PPO | Admitting: Family Medicine

## 2016-11-20 ENCOUNTER — Ambulatory Visit (INDEPENDENT_AMBULATORY_CARE_PROVIDER_SITE_OTHER): Payer: BC Managed Care – PPO | Admitting: Family Medicine

## 2016-11-20 VITALS — BP 108/82 | HR 81 | Temp 98.5°F | Ht 62.0 in | Wt 217.0 lb

## 2016-11-20 DIAGNOSIS — E282 Polycystic ovarian syndrome: Secondary | ICD-10-CM | POA: Diagnosis not present

## 2016-11-20 DIAGNOSIS — E669 Obesity, unspecified: Secondary | ICD-10-CM | POA: Diagnosis not present

## 2016-11-20 DIAGNOSIS — Z23 Encounter for immunization: Secondary | ICD-10-CM

## 2016-11-20 DIAGNOSIS — F411 Generalized anxiety disorder: Secondary | ICD-10-CM

## 2016-11-20 DIAGNOSIS — E559 Vitamin D deficiency, unspecified: Secondary | ICD-10-CM

## 2016-11-20 LAB — BAYER DCA HB A1C WAIVED: HB A1C (BAYER DCA - WAIVED): 4.7 % (ref <7.0)

## 2016-11-20 MED ORDER — PHENTERMINE HCL 37.5 MG PO TABS: 18.2500 mg | ORAL_TABLET | Freq: Every day | ORAL | 0 refills | Status: DC

## 2016-11-20 MED ORDER — ESCITALOPRAM OXALATE 10 MG PO TABS: 20.0000 mg | ORAL_TABLET | Freq: Every day | ORAL | 0 refills | Status: DC

## 2016-11-20 NOTE — Assessment & Plan Note (Signed)
We did discuss consideration for spironolactone to help with hirsutism.  However, patient does have borderline low systolic blood pressure and I am reluctant to start an antihypertensive at this time.  For now, I think it is pertinent to allow the metformin time to work.  Continue oral contraceptive pill as prescribed.  Will work on weight loss as above.  Follow-up in 1 month.

## 2016-11-20 NOTE — Patient Instructions (Addendum)
You have been prescribed phentermine.  You may take 1/2 tablet to 1 tablet daily for appetite suppression/weight loss.  As we discussed the most common side effect is dry mouth, increased blood pressure, and sometimes constipation.  Insomnia may also be a side effect and therefore he should take this each morning. monitor your blood pressure at least once weekly and record this.  Monitor your weight at least weekly and keep a log of this.  Send me these results via my chart and plan to see me in 1 month or sooner if needed.  If you develop headache, chest pain, shortness of breath, blurred vision, please discontinue medication and seek immediate medical attention.  Continue taking your metformin daily as directed.  A referral has been placed to dietitian.  I have increased her Lexapro to 20 mg daily.  You may take 2 of your 10 mg tablets until you finish your old prescription then plan to get the 20 mg dose refilled.  Taking the medicine as directed and not missing any doses is one of the best things you can do to treat your depression.  Here are some things to keep in mind:  1) Side effects (stomach upset, some increased anxiety) may happen before you notice a benefit.  These side effects typically go away over time. 2) Changes to your dose of medicine or a change in medication all together is sometimes necessary 3) Most people need to be on medication at least 12 months 4) Many people will notice an improvement within two weeks but the full effect of the medication can take up to 4-6 weeks 5) Stopping the medication when you start feeling better often results in a return of symptoms 6) Never discontinue your medication without contacting a health care professional first.  Some medications require gradual discontinuation/ taper and can make you sick if you stop them abruptly.  If your symptoms worsen or you have thoughts of suicide/homicide, PLEASE SEEK IMMEDIATE MEDICAL ATTENTION.  You may always  call the National Suicide Hotline.  This is available 24 hours a day, 7 days a week.  Their phone number is: [phone]  I have provided you information below of the injectable that we discussed.  Liraglutide injection (Weight Management) What is this medicine? LIRAGLUTIDE (LIR a GLOO tide) is used with a reduced calorie diet and exercise to help you lose weight. This medicine may be used for other purposes; ask your health care provider or pharmacist if you have questions. COMMON BRAND NAME(S): Saxenda What should I tell my health care provider before I take this medicine? They need to know if you have any of these conditions: -endocrine tumors (MEN 2) or if someone in your family had these tumors -gallbladder disease -high cholesterol -history of alcohol abuse problem -history of pancreatitis -kidney disease or if you are on dialysis -liver disease -previous swelling of the tongue, face, or lips with difficulty breathing, difficulty swallowing, hoarseness, or tightening of the throat -stomach problems -suicidal thoughts, plans, or attempt; a previous suicide attempt by you or a family member -thyroid cancer or if someone in your family had thyroid cancer -an unusual or allergic reaction to liraglutide, other medicines, foods, dyes, or preservatives -pregnant or trying to get pregnant -breast-feeding How should I use this medicine? This medicine is for injection under the skin of your upper leg, stomach area, or upper arm. You will be taught how to prepare and give this medicine. Use exactly as directed. Take your medicine at regular  intervals. Do not take it more often than directed. It is important that you put your used needles and syringes in a special sharps container. Do not put them in a trash can. If you do not have a sharps container, call your pharmacist or healthcare provider to get one. A special MedGuide will be given to you by the pharmacist with each prescription and  refill. Be sure to read this information carefully each time. Talk to your pediatrician regarding the use of this medicine in children. Special care may be needed. Overdosage: If you think you have taken too much of this medicine contact a poison control center or emergency room at once. NOTE: This medicine is only for you. Do not share this medicine with others. What if I miss a dose? If you miss a dose, take it as soon as you can. If it is almost time for your next dose, take only that dose. Do not take double or extra doses. If you miss your dose for 3 days or more, call your doctor or health care professional to talk about how to restart this medicine. What may interact with this medicine? -insulin and other medicines for diabetes This list may not describe all possible interactions. Give your health care provider a list of all the medicines, herbs, non-prescription drugs, or dietary supplements you use. Also tell them if you smoke, drink alcohol, or use illegal drugs. Some items may interact with your medicine. What should I watch for while using this medicine? Visit your doctor or health care professional for regular checks on your progress. This medicine is intended to be used in addition to a healthy diet and appropriate exercise. The best results are achieved this way. Do not increase or in any way change your dose without consulting your doctor or health care professional. Drink plenty of fluids while taking this medicine. Check with your doctor or health care professional if you get an attack of severe diarrhea, nausea, and vomiting. The loss of too much body fluid can make it dangerous for you to take this medicine. This medicine may affect blood sugar levels. If you have diabetes, check with your doctor or health care professional before you change your diet or the dose of your diabetic medicine. Patients and their families should watch out for worsening depression or thoughts of suicide.  Also watch out for sudden changes in feelings such as feeling anxious, agitated, panicky, irritable, hostile, aggressive, impulsive, severely restless, overly excited and hyperactive, or not being able to sleep. If this happens, especially at the beginning of treatment or after a change in dose, call your health care professional. What side effects may I notice from receiving this medicine? Side effects that you should report to your doctor or health care professional as soon as possible: -allergic reactions like skin rash, itching or hives, swelling of the face, lips, or tongue -breathing problems -diarrhea that continues or is severe -lump or swelling on the neck -severe nausea -signs and symptoms of infection like fever or chills; cough; sore throat; pain or trouble passing urine -signs and symptoms of low blood sugar such as feeling anxious, confusion, dizziness, increased hunger, unusually weak or tired, sweating, shakiness, cold, irritable, headache, blurred vision, fast heartbeat, loss of consciousness -signs and symptoms of kidney injury like trouble passing urine or change in the amount of urine -trouble swallowing -unusual stomach upset or pain -vomiting Side effects that usually do not require medical attention (report to your doctor or health care  professional if they continue or are bothersome): -constipation -decreased appetite -diarrhea -fatigue -headache -nausea -pain, redness, or irritation at site where injected -stomach upset -stuffy or runny nose This list may not describe all possible side effects. Call your doctor for medical advice about side effects. You may report side effects to FDA at 1-800-FDA-1088. Where should I keep my medicine? Keep out of the reach of children. Store unopened pen in a refrigerator between 2 and 8 degrees C (36 and 46 degrees F). Do not freeze or use if the medicine has been frozen. Protect from light and excessive heat. After you first use  the pen, it can be stored at room temperature between 15 and 30 degrees C (59 and 86 degrees F) or in a refrigerator. Throw away your used pen after 30 days or after the expiration date, whichever comes first. Do not store your pen with the needle attached. If the needle is left on, medicine may leak from the pen. NOTE: This sheet is a summary. It may not cover all possible information. If you have questions about this medicine, talk to your doctor, pharmacist, or health care provider.  2018 Elsevier/Gold Standard (2016-02-03 14:41:37)

## 2016-11-20 NOTE — Assessment & Plan Note (Signed)
Reviewed several options today for weight loss, including: Qsymia, Saxenda, phentermine, Wellbutrin.  I did recommend that she consider seeing a dietitian for assistance with food choices.  She is amenable possible cardiovascular side effects and most common side effects to this and a referral has been placed.  Patient has elected to start on phentermine.  We did discuss the risks and benefits, including.  She was good understanding and elected to proceed.  She may take half to 1 tablet by mouth each morning.  She will record her blood pressure at least weekly.  She will also record her weight at least weekly.  I did recommend hydration and caution insomnia.  No known cardiovascular disease, including hypertension.  CMP, lipid panel, A1c, vitamin D level and TSH were ordered today.

## 2016-11-20 NOTE — Assessment & Plan Note (Signed)
Persistent anxiety and depression symptoms despite use of Lexapro 10 mg.  Will increase to 20 mg daily.  Possible side effects reviewed with patient.  She will follow-up in 1 month.

## 2016-11-20 NOTE — Progress Notes (Signed)
Subjective: CC: PCOS HPI: Kim Klein is a 36 y.o. female presenting to clinic today for:  1. Weight management Patient is very interested in weight management.  She has a history of PCOS and is currently treated with an oral contraceptive pill as well as metformin.  She had discontinued metformin Exar 1000 mg secondary to GI side effects in the past.  She restarted this medication about a week and half ago and notes that she is tolerating the medication well.  Denies diarrhea, nausea, vomiting or abdominal pain.  She notes that she has struggled with her weight since about 36 years old.  She weighed between 130 and 140 pounds when she graduated high school and gained "a lot of weight" the first year in college.  Her maximum weight is 217 pounds.  She notes that she went through a divorce about 3 years ago, during which time she gained a substantial amount of weight.  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  She notes stress and depressive symptoms related to this.  She does often eat fast foods and foods on the go.  She reports that most recently she started a fast with her church and has only been eating vegetables and fruits and water.  She often does not eat breakfast each morning.  She does not have regular scheduled exercise but is working towards lifestyle changes.  She does have a family history of diabetes.  No none thyroid disorder.  No known thyroid cancer or endocrine cancers.  She is a non-smoker.  She has never seen a dietitian or use any medications or weight loss in the past.  She is interested in seeing both a dietitian and starting a medication.  2. GAD/ Depression Patient  reports increased anxiety and depression symptoms.  She is compliant with Lexapro 10 mg.  She notes that the symptoms often occur about 2 weeks prior to the onset of her menstrual cycle.  She does report that at least one time in the last month she has had to go home secondary to moodiness.  She  would like to discuss increasing her Lexapro to 20 mg.  No SI, HI or hallucinations.   Social Hx: never smoker.  Family History  Problem Relation Age of Onset  . Cancer Maternal Aunt        BREAST  . Cancer Maternal Grandmother        BREAST  . COPD Maternal Grandmother   . Diabetes Maternal Grandmother    Past Medical History:  Diagnosis Date  . Depression   . PCOS (polycystic ovarian syndrome)    Allergies  Allergen Reactions  . Augmentin [Amoxicillin-Pot Clavulanate] Other (See Comments)    Stomach pain and rectal bleeding  . Cinnamon   . Strawberry Extract     Current Outpatient Prescriptions:  .  cetirizine (ZYRTEC) 10 MG tablet, Take 1 tablet (10 mg total) by mouth daily., Disp: 30 tablet, Rfl: 11 .  cyclobenzaprine (FLEXERIL) 5 MG tablet, Take 1 tablet (5 mg total) by mouth 3 (three) times daily as needed for muscle spasms., Disp: 30 tablet, Rfl: 1 .  escitalopram (LEXAPRO) 10 MG tablet, Take 2 tablets (20 mg total) by mouth daily., Disp: 90 tablet, Rfl: 0 .  LILLOW 0.15-30 MG-MCG tablet, TAKE ONE TABLET EVERY DAY., Disp: 84 tablet, Rfl: 1 .  metFORMIN (GLUCOPHAGE-XR) 500 MG 24 hr tablet, Take 2 tablets (1,000 mg total) by mouth daily with supper., Disp: 60 tablet, Rfl:  6 .  Vitamin D, Ergocalciferol, (DRISDOL) 50000 units CAPS capsule, Take 1 capsule (50,000 Units total) by mouth every 7 (seven) days., Disp: 12 capsule, Rfl: 3 .  phentermine (ADIPEX-P) 37.5 MG tablet, Take 0.5-1 tablets (18.75-37.5 mg total) by mouth daily before breakfast., Disp: 30 tablet, Rfl: 0  Health Maintenance: Flu shot  ROS: Per HPI  Objective: Office vital signs reviewed. BP 108/82   Pulse 81   Temp 98.5 F (36.9 C) (Oral)   Ht 5\' 2"  (1.575 m)   Wt 217 lb (98.4 kg)   BMI 39.69 kg/m   Physical Examination:  General: Awake, alert, well nourished, obese, No acute distress HEENT: Normal    Neck: No masses palpated. No lymphadenopathy, no palpable masses    Throat: moist mucus  membranes Cardio: regular rate, no murmurs appreciated Pulm: no wheezes, normal work of breathing on room air Psych: mood stable, speech normal, good eye contact, thought process appropriate.  [facility] 11/20/2016 10/30/2016 05/10/2016  Decreased Interest 2 0 1  Down, Depressed, Hopeless 2 0 0  PHQ - 2 Score 4 0 1  Altered sleeping 1 - -  Tired, decreased energy 2 - -  Change in appetite 1 - -  Feeling bad or failure about yourself  2 - -  Trouble concentrating 1 - -  Moving slowly or fidgety/restless 0 - -  PHQ-9 Score 11 - -     Assessment/ Plan: 36 y.o. female   Obesity (BMI 30-39.9) Reviewed several options today for weight loss, including: Qsymia, Saxenda, phentermine, Wellbutrin.  I did recommend that she consider seeing a dietitian for assistance with food choices.  She is amenable possible cardiovascular side effects and most common side effects to this and a referral has been placed.  Patient has elected to start on phentermine.  We did discuss the risks and benefits, including.  She was good understanding and elected to proceed.  She may take half to 1 tablet by mouth each morning.  She will record her blood pressure at least weekly.  She will also record her weight at least weekly.  I did recommend hydration and caution insomnia.  No known cardiovascular disease, including hypertension.  CMP, lipid panel, A1c, vitamin D level and TSH were ordered today.  PCOS (polycystic ovarian syndrome) We did discuss consideration for spironolactone to help with hirsutism.  However, patient does have borderline low systolic blood pressure and I am reluctant to start an antihypertensive at this time.  For now, I think it is pertinent to allow the metformin time to work.  Continue oral contraceptive pill as prescribed.  Will work on weight loss as above.  Follow-up in 1 month.  Vitamin D deficiency Has not been checked in greater than 1 year.  GAD (generalized anxiety  disorder) Persistent anxiety and depression symptoms despite use of Lexapro 10 mg.  Will increase to 20 mg daily.  Possible side effects reviewed with patient.  She will follow-up in 1 month.   Total time spent with patient 38 minutes.  Greater than 50% of encounter spent in coordination of care/counseling.   Raliegh IpAshly M Gottschalk, DO [facility] [phone]

## 2016-11-20 NOTE — Assessment & Plan Note (Signed)
Has not been checked in greater than 1 year.

## 2016-11-21 DIAGNOSIS — Z23 Encounter for immunization: Secondary | ICD-10-CM | POA: Diagnosis not present

## 2016-11-21 LAB — CMP14+EGFR
A/G RATIO: 1.5 (ref 1.2–2.2)
ALK PHOS: 48 IU/L (ref 39–117)
ALT: 12 IU/L (ref 0–32)
AST: 15 IU/L (ref 0–40)
Albumin: 4.1 g/dL (ref 3.5–5.5)
BUN / CREAT RATIO: 10 (ref 9–23)
BUN: 9 mg/dL (ref 6–20)
CHLORIDE: 104 mmol/L (ref 96–106)
CO2: 22 mmol/L (ref 20–29)
Calcium: 8.9 mg/dL (ref 8.7–10.2)
Creatinine, Ser: 0.94 mg/dL (ref 0.57–1.00)
GFR calc Af Amer: 92 mL/min/{1.73_m2} (ref >59)
GFR calc non Af Amer: 79 mL/min/{1.73_m2} (ref >59)
GLUCOSE: 55 mg/dL — AB (ref 65–99)
Globulin, Total: 2.7 g/dL (ref 1.5–4.5)
POTASSIUM: 4.9 mmol/L (ref 3.5–5.2)
Sodium: 140 mmol/L (ref 134–144)
Total Protein: 6.8 g/dL (ref 6.0–8.5)

## 2016-11-21 LAB — VITAMIN D 25 HYDROXY (VIT D DEFICIENCY, FRACTURES): VIT D 25 HYDROXY: 17.1 ng/mL — AB (ref 30.0–100.0)

## 2016-11-21 LAB — LIPID PANEL
CHOLESTEROL TOTAL: 214 mg/dL — AB (ref 100–199)
Chol/HDL Ratio: 5.5 ratio — ABNORMAL HIGH (ref 0.0–4.4)
HDL: 39 mg/dL — ABNORMAL LOW (ref >39)
LDL Calculated: 159 mg/dL — ABNORMAL HIGH (ref 0–99)
Triglycerides: 79 mg/dL (ref 0–149)
VLDL CHOLESTEROL CAL: 16 mg/dL (ref 5–40)

## 2016-11-21 LAB — TSH: TSH: 2.06 u[IU]/mL (ref 0.450–4.500)

## 2016-12-01 ENCOUNTER — Other Ambulatory Visit: Payer: Self-pay | Admitting: Family

## 2016-12-01 DIAGNOSIS — F411 Generalized anxiety disorder: Secondary | ICD-10-CM

## 2016-12-05 ENCOUNTER — Other Ambulatory Visit: Payer: Self-pay | Admitting: Family

## 2016-12-05 DIAGNOSIS — F411 Generalized anxiety disorder: Secondary | ICD-10-CM

## 2016-12-05 MED ORDER — ESCITALOPRAM OXALATE 10 MG PO TABS: 10.0000 mg | ORAL_TABLET | Freq: Every day | ORAL | 0 refills | Status: DC

## 2016-12-05 NOTE — Telephone Encounter (Signed)
Lexapro was set on "no print". Re sent to the correct pharmacy

## 2016-12-12 NOTE — Progress Notes (Signed)
Subjective: CC: weight management follow up/ anxiety follow up HPI: Kim Klein is a 36 y.o. female presenting to clinic today for:  1. Weight management Patient was started on phentermine 37.5 mg, 1/2-1 tablet daily, 1 month ago.  She reports good tolerance of medication.  Side effects include increased energy.  She reports the first day she did have insomnia secondary to medication.  However she reports she has been getting up at 5:00 in the morning to take the medication and she is not having difficulty with this anymore.  Denies constipation, abdominal pain, insomnia, increased anxiety, dry mouth.  No chest pain, shortness of breath, heart palpitations, dizziness.  She is also on Metformin 1000 mg and OCPs for PCOS.  She reports that phentermine actually has now offset the diarrhea she was getting from metformin and she is now having normal bowel movements.  Labs obtained at that visit were WNL except for elevated lipid panel.  She has not yet met with the dietitian.  She would like to see them but did not realize that she had been called for an appointment yet.  She is not exercising yet.  She is planning on joining a gym is actually going to look into her insurance to see if they will subsidize this.  2. Anxiety Patient was seen 1 month ago.  She was previously on Lexapro 10 mg.  This was increased to 20 mg daily for persistent anxiety symptoms.  She notes tolerance of this medication.  Denies diarrhea, nausea, vomiting, abdominal pain, increased anxiety or depression.   Social Hx: never smoker.  Family History  Problem Relation Age of Onset  . Cancer Maternal Aunt        BREAST  . Cancer Maternal Grandmother        BREAST  . COPD Maternal Grandmother   . Diabetes Maternal Grandmother    Past Medical History:  Diagnosis Date  . Depression   . PCOS (polycystic ovarian syndrome)    Allergies  Allergen Reactions  . Augmentin [Amoxicillin-Pot Clavulanate] Other (See Comments)      Stomach pain and rectal bleeding  . Cinnamon   . Strawberry Extract     Current Outpatient Medications:  .  cetirizine (ZYRTEC) 10 MG tablet, Take 1 tablet (10 mg total) by mouth daily., Disp: 30 tablet, Rfl: 11 .  cyclobenzaprine (FLEXERIL) 5 MG tablet, Take 1 tablet (5 mg total) by mouth 3 (three) times daily as needed for muscle spasms., Disp: 30 tablet, Rfl: 1 .  escitalopram (LEXAPRO) 10 MG tablet, Take 1 tablet (10 mg total) daily by mouth., Disp: 90 tablet, Rfl: 0 .  LILLOW 0.15-30 MG-MCG tablet, TAKE ONE TABLET EVERY DAY., Disp: 84 tablet, Rfl: 1 .  metFORMIN (GLUCOPHAGE-XR) 500 MG 24 hr tablet, Take 2 tablets (1,000 mg total) by mouth daily with supper., Disp: 60 tablet, Rfl: 6 .  phentermine (ADIPEX-P) 37.5 MG tablet, Take 0.5-1 tablets (18.75-37.5 mg total) by mouth daily before breakfast., Disp: 30 tablet, Rfl: 0 .  Vitamin D, Ergocalciferol, (DRISDOL) 50000 units CAPS capsule, Take 1 capsule (50,000 Units total) by mouth every 7 (seven) days., Disp: 12 capsule, Rfl: 3  ROS: Per HPI  Objective: Office vital signs reviewed. BP 127/86   Pulse 83   Temp 97.8 F (36.6 C) (Oral)   Ht _0  (1.575 m)   Wt 213 lb (96.6 kg)   BMI 38.96 kg/m   Physical Examination:  General: Awake, alert, overweight, well nourished, No acute distress HEENT:  Normal, MMM Cardio: regular rate and rhythm, S1S2 heard, no murmurs appreciated Pulm: clear to auscultation bilaterally, no wheezes, rhonchi or rales; normal work of breathing on room air Ext: Warm, well-perfused, no edema. Psych: Mood stable, speech normal, affect appropriate, pleasant   Depression screen [facility] 2/9 12/20/2016 11/20/2016 10/30/2016  Decreased Interest 1 2 0  Down, Depressed, Hopeless 1 2 0  PHQ - 2 Score 2 4 0  Altered sleeping 1 1 -  Tired, decreased energy 1 2 -  Change in appetite 2 1 -  Feeling bad or failure about yourself  0 2 -  Trouble concentrating 0 1 -  Moving slowly or fidgety/restless - 0 -  Suicidal  thoughts 0 - -  PHQ-9 Score 6 11 -   GAD 7 : Generalized Anxiety Score 12/20/2016 06/29/2015  Nervous, Anxious, on Edge 1 2  Control/stop worrying 2 3  Worry too much - different things 2 3  Trouble relaxing 1 3  Restless 1 0  Easily annoyed or irritable 2 2  Afraid - awful might happen 0 3  Total GAD 7 Score 9 16  Anxiety Difficulty - Very difficult    Assessment/ Plan: 36 y.o. female   Obesity (BMI 30-39.9) Patient tolerating medication well.  She was given 2 additional months of phentermine.  She is using the medication appropriately.  No red flags.  No adverse side effects.  She is interested in pursuing Saxenda at the completion of the 3 months of phentermine.  She was given more information about this medication and she was also provided information on Qsymia.  We will attempt to get her set up with dietitian.  She will follow-up in 2 months for weight management.  Hyperlipemia Lifestyle changes were discussed and recommended.  She has a referral in place to dietitian who she will schedule with soon.  She is in the process of getting a gym membership.  GAD (generalized anxiety disorder) Well-controlled.  She will contact me directly when she is ready for the Lexapro 20 mg tablet Rx.  In the interim, she is doubling up on the Lexapro 10 mg tablets.  Gad 7 score of 9.  PHQ 9 score of 6.   Janora Norlander, DO [place] [phone]

## 2016-12-20 ENCOUNTER — Ambulatory Visit: Payer: BC Managed Care – PPO | Admitting: Family Medicine

## 2016-12-20 ENCOUNTER — Encounter: Payer: Self-pay | Admitting: Family Medicine

## 2016-12-20 VITALS — BP 127/86 | HR 83 | Temp 97.8°F | Ht 62.0 in | Wt 213.0 lb

## 2016-12-20 DIAGNOSIS — F411 Generalized anxiety disorder: Secondary | ICD-10-CM

## 2016-12-20 DIAGNOSIS — E782 Mixed hyperlipidemia: Secondary | ICD-10-CM | POA: Diagnosis not present

## 2016-12-20 DIAGNOSIS — E669 Obesity, unspecified: Secondary | ICD-10-CM

## 2016-12-20 MED ORDER — PHENTERMINE HCL 37.5 MG PO TABS: 18.2500 mg | ORAL_TABLET | Freq: Every day | ORAL | 0 refills | Status: DC

## 2016-12-20 MED ORDER — ESCITALOPRAM OXALATE 10 MG PO TABS: 20.0000 mg | ORAL_TABLET | Freq: Every day | ORAL | 0 refills | Status: DC

## 2016-12-20 NOTE — Assessment & Plan Note (Addendum)
Well-controlled.  She will contact me directly when she is ready for the Lexapro 20 mg tablet Rx.  In the interim, she is doubling up on the Lexapro 10 mg tablets.  Gad 7 score of 9.  PHQ 9 score of 6.

## 2016-12-20 NOTE — Assessment & Plan Note (Signed)
Lifestyle changes were discussed and recommended.  She has a referral in place to dietitian who she will schedule with soon.  She is in the process of getting a gym membership.

## 2016-12-20 NOTE — Assessment & Plan Note (Signed)
Patient tolerating medication well.  She was given 2 additional months of phentermine.  She is using the medication appropriately.  No red flags.  No adverse side effects.  She is interested in pursuing Saxenda at the completion of the 3 months of phentermine.  She was given more information about this medication and she was also provided information on Qsymia.  We will attempt to get her set up with dietitian.  She will follow-up in 2 months for weight management.

## 2016-12-20 NOTE — Patient Instructions (Addendum)
Follow-up with dietitian as recommended.  See back in 2 months for continued weight management.  Check in with your insurance with regards to coverage for Saxenda.  If this does end up requiring a preauthorization, call me and I will put that in the first of the year.  Liraglutide injection (Weight Management) What is this medicine? LIRAGLUTIDE (LIR a GLOO tide) is used with a reduced calorie diet and exercise to help you lose weight. This medicine may be used for other purposes; ask your health care provider or pharmacist if you have questions. COMMON BRAND NAME(S): Saxenda What should I tell my health care provider before I take this medicine? They need to know if you have any of these conditions: -endocrine tumors (MEN 2) or if someone in your family had these tumors -gallbladder disease -high cholesterol -history of alcohol abuse problem -history of pancreatitis -kidney disease or if you are on dialysis -liver disease -previous swelling of the tongue, face, or lips with difficulty breathing, difficulty swallowing, hoarseness, or tightening of the throat -stomach problems -suicidal thoughts, plans, or attempt; a previous suicide attempt by you or a family member -thyroid cancer or if someone in your family had thyroid cancer -an unusual or allergic reaction to liraglutide, other medicines, foods, dyes, or preservatives -pregnant or trying to get pregnant -breast-feeding How should I use this medicine? This medicine is for injection under the skin of your upper leg, stomach area, or upper arm. You will be taught how to prepare and give this medicine. Use exactly as directed. Take your medicine at regular intervals. Do not take it more often than directed. It is important that you put your used needles and syringes in a special sharps container. Do not put them in a trash can. If you do not have a sharps container, call your pharmacist or healthcare provider to get one. A special MedGuide  will be given to you by the pharmacist with each prescription and refill. Be sure to read this information carefully each time. Talk to your pediatrician regarding the use of this medicine in children. Special care may be needed. Overdosage: If you think you have taken too much of this medicine contact a poison control center or emergency room at once. NOTE: This medicine is only for you. Do not share this medicine with others. What if I miss a dose? If you miss a dose, take it as soon as you can. If it is almost time for your next dose, take only that dose. Do not take double or extra doses. If you miss your dose for 3 days or more, call your doctor or health care professional to talk about how to restart this medicine. What may interact with this medicine? -insulin and other medicines for diabetes This list may not describe all possible interactions. Give your health care provider a list of all the medicines, herbs, non-prescription drugs, or dietary supplements you use. Also tell them if you smoke, drink alcohol, or use illegal drugs. Some items may interact with your medicine. What should I watch for while using this medicine? Visit your doctor or health care professional for regular checks on your progress. This medicine is intended to be used in addition to a healthy diet and appropriate exercise. The best results are achieved this way. Do not increase or in any way change your dose without consulting your doctor or health care professional. Drink plenty of fluids while taking this medicine. Check with your doctor or health care professional if you  get an attack of severe diarrhea, nausea, and vomiting. The loss of too much body fluid can make it dangerous for you to take this medicine. This medicine may affect blood sugar levels. If you have diabetes, check with your doctor or health care professional before you change your diet or the dose of your diabetic medicine. Patients and their families  should watch out for worsening depression or thoughts of suicide. Also watch out for sudden changes in feelings such as feeling anxious, agitated, panicky, irritable, hostile, aggressive, impulsive, severely restless, overly excited and hyperactive, or not being able to sleep. If this happens, especially at the beginning of treatment or after a change in dose, call your health care professional. What side effects may I notice from receiving this medicine? Side effects that you should report to your doctor or health care professional as soon as possible: -allergic reactions like skin rash, itching or hives, swelling of the face, lips, or tongue -breathing problems -diarrhea that continues or is severe -lump or swelling on the neck -severe nausea -signs and symptoms of infection like fever or chills; cough; sore throat; pain or trouble passing urine -signs and symptoms of low blood sugar such as feeling anxious, confusion, dizziness, increased hunger, unusually weak or tired, sweating, shakiness, cold, irritable, headache, blurred vision, fast heartbeat, loss of consciousness -signs and symptoms of kidney injury like trouble passing urine or change in the amount of urine -trouble swallowing -unusual stomach upset or pain -vomiting Side effects that usually do not require medical attention (report to your doctor or health care professional if they continue or are bothersome): -constipation -decreased appetite -diarrhea -fatigue -headache -nausea -pain, redness, or irritation at site where injected -stomach upset -stuffy or runny nose This list may not describe all possible side effects. Call your doctor for medical advice about side effects. You may report side effects to FDA at 1-800-FDA-1088. Where should I keep my medicine? Keep out of the reach of children. Store unopened pen in a refrigerator between 2 and 8 degrees C (36 and 46 degrees F). Do not freeze or use if the medicine has been  frozen. Protect from light and excessive heat. After you first use the pen, it can be stored at room temperature between 15 and 30 degrees C (59 and 86 degrees F) or in a refrigerator. Throw away your used pen after 30 days or after the expiration date, whichever comes first. Do not store your pen with the needle attached. If the needle is left on, medicine may leak from the pen. NOTE: This sheet is a summary. It may not cover all possible information. If you have questions about this medicine, talk to your doctor, pharmacist, or health care provider.  2018 Elsevier/Gold Standard (2016-02-03 14:41:37)  Phentermine; Topiramate extended-release capsules What is this medicine? Phentermine; topiramate (FEN ter meen; toe PYRE a mate) is a combination of two medicines used with a reduced calorie diet and exercise to help you lose weight. This medicine is only available through certified pharmacies enrolled in a special program. Your healthcare professional will tell you where you can get your medicine. If you have additional questions, you can visit the manufacture's website at [email or web address] or contact them by phone at [phone]. This medicine may be used for other purposes; ask your health care provider or pharmacist if you have questions. COMMON BRAND NAME(S): Qsymia What should I tell my health care provider before I take this medicine? They need to know if you have any of these  conditions: -agitation -diarrhea -depression or other mental illness -diabetes -glaucoma -heart disease -high or low blood pressure -history of anorexia or other eating disorder -history of substance abuse -kidney stones or kidney disease -liver disease -lung disease like asthma, obstructive pulmonary disease, emphysema -metabolic acidosis -on a ketogenic diet -scheduled for surgery or a procedure -suicidal thoughts, plans, or attempt; a previous suicide attempt by you or a family member -taken an MAOI  like Carbex, Eldepryl, Marplan, Nardil, or Parnate in last 14 days -thyroid disease -an unusual or allergic reaction to phentermine, topiramate, other medicines, foods, dyes, or preservatives -pregnant or trying to get pregnant -breast-feeding How should I use this medicine? Take this medicine by mouth with a glass of water. Follow the directions on the prescription label. Do not crush or chew. This medicine is usually taken with or without food once per day in the morning. Avoid taking this medicine in the evening. It may interfere with sleep. Take your doses at regular intervals. Do not take your medicine more often than directed. A special MedGuide will be given to you by the pharmacist with each prescription and refill. Be sure to read this information carefully each time. Talk to your pediatrician regarding the use of this medicine in children. Special care may be needed. Overdosage: If you think you have taken too much of this medicine contact a poison control center or emergency room at once. NOTE: This medicine is only for you. Do not share this medicine with others. What if I miss a dose? If you miss a dose, take it as soon as you can. If it is almost time for your next dose, take only that dose. Do not take double or extra doses. What may interact with this medicine? Do not take this medicine with any of the following medications: -MAOIs like Carbex, Eldepryl, Marplan, Nardil, and Parnate This medicine may also interact with the following medications: -acetazolamide -amitriptyline -antihistamines for allergy, cough and cold -atropine -birth control pills -carbamazepine -certain medicines for bladder problems like oxybutynin, tolterodine -certain medicines for depression, anxiety, or psychotic disturbances -certain medicines for Parkinson's disease like benztropine, trihexyphenidyl -certain medicines for stomach problems like dicyclomine, hyoscyamine -certain medicines for travel  sickness like scopolamine -dichlorphenamide -digoxin -diltiazem -diuretics -hydrochlorothiazide -ipratropium -lithium -medicines for diabetes -medicines for pain, sleep, or muscle relaxation -methazolamide -phenytoin -pioglitazone -stimulant medicines for attention disorders, weight loss, or to stay awake -valproic acid -zonisamide This list may not describe all possible interactions. Give your health care provider a list of all the medicines, herbs, non-prescription drugs, or dietary supplements you use. Also tell them if you smoke, drink alcohol, or use illegal drugs. Some items may interact with your medicine. What should I watch for while using this medicine? Visit your doctor or health care professional for regular checks on your progress. This medicine is intended to be used in addition to a healthy diet and appropriate exercise. The best results are achieved this way. Do not increase or in any way change your dose without consulting your doctor or health care professional. Do not take this medicine within 6 hours of bedtime. It can keep you from getting to sleep. Avoid drinks that contain caffeine and try to stick to a regular bedtime every night. Do not stop taking this medicine suddenly. This increases the risk of seizures. This medicine can decrease sweating and increase your body temperature. Watch for signs of deceased sweating or fever. Avoid extreme heat, hot baths, and saunas. Be careful about exercising, especially  in hot weather. Contact your health care provider right away if you notice a fever or decrease in sweating. You should drink plenty of fluids while taking this medicine. If you have had kidney stones in the past, this will help to reduce your chances of forming kidney stones. If you have stomach pain, with nausea or vomiting and yellowing of your eyes or skin, call your doctor immediately. You may get drowsy or dizzy. Do not drive, use machinery, or do anything that  needs mental alertness until you know how this medicine affects you. Do not stand or sit up quickly, especially if you are an older patient. This reduces the risk of dizzy or fainting spells. Alcohol may increase dizziness and drowsiness. Avoid alcoholic drinks. This medicine may affect blood sugar levels. If you have diabetes, check with your doctor or health care professional before you change your diet or the dose of your diabetic medicine. Patients and their families should watch out for worsening depression or thoughts of suicide. Also watch out for sudden changes in feelings such as feeling anxious, agitated, panicky, irritable, hostile, aggressive, impulsive, severely restless, overly excited and hyperactive, or not being able to sleep. If this happens, especially at the beginning of treatment or after a change in dose, call your health care professional. If you notice blurred vision, eye pain, or other eye problems, seek medical attention at once for an eye exam. This medicine may increase the chance of developing metabolic acidosis. If left untreated, this can cause kidney stones, bone disease, or slowed growth in children. Symptoms include breathing fast, fatigue, loss of appetite, irregular heartbeat, or loss of consciousness. Call your doctor immediately if you experience any of these side effects. Also, tell your doctor about any surgery you plan on having while taking this medicine since this may increase your risk for metabolic acidosis. Women who become pregnant while using this medicine should contact their physician immediately. You should also contact The Qsymia Pregnancy Surveillance Program which is a program that monitors pregnancies that occur during treatment. Contact the program by calling [phone]. What side effects may I notice from receiving this medicine? Side effects that you should report to your doctor or health care professional as soon as possible: -allergic reactions  like skin rash, itching or hives, swelling of the face, lips, or tongue -blood in the urine -changes in vision -chest pain or chest tightness -confusion -depressed mood -difficulty breathing -dizziness -fast or irregular heartbeat -feeling anxious -irritable -loss of appetite -low blood pressure -pain in the lower back or side -pain, tingling, numbness in the hands or feet -pain when urinating -palpitations -redness, blistering, peeling or loosening of the skin, including inside the mouth -shortness of breath -suicidal thoughts or other mood changes -trouble passing urine or change in the amount of urine -trouble walking, dizziness, loss of balance or coordination -unusually weak or tired -vomiting Side effects that usually do not require medical attention (report to your doctor or health care professional if they continue or are bothersome): -change in sex drive or performance -changes in vision -constipation -diarrhea -dry mouth -headache -nausea -tremors -trouble sleeping -upset stomach This list may not describe all possible side effects. Call your doctor for medical advice about side effects. You may report side effects to FDA at 1-800-FDA-1088. Where should I keep my medicine? Keep out of the reach of children. This medicine can be abused. Keep your medicine in a safe place to protect it from theft. Do not share this medicine with  anyone. Selling or giving away this medicine is dangerous and against the law. This medicine may cause accidental overdose and death if taken by other adults, children, or pets. Mix any unused medicine with a substance like cat litter or coffee grounds. Then throw the medicine away in a sealed container like a sealed bag or a coffee can with a lid. Do not use the medicine after the expiration date. Store at room temperature between 15 and 25 degrees C (59 and 77 degrees F). NOTE: This sheet is a summary. It may not cover all possible  information. If you have questions about this medicine, talk to your doctor, pharmacist, or health care provider.  2018 Elsevier/Gold Standard (2015-02-18 14:30:46)

## 2017-01-30 DIAGNOSIS — F909 Attention-deficit hyperactivity disorder, unspecified type: Secondary | ICD-10-CM

## 2017-01-30 HISTORY — DX: Attention-deficit hyperactivity disorder, unspecified type: F90.9

## 2017-01-30 HISTORY — PX: ACHILLES TENDON REPAIR: SUR1153

## 2017-02-06 ENCOUNTER — Other Ambulatory Visit: Payer: Self-pay | Admitting: Family Medicine

## 2017-02-07 MED ORDER — ESCITALOPRAM OXALATE 20 MG PO TABS: 20.0000 mg | ORAL_TABLET | Freq: Every day | ORAL | 0 refills | Status: DC

## 2017-02-07 NOTE — Telephone Encounter (Signed)
Requested rx sent to pharmacy

## 2017-02-12 ENCOUNTER — Encounter: Payer: Self-pay | Admitting: Family Medicine

## 2017-02-12 ENCOUNTER — Telehealth: Payer: Self-pay | Admitting: Family Medicine

## 2017-02-12 ENCOUNTER — Other Ambulatory Visit: Payer: Self-pay | Admitting: *Deleted

## 2017-02-12 ENCOUNTER — Other Ambulatory Visit: Payer: Self-pay | Admitting: Family Medicine

## 2017-02-12 MED ORDER — ESCITALOPRAM OXALATE 20 MG PO TABS: 20.0000 mg | ORAL_TABLET | Freq: Every day | ORAL | 0 refills | Status: DC

## 2017-02-12 NOTE — Telephone Encounter (Signed)
Patient states that she is having side effects from not having lexapro.  She is having a shocking feeling, dizziness, and slow thinking process.  Insurance will not cover lexapro until the 17th of January.  Is there anything patient can do until the 17th to help with symptoms

## 2017-02-12 NOTE — Telephone Encounter (Signed)
Covering for pcp  I would recommend simply buying a small amount without insuarnce. A 30 day supply is $9.00 at [facility] and so it should be a reasonable price.   Murtis SinkSam Bradshaw, MD [facility] 02/12/2017, 4:06 PM

## 2017-02-12 NOTE — Telephone Encounter (Signed)
Patient will pick up Rx from [facility]

## 2017-02-12 NOTE — Addendum Note (Signed)
Addended by: Elenora GammaBRADSHAW, SAMUEL L on: 02/12/2017 04:07 PM   Modules accepted: Orders

## 2017-02-28 ENCOUNTER — Encounter: Payer: BC Managed Care – PPO | Attending: Family Medicine | Admitting: Nutrition

## 2017-02-28 VITALS — Ht 61.0 in | Wt 211.0 lb

## 2017-02-28 DIAGNOSIS — E782 Mixed hyperlipidemia: Secondary | ICD-10-CM

## 2017-02-28 DIAGNOSIS — E669 Obesity, unspecified: Secondary | ICD-10-CM

## 2017-02-28 DIAGNOSIS — Z6839 Body mass index (BMI) 39.0-39.9, adult: Secondary | ICD-10-CM | POA: Diagnosis not present

## 2017-02-28 DIAGNOSIS — E282 Polycystic ovarian syndrome: Secondary | ICD-10-CM

## 2017-02-28 NOTE — Progress Notes (Signed)
Medical Nutrition Therapy:  Appt start time: 1530 end time:  1530. Assessment:  Primary concerns today: Overweight and  Mixed Hyperlipidemia and Metabolic Syndrome. Has PCOS and anxiety. Has been on Lexapro about a year. This has helped her anxiety and depression. Has gained about 15-20 lbs in the last year or two. Recently went thru a divorce and back living with her mom. Prefers to weigh 175-180 lbs.. Desires  A healthy lifestyle. Is a Clinical biochemist. Is on Phenteramine since November 2018. Kim Klein takes during week due to insomnia at times when taking it. Having more insomnia now with going to bed at 12-2 am.Has lost about 6 lbs since being on it. Lives with her mother.  She does the shopping and cooking.  Eats 2 meals per day. No physical activity outside of her job. 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 Metformin 500 mg BID for her PCOS.       Current diet is high in processed foods that are high in calories in fat, carbs as well as sodium. Diet is insuffient in fresh fruits and vegetables.     She is willing to make changes with her diet and exercise to provide desired weight loss and improve her PCOS. CMP Latest Ref Rng & Units 11/20/2016 08/30/2015 07/18/2013  Glucose 65 - 99 mg/dL 16(X) 76 74  BUN 6 - 20 mg/dL 9 11 10   Creatinine 0.57 - 1.00 mg/dL 0.96 0.45 4.09  Sodium 134 - 144 mmol/L 140 139 140  Potassium 3.5 - 5.2 mmol/L 4.9 4.4 4.0  Chloride 96 - 106 mmol/L 104 105 103  CO2 20 - 29 mmol/L 22 19 24   Calcium 8.7 - 10.2 mg/dL 8.9 8.1(X) 9.1  Total Protein 6.0 - 8.5 g/dL 6.8 6.4 6.4  Total Bilirubin 0.0 - 1.2 mg/dL <9.1 <4.7 0.3  Alkaline Phos 39 - 117 IU/L 48 49 56  AST 0 - 40 IU/L 15 18 12   ALT 0 - 32 IU/L 12 11 9    Lipid Panel     Component Value Date/Time   CHOL 214 (H) 11/20/2016 1728   TRIG 79 11/20/2016 1728   HDL 39 (L)  11/20/2016 1728   CHOLHDL 5.5 (H) 11/20/2016 1728   CHOLHDL 3.5 07/02/2012 1808   VLDL 15 07/02/2012 1808   LDLCALC 159 (H) 11/20/2016 1728   Lab Results  Component Value Date   HGBA1C 4.7 04/26/2012    Preferred Learning Style:     No preference indicated   Learning Readiness:   Ready  Change in progress   MEDICATIONS: see list.    DIETARY INTAKE:    24-hr recall:  B ( 9 AM): burger king-crissant bacon and cheese, 4 hash browns, 10 oz Sprite Snk ( AM): skips  L ( PM): 12-1 pm bag of doritos. Snk ( PM):  D ( PM): 10 pm; Chicken nuggets,fries and strawberry fanta Snk ( PM): water Beverages: water1-2 and sodas  Usual physical activity: ADL  Estimated energy needs: 1500  calories 170 g carbohydrates 112 g protein 42 g fat  Progress Towards Goal(s):  In progress.   Nutritional Diagnosis:  NB-1.1 Food and nutrition-related knowledge deficit As related to OBesity and Hyperlipidemia.  As evidenced by BMI 39 and TCHOL 214, LDL 159 and Low HDL of 39.    Intervention:  Nutrition education on healthy weight loss  tips, MyPlate, portion sizes, high fiber low fat low sodium diet, avoiding processed foods, benefits of more plant based foods, drinking water and cutting out refined foods and fast foods. Benefits of exercise 60 minutes 4 times per week. Keeping a food journal and using a food tracker like My FitnessPal or similar for better engagement.  Goals 1. Exercise 45 mins three times per week. 2. Follow The Plate Method 3. Increase water to 3 bottles per day. 4. Lose 1-2 lbs per week. Cut out fast food, processed foods, soda, juices, sugar beverages. Increase fresh fruits and vegetables. Eat three balanced meals per day. Lose 1-2 lbs per week.  Teaching Method Utilized:  Visual Auditory Hands on  Handouts given during visit include:  The Plate Method   Meal Plan Card   Barriers to learning/adherence to lifestyle change: none  Demonstrated degree of  understanding via:  Teach Back   Monitoring/Evaluation:  Dietary intake, exercise, meal planning, and body weight in 1 month(s). May consider getting an A1C level checked to rule out Type 2 DM or prediabetes with her metabolic syndrome.

## 2017-02-28 NOTE — Patient Instructions (Addendum)
Goals 1. Exercise 45 mins three times per week. 2. Follow The Plate Method 3. Increase water to 3 bottles per day. 4. Lose 1-2 lbs per week. Cut out fast food, processed foods, soda, juices, sugar beverages. Increase fresh fruits and vegetables. Eat three balanced meals per day. Lose 1-2 lbs per week.

## 2017-03-02 ENCOUNTER — Other Ambulatory Visit: Payer: Self-pay | Admitting: Family Medicine

## 2017-03-06 ENCOUNTER — Encounter: Payer: Self-pay | Admitting: Nutrition

## 2017-03-14 ENCOUNTER — Other Ambulatory Visit: Payer: Self-pay

## 2017-03-14 DIAGNOSIS — Z3041 Encounter for surveillance of contraceptive pills: Secondary | ICD-10-CM

## 2017-03-14 MED ORDER — LEVONORGESTREL-ETHINYL ESTRAD 0.15-30 MG-MCG PO TABS: 1.0000 | ORAL_TABLET | Freq: Every day | ORAL | 1 refills | Status: DC

## 2017-04-05 ENCOUNTER — Encounter: Payer: BC Managed Care – PPO | Attending: Family Medicine | Admitting: Nutrition

## 2017-04-05 VITALS — Wt 207.0 lb

## 2017-04-05 DIAGNOSIS — E282 Polycystic ovarian syndrome: Secondary | ICD-10-CM | POA: Diagnosis not present

## 2017-04-05 DIAGNOSIS — E669 Obesity, unspecified: Secondary | ICD-10-CM | POA: Diagnosis not present

## 2017-04-05 DIAGNOSIS — Z713 Dietary counseling and surveillance: Secondary | ICD-10-CM | POA: Insufficient documentation

## 2017-04-05 NOTE — Progress Notes (Signed)
Medical Nutrition Therapy:  Appt start time: 1630 end time:  1700 Asessment:  Primary concerns today: Overweight and  Mixed Hyperlipidemia and Metabolic Syndrome.   Lost 4 lbs!. Bought air airfyer and loves it. Has been more intentional of being active. Try to make better choices in higher fiber foods.     She is willing to make changes with her diet and exercise to provide desired weight loss and improve her PCOS. CMP Latest Ref Rng & Units 11/20/2016 08/30/2015 07/18/2013  Glucose 65 - 99 mg/dL 16(X) 76 74  BUN 6 - 20 mg/dL 9 11 10   Creatinine 0.57 - 1.00 mg/dL 0.96 0.45 4.09  Sodium 134 - 144 mmol/L 140 139 140  Potassium 3.5 - 5.2 mmol/L 4.9 4.4 4.0  Chloride 96 - 106 mmol/L 104 105 103  CO2 20 - 29 mmol/L 22 19 24   Calcium 8.7 - 10.2 mg/dL 8.9 8.1(X) 9.1  Total Protein 6.0 - 8.5 g/dL 6.8 6.4 6.4  Total Bilirubin 0.0 - 1.2 mg/dL <9.1 <4.7 0.3  Alkaline Phos 39 - 117 IU/L 48 49 56  AST 0 - 40 IU/L 15 18 12   ALT 0 - 32 IU/L 12 11 9    Lipid Panel     Component Value Date/Time   CHOL 214 (H) 11/20/2016 1728   TRIG 79 11/20/2016 1728   HDL 39 (L) 11/20/2016 1728   CHOLHDL 5.5 (H) 11/20/2016 1728   CHOLHDL 3.5 07/02/2012 1808   VLDL 15 07/02/2012 1808   LDLCALC 159 (H) 11/20/2016 1728   Lab Results  Component Value Date   HGBA1C 4.7 04/26/2012   Wt Readings from Last 3 Encounters:  04/05/17 207 lb (93.9 kg)  02/28/17 211 lb (95.7 kg)  12/20/16 213 lb (96.6 kg)   Ht Readings from Last 3 Encounters:  02/28/17 5\' 1"  (1.549 m)  12/20/16 5\' 2"  (1.575 m)  11/20/16 5\' 2"  (1.575 m)   Body mass index is 39.11 kg/m.  Preferred Learning Style:     No preference indicated   Learning Readiness:   Ready  Change in progress   MEDICATIONS: see list.    DIETARY INTAKE:    24-hr recall:  B ( 9 AM):WHole wheat cherios and milk,  Snk ( AM):  L ( PM): [place] salad, or home cooked foods, water, Snk ( PM):  D ( PM): [place], baked beans and whole wheat light bread,  water, Snk ( PM): water Beverages: water1-2  Usual physical activity: ADL  Estimated energy needs: 1500  calories 170 g carbohydrates 112 g protein 42 g fat  Progress Towards Goal(s):  In progress.   Nutritional Diagnosis:  NB-1.1 Food and nutrition-related knowledge deficit As related to OBesity and Hyperlipidemia.  As evidenced by BMI 39 and TCHOL 214, LDL 159 and Low HDL of 39.    Intervention:  Nutrition education on healthy weight loss tips, MyPlate, portion sizes, high fiber low fat low sodium diet, avoiding processed foods, benefits of more plant based foods, drinking water and cutting out refined foods and fast foods. Benefits of exercise 60 minutes 4 times per week. Keeping a food journal and using a food tracker like My FitnessPal or similar for better engagement.   Goals 1. Keep up the great job! Increase fresh fruits and vegetables. Lose 2-3 lbs per month Keep preparing meals ahead of time.  Teaching Method Utilized:  Visual Auditory Hands on  Handouts given during visit include:  The Plate Method   Meal Plan Card   Barriers to  learning/adherence to lifestyle change: none  Demonstrated degree of understanding via:  Teach Back   Monitoring/Evaluation:  Dietary intake, exercise, meal planning, and body weight in 3 month(s). May consider getting an A1C level checked to rule out Type 2 DM or prediabetes with her metabolic syndrome.

## 2017-04-05 NOTE — Patient Instructions (Addendum)
Goals 1. Keep up the great job! Increase fresh fruits and vegetables. Lose 2-3 lbs per month Keep preparing meals ahead of time.

## 2017-04-16 ENCOUNTER — Encounter: Payer: Self-pay | Admitting: Nutrition

## 2017-05-15 ENCOUNTER — Other Ambulatory Visit: Payer: Self-pay | Admitting: Physician Assistant

## 2017-05-15 DIAGNOSIS — W57XXXA Bitten or stung by nonvenomous insect and other nonvenomous arthropods, initial encounter: Secondary | ICD-10-CM

## 2017-05-24 ENCOUNTER — Encounter: Payer: Self-pay | Admitting: Family Medicine

## 2017-05-24 ENCOUNTER — Other Ambulatory Visit: Payer: Self-pay | Admitting: Family Medicine

## 2017-05-24 DIAGNOSIS — E282 Polycystic ovarian syndrome: Secondary | ICD-10-CM

## 2017-05-25 ENCOUNTER — Other Ambulatory Visit: Payer: Self-pay | Admitting: Family Medicine

## 2017-05-25 MED ORDER — VITAMIN D (ERGOCALCIFEROL) 1.25 MG (50000 UNIT) PO CAPS: 50000.0000 [IU] | ORAL_CAPSULE | ORAL | 0 refills | Status: DC

## 2017-05-25 MED ORDER — METFORMIN HCL ER 500 MG PO TB24: 1000.0000 mg | ORAL_TABLET | Freq: Every day | ORAL | 0 refills | Status: DC

## 2017-06-05 ENCOUNTER — Ambulatory Visit: Payer: BC Managed Care – PPO | Admitting: Family Medicine

## 2017-06-05 ENCOUNTER — Encounter: Payer: Self-pay | Admitting: Family Medicine

## 2017-06-05 VITALS — BP 125/88 | HR 81 | Temp 98.1°F | Ht 61.0 in | Wt 198.0 lb

## 2017-06-05 DIAGNOSIS — F430 Acute stress reaction: Secondary | ICD-10-CM

## 2017-06-05 DIAGNOSIS — F411 Generalized anxiety disorder: Secondary | ICD-10-CM | POA: Diagnosis not present

## 2017-06-05 MED ORDER — LORAZEPAM 0.5 MG PO TABS: 0.5000 mg | ORAL_TABLET | Freq: Two times a day (BID) | ORAL | 0 refills | Status: DC | PRN

## 2017-06-05 NOTE — Progress Notes (Signed)
Subjective: CC: Mood/ ?PMDD PCP: Raliegh Ip, DO ZOX:WRUEAVW H Kidney is a 37 y.o. female presenting to clinic today for:  1. Stress  Patient notes significant increase in anxiety over the last couple weeks.  She notes that her mood tends to fluctuate and has episodes of crying.  She reports a lot of stress at work, at school and within her church.  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  She has significant other who is a Education officer, environmental and lives elsewhere.  She notes that they have been in a long distance relationship for about 4 years.  She cites that she has been ruminating on issues within her past marriage and projecting them onto her current partner.  She voiced concerns with regards to the longevity of her current relationship and its future.  She notes internal turmoil and wonders if she is coming to terms with the need for her current relationship to end.  She does not have a current outlet to discuss her increased anxiety.  She reports that she used to see a counselor at work but did not find that this was very beneficial.  She reports compliance with Lexapro 20 mg.  She notes poor sleep, citing that she goes to sleep but does not feel like she is going into a deep sleep.  She notes that she actually missed a couple of days of work recently because she was feeling so overwhelmed and so poorly.  She isolated and "zoned out".  She reports that she is currently living with her family now and that they went to the beach.  She notes having felt angry that they returned from the beach.  She has not taken any time for herself in quite some time.  She reports increased stress at school citing that she has term papers that are coming to but finds little energy and difficulty concentrating order to complete them.   ROS: Per HPI  Allergies  Allergen Reactions  . Augmentin [Amoxicillin-Pot Clavulanate] Other (See Comments)    Stomach pain and  rectal bleeding  . Cinnamon   . Strawberry Extract    Past Medical History:  Diagnosis Date  . Depression   . PCOS (polycystic ovarian syndrome)     Current Outpatient Medications:  .  cetirizine (ZYRTEC) 10 MG tablet, TAKE 1 TABLET (10 MG TOTAL) BY MOUTH DAILY., Disp: 90 tablet, Rfl: 1 .  cyclobenzaprine (FLEXERIL) 5 MG tablet, Take 1 tablet (5 mg total) by mouth 3 (three) times daily as needed for muscle spasms. (Patient not taking: Reported on 02/28/2017), Disp: 30 tablet, Rfl: 1 .  escitalopram (LEXAPRO) 20 MG tablet, Take 1 tablet (20 mg total) by mouth daily., Disp: 5 tablet, Rfl: 0 .  levonorgestrel-ethinyl estradiol (LILLOW) 0.15-30 MG-MCG tablet, Take 1 tablet by mouth daily., Disp: 84 tablet, Rfl: 1 .  metFORMIN (GLUCOPHAGE-XR) 500 MG 24 hr tablet, Take 2 tablets (1,000 mg total) by mouth daily with supper. (Patient not taking: Reported on 06/05/2017), Disp: 60 tablet, Rfl: 0 .  phentermine (ADIPEX-P) 37.5 MG tablet, Take 0.5-1 tablets (18.75-37.5 mg total) by mouth daily before breakfast. (Patient not taking: Reported on 06/05/2017), Disp: 60 tablet, Rfl: 0 .  Vitamin D, Ergocalciferol, (DRISDOL) 50000 units CAPS capsule, Take 1 capsule (50,000 Units total) by mouth every 7 (seven) days., Disp: 12 capsule, Rfl: 0 Social History   Socioeconomic History  . Marital status: Divorced    Spouse name: Not on file  .  Number of children: Not on file  . Years of education: Not on file  . Highest education level: Not on file  Occupational History  . Not on file  Social Needs  . Financial resource strain: Not on file  . Food insecurity:    Worry: Not on file    Inability: Not on file  . Transportation needs:    Medical: Not on file    Non-medical: Not on file  Tobacco Use  . Smoking status: Never Smoker  . Smokeless tobacco: Never Used  Substance and Sexual Activity  . Alcohol use: No    Alcohol/week: 0.0 oz  . Drug use: No  . Sexual activity: Yes    Birth control/protection:  Pill  Lifestyle  . Physical activity:    Days per week: Not on file    Minutes per session: Not on file  . Stress: Not on file  Relationships  . Social connections:    Talks on phone: Not on file    Gets together: Not on file    Attends religious service: Not on file    Active member of club or organization: Not on file    Attends meetings of clubs or organizations: Not on file    Relationship status: Not on file  . Intimate partner violence:    Fear of current or ex partner: Not on file    Emotionally abused: Not on file    Physically abused: Not on file    Forced sexual activity: Not on file  Other Topics Concern  . Not on file  Social History Narrative  . Not on file   Family History  Problem Relation Age of Onset  . Cancer Maternal Aunt        BREAST  . Cancer Maternal Grandmother        BREAST  . COPD Maternal Grandmother   . Diabetes Maternal Grandmother     Objective: Office vital signs reviewed. BP 125/88   Pulse 81   Temp 98.1 F (36.7 C) (Oral)   Ht  (1.549 m)   Wt 198 lb (89.8 kg)   BMI 37.41 kg/m   Physical Examination:  General: Awake, alert, well nourished, No acute distress Psych: Mood somewhat depressed, speech normal, affect appropriate.  Patient is intermittently tearful though tries to smile through her tears.  Depression screen [facility] 2/9 06/05/2017 12/20/2016 11/20/2016  Decreased Interest Down, Depressed, Hopeless PHQ - 2 Score Altered sleeping Tired, decreased energy Change in appetite Feeling bad or failure about yourself  1 0 2  Trouble concentrating 3 0 1  Moving slowly or fidgety/restless 0 - 0  Suicidal thoughts 0 0 -  PHQ-9 Score GAD 7 : Generalized Anxiety Score 06/05/2017 12/20/2016 06/29/2015  Nervous, Anxious, on Edge Control/stop worrying Worry too much - different things Trouble relaxing Restless 0 1 0  Easily annoyed or irritable Afraid - awful might happen 1 0 3  Total GAD 7 Score Anxiety Difficulty - - Very difficult    Assessment/ Plan: 37 y.o. female   1. Stress reaction I think that patient at baseline is an empathetic person and is needing to re-center herself.  She seems to  be having a normal stress reaction to the multiple stressors and obligations she has.  We did discuss for quite some time need for self-love and self-care.  Setting boundaries will be essential.  I offered her referral to counseling but she is not quite ready for this yet.  Continue Lexapro.  Add Ativan 0.5 mg p.o. twice daily PRN.  Should she be requiring this more than twice per week, I did recommend that she come back and see me sooner.  I did caution sedation and advised her to avoid alcohol.  She will follow-up with me in 4 weeks or sooner if needed.  Notes were given for both school and work.  I did recommend that she take the remainder of the week off to allow herself to rest and recover.  2. GAD (generalized anxiety disorder) As above.  Meds ordered this encounter  Medications  . LORazepam (ATIVAN) 0.5 MG tablet    Sig: Take 1 tablet (0.5 mg total) by mouth 2 (two) times daily as needed for anxiety.    Dispense:  20 tablet    Refill:  0   The Narcotic Database has been reviewed.  There were no red flags.    Total time spent with patient 36 minutes.  Greater than 50% of encounter spent in coordination of care/counseling.  Raliegh Ip, DO [facility] [phone]

## 2017-06-05 NOTE — Patient Instructions (Signed)

## 2017-06-16 ENCOUNTER — Encounter: Payer: Self-pay | Admitting: Family Medicine

## 2017-06-26 ENCOUNTER — Encounter: Payer: Self-pay | Admitting: Family Medicine

## 2017-07-04 NOTE — Progress Notes (Deleted)
Subjective: CC: f/u anxiety PCP: Raliegh Ip, DO ZOX:WRUEAVW H Crickenberger is a 37 y.o. female presenting to clinic today for:  1. Stress reaction/anxiety Patient was seen about 1 month ago for stress reaction and anxiety.  She was instructed to take some time off from work and school.  She was also given as needed Ativan to use in conjunction with her Lexapro and was instructed that if she was requiring this multiple times a week that she should follow-up for reevaluation.  Counseling services were offered at that time but patient declined. ***   ROS: Per HPI  Allergies  Allergen Reactions  . Augmentin [Amoxicillin-Pot Clavulanate] Other (See Comments)    Stomach pain and rectal bleeding  . Cinnamon   . Strawberry Extract    Past Medical History:  Diagnosis Date  . Depression   . PCOS (polycystic ovarian syndrome)     Current Outpatient Medications:  .  cetirizine (ZYRTEC) 10 MG tablet, TAKE 1 TABLET (10 MG TOTAL) BY MOUTH DAILY., Disp: 90 tablet, Rfl: 1 .  cyclobenzaprine (FLEXERIL) 5 MG tablet, Take 1 tablet (5 mg total) by mouth 3 (three) times daily as needed for muscle spasms. (Patient not taking: Reported on 02/28/2017), Disp: 30 tablet, Rfl: 1 .  escitalopram (LEXAPRO) 20 MG tablet, Take 1 tablet (20 mg total) by mouth daily., Disp: 5 tablet, Rfl: 0 .  levonorgestrel-ethinyl estradiol (LILLOW) 0.15-30 MG-MCG tablet, Take 1 tablet by mouth daily., Disp: 84 tablet, Rfl: 1 .  LORazepam (ATIVAN) 0.5 MG tablet, Take 1 tablet (0.5 mg total) by mouth 2 (two) times daily as needed for anxiety., Disp: 20 tablet, Rfl: 0 .  metFORMIN (GLUCOPHAGE-XR) 500 MG 24 hr tablet, Take 2 tablets (1,000 mg total) by mouth daily with supper. (Patient not taking: Reported on 06/05/2017), Disp: 60 tablet, Rfl: 0 .  Vitamin D, Ergocalciferol, (DRISDOL) 50000 units CAPS capsule, Take 1 capsule (50,000 Units total) by mouth every 7 (seven) days., Disp: 12 capsule, Rfl: 0 Social History    Socioeconomic History  . Marital status: Divorced    Spouse name: Not on file  . Number of children: Not on file  . Years of education: Not on file  . Highest education level: Not on file  Occupational History  . Not on file  Social Needs  . Financial resource strain: Not on file  . Food insecurity:    Worry: Not on file    Inability: Not on file  . Transportation needs:    Medical: Not on file    Non-medical: Not on file  Tobacco Use  . Smoking status: Never Smoker  . Smokeless tobacco: Never Used  Substance and Sexual Activity  . Alcohol use: No    Alcohol/week: 0.0 oz  . Drug use: No  . Sexual activity: Yes    Birth control/protection: Pill  Lifestyle  . Physical activity:    Days per week: Not on file    Minutes per session: Not on file  . Stress: Not on file  Relationships  . Social connections:    Talks on phone: Not on file    Gets together: Not on file    Attends religious service: Not on file    Active member of club or organization: Not on file    Attends meetings of clubs or organizations: Not on file    Relationship status: Not on file  . Intimate partner violence:    Fear of current or ex partner: Not on file  Emotionally abused: Not on file    Physically abused: Not on file    Forced sexual activity: Not on file  Other Topics Concern  . Not on file  Social History Narrative  . Not on file   Family History  Problem Relation Age of Onset  . Cancer Maternal Aunt        BREAST  . Cancer Maternal Grandmother        BREAST  . COPD Maternal Grandmother   . Diabetes Maternal Grandmother     Objective: Office vital signs reviewed. There were no vitals taken for this visit.  Physical Examination:  General: Awake, alert, well nourished, No acute distress Psych: Mood somewhat depressed, speech normal, affect appropriate.  Patient is intermittently tearful though tries to smile through her tears.  [facility] 06/05/2017 12/20/2016  11/20/2016  Decreased Interest 3 1 2   Down, Depressed, Hopeless 2 1 2   PHQ - 2 Score 5 2 4   Altered sleeping 2 1 1   Tired, decreased energy 2 1 2   Change in appetite 2 2 1   Feeling bad or failure about yourself  1 0 2  Trouble concentrating 3 0 1  Moving slowly or fidgety/restless 0 - 0  Suicidal thoughts 0 0 -  PHQ-9 Score 15 6 11    GAD 7 : Generalized Anxiety Score 06/05/2017 12/20/2016 06/29/2015  Nervous, Anxious, on Edge 3 1 2   Control/stop worrying 1 2 3   Worry too much - different things 2 2 3   Trouble relaxing 1 1 3   Restless 0 1 0  Easily annoyed or irritable 3 2 2   Afraid - awful might happen 1 0 3  Total GAD 7 Score 11 9 16   Anxiety Difficulty - - Very difficult    Assessment/ Plan: 37 y.o. female   1. Stress reaction *** 2. GAD (generalized anxiety disorder) As above.  No orders of the defined types were placed in this encounter.  The Narcotic Database has been reviewed.  There were no red flags.    Total time spent with patient 36 minutes.  Greater than 50% of encounter spent in coordination of care/counseling.  Raliegh IpAshly M Gottschalk, DO [facility] [phone]

## 2017-07-06 ENCOUNTER — Ambulatory Visit: Payer: BC Managed Care – PPO | Admitting: Family Medicine

## 2017-07-11 ENCOUNTER — Encounter: Payer: Self-pay | Admitting: Family Medicine

## 2017-07-13 NOTE — Progress Notes (Deleted)
Subjective: CC: f/u anxiety PCP: Raliegh IpGottschalk, Daneshia Tavano M, DO OZH:[id number]:Kim Klein is a 37 y.o. female presenting to clinic today for:  1. Stress reaction/anxiety Patient was seen about 1 month ago for stress reaction and anxiety.  She was instructed to take some time off from work and school.  She was also given as needed Ativan to use in conjunction with her Lexapro and was instructed that if she was requiring this multiple times a week that she should follow-up for reevaluation.  Counseling services were offered at that time but patient declined. ***  2. ***  ROS: Per HPI  Allergies  Allergen Reactions  . Augmentin [Amoxicillin-Pot Clavulanate] Other (See Comments)    Stomach pain and rectal bleeding  . Cinnamon   . Strawberry Extract    Past Medical History:  Diagnosis Date  . Depression   . PCOS (polycystic ovarian syndrome)     Current Outpatient Medications:  .  cetirizine (ZYRTEC) 10 MG tablet, TAKE 1 TABLET (10 MG TOTAL) BY MOUTH DAILY., Disp: 90 tablet, Rfl: 1 .  cyclobenzaprine (FLEXERIL) 5 MG tablet, Take 1 tablet (5 mg total) by mouth 3 (three) times daily as needed for muscle spasms. (Patient not taking: Reported on 02/28/2017), Disp: 30 tablet, Rfl: 1 .  escitalopram (LEXAPRO) 20 MG tablet, Take 1 tablet (20 mg total) by mouth daily., Disp: 5 tablet, Rfl: 0 .  levonorgestrel-ethinyl estradiol (LILLOW) 0.15-30 MG-MCG tablet, Take 1 tablet by mouth daily., Disp: 84 tablet, Rfl: 1 .  LORazepam (ATIVAN) 0.5 MG tablet, Take 1 tablet (0.5 mg total) by mouth 2 (two) times daily as needed for anxiety., Disp: 20 tablet, Rfl: 0 .  metFORMIN (GLUCOPHAGE-XR) 500 MG 24 hr tablet, Take 2 tablets (1,000 mg total) by mouth daily with supper. (Patient not taking: Reported on 06/05/2017), Disp: 60 tablet, Rfl: 0 .  Vitamin D, Ergocalciferol, (DRISDOL) 50000 units CAPS capsule, Take 1 capsule (50,000 Units total) by mouth every 7 (seven) days., Disp: 12 capsule, Rfl: 0 Social History    Socioeconomic History  . Marital status: Divorced    Spouse name: Not on file  . Number of children: Not on file  . Years of education: Not on file  . Highest education level: Not on file  Occupational History  . Not on file  Social Needs  . Financial resource strain: Not on file  . Food insecurity:    Worry: Not on file    Inability: Not on file  . Transportation needs:    Medical: Not on file    Non-medical: Not on file  Tobacco Use  . Smoking status: Never Smoker  . Smokeless tobacco: Never Used  Substance and Sexual Activity  . Alcohol use: No    Alcohol/week: 0.0 oz  . Drug use: No  . Sexual activity: Yes    Birth control/protection: Pill  Lifestyle  . Physical activity:    Days per week: Not on file    Minutes per session: Not on file  . Stress: Not on file  Relationships  . Social connections:    Talks on phone: Not on file    Gets together: Not on file    Attends religious service: Not on file    Active member of club or organization: Not on file    Attends meetings of clubs or organizations: Not on file    Relationship status: Not on file  . Intimate partner violence:    Fear of current or ex partner: Not on file  Emotionally abused: Not on file    Physically abused: Not on file    Forced sexual activity: Not on file  Other Topics Concern  . Not on file  Social History Narrative  . Not on file   Family History  Problem Relation Age of Onset  . Cancer Maternal Aunt        BREAST  . Cancer Maternal Grandmother        BREAST  . COPD Maternal Grandmother   . Diabetes Maternal Grandmother     Objective: Office vital signs reviewed. There were no vitals taken for this visit.  Physical Examination:  General: Awake, alert, well nourished, No acute distress Psych: Mood somewhat depressed, speech normal, affect appropriate.  Patient is intermittently tearful though tries to smile through her tears.  Depression screen [facility] 2/9 06/05/2017 12/20/2016  11/20/2016  Decreased Interest 3 1 2   Down, Depressed, Hopeless 2 1 2   PHQ - 2 Score 5 2 4   Altered sleeping 2 1 1   Tired, decreased energy 2 1 2   Change in appetite 2 2 1   Feeling bad or failure about yourself  1 0 2  Trouble concentrating 3 0 1  Moving slowly or fidgety/restless 0 - 0  Suicidal thoughts 0 0 -  PHQ-9 Score 15 6 11    GAD 7 : Generalized Anxiety Score 06/05/2017 12/20/2016 06/29/2015  Nervous, Anxious, on Edge 3 1 2   Control/stop worrying 1 2 3   Worry too much - different things 2 2 3   Trouble relaxing 1 1 3   Restless 0 1 0  Easily annoyed or irritable 3 2 2   Afraid - awful might happen 1 0 3  Total GAD 7 Score 11 9 16   Anxiety Difficulty - - Very difficult    Assessment/ Plan: 37 y.o. female  ***  No orders of the defined types were placed in this encounter.  The Narcotic Database has been reviewed.  There were no red flags.     Raliegh Ip, DO [facility] [phone]

## 2017-07-16 ENCOUNTER — Ambulatory Visit: Payer: BC Managed Care – PPO | Admitting: Nutrition

## 2017-07-16 ENCOUNTER — Encounter: Payer: Self-pay | Admitting: Family Medicine

## 2017-07-17 ENCOUNTER — Ambulatory Visit: Payer: BC Managed Care – PPO | Admitting: Family Medicine

## 2017-07-17 ENCOUNTER — Ambulatory Visit (INDEPENDENT_AMBULATORY_CARE_PROVIDER_SITE_OTHER): Payer: BC Managed Care – PPO | Admitting: Sports Medicine

## 2017-07-17 ENCOUNTER — Ambulatory Visit: Payer: BC Managed Care – PPO | Admitting: Nutrition

## 2017-07-17 VITALS — BP 118/82 | Ht 61.0 in | Wt 206.0 lb

## 2017-07-17 VITALS — BP 119/76 | HR 81 | Temp 97.6°F | Ht 61.0 in | Wt 206.0 lb

## 2017-07-17 DIAGNOSIS — S86011A Strain of right Achilles tendon, initial encounter: Secondary | ICD-10-CM | POA: Diagnosis not present

## 2017-07-17 DIAGNOSIS — E669 Obesity, unspecified: Secondary | ICD-10-CM | POA: Diagnosis not present

## 2017-07-17 DIAGNOSIS — S86001A Unspecified injury of right Achilles tendon, initial encounter: Secondary | ICD-10-CM | POA: Diagnosis not present

## 2017-07-17 DIAGNOSIS — F430 Acute stress reaction: Secondary | ICD-10-CM | POA: Diagnosis not present

## 2017-07-17 MED ORDER — PHENTERMINE HCL 37.5 MG PO TABS: 18.7500 mg | ORAL_TABLET | Freq: Every day | ORAL | 0 refills | Status: DC

## 2017-07-17 NOTE — Progress Notes (Signed)
Subjective: CC: f/u anxiety PCP: Raliegh Ip, DO ZOX:WRUEAVW H Feeback is a 37 y.o. female presenting to clinic today for:  1. Stress reaction/anxiety Patient was seen about 1 month ago for stress reaction and anxiety.  She was instructed to take some time off from work and school.  She was also given as needed Ativan to use in conjunction with her Lexapro and was instructed that if she was requiring this multiple times a week that she should follow-up for reevaluation.  Counseling services were offered at that time but patient declined.  She notes that she has not had to use Ativan but a couple of times.  Her mood has been greatly improved after taking the week off and performing self care.  She notes that she is still waiting to hear whether or not she will receive credit for her class.    2. Foot Patient reports that she injured her right foot after attempting to kill a spider.  She notes that she jumped up and essentially hyperextended her heel.  She had immediate pain after the event.  She was evaluated at [facility] emergency department where she had x-rays performed of the right ankle.  She was told that there was no fracture but they could not rule out the possibility of an injury to the Achilles heel.  She states no ultrasound or imaging studies were performed of the Achilles.  She was placed in an Ace bandage and given crutches to use.  She worries because she continues not to be able to ambulate with the right foot.  She is able to weight-bear but not able to push off.  She continues to have significant pain in the posterior heel.  No numbness or tingling.  She has mild swelling.  She is expecting to leave for a week and a half tomorrow for work.  3.  Obesity Patient reports that she has not been exercising since injury.  She notes that she is not using good foods either at this time.  She reports decreased energy.  She is wondering if we can go back to the phentermine for a  month to help with portion control and energy such that she can attempt to get some physical activity in.   ROS: Per HPI  Allergies  Allergen Reactions  . Augmentin [Amoxicillin-Pot Clavulanate] Other (See Comments)    Stomach pain and rectal bleeding  . Cinnamon   . Strawberry Extract    Past Medical History:  Diagnosis Date  . Depression   . PCOS (polycystic ovarian syndrome)     Current Outpatient Medications:  .  cetirizine (ZYRTEC) 10 MG tablet, TAKE 1 TABLET (10 MG TOTAL) BY MOUTH DAILY., Disp: 90 tablet, Rfl: 1 .  cyclobenzaprine (FLEXERIL) 5 MG tablet, Take 1 tablet (5 mg total) by mouth 3 (three) times daily as needed for muscle spasms. (Patient not taking: Reported on 02/28/2017), Disp: 30 tablet, Rfl: 1 .  escitalopram (LEXAPRO) 20 MG tablet, Take 1 tablet (20 mg total) by mouth daily., Disp: 5 tablet, Rfl: 0 .  levonorgestrel-ethinyl estradiol (LILLOW) 0.15-30 MG-MCG tablet, Take 1 tablet by mouth daily., Disp: 84 tablet, Rfl: 1 .  LORazepam (ATIVAN) 0.5 MG tablet, Take 1 tablet (0.5 mg total) by mouth 2 (two) times daily as needed for anxiety., Disp: 20 tablet, Rfl: 0 .  metFORMIN (GLUCOPHAGE-XR) 500 MG 24 hr tablet, Take 2 tablets (1,000 mg total) by mouth daily with supper. (Patient not taking: Reported on 06/05/2017), Disp: 60  tablet, Rfl: 0 .  Vitamin D, Ergocalciferol, (DRISDOL) 50000 units CAPS capsule, Take 1 capsule (50,000 Units total) by mouth every 7 (seven) days., Disp: 12 capsule, Rfl: 0 Social History   Socioeconomic History  . Marital status: Divorced    Spouse name: Not on file  . Number of children: Not on file  . Years of education: Not on file  . Highest education level: Not on file  Occupational History  . Not on file  Social Needs  . Financial resource strain: Not on file  . Food insecurity:    Worry: Not on file    Inability: Not on file  . Transportation needs:    Medical: Not on file    Non-medical: Not on file  Tobacco Use  . Smoking  status: Never Smoker  . Smokeless tobacco: Never Used  Substance and Sexual Activity  . Alcohol use: No    Alcohol/week: 0.0 oz  . Drug use: No  . Sexual activity: Yes    Birth control/protection: Pill  Lifestyle  . Physical activity:    Days per week: Not on file    Minutes per session: Not on file  . Stress: Not on file  Relationships  . Social connections:    Talks on phone: Not on file    Gets together: Not on file    Attends religious service: Not on file    Active member of club or organization: Not on file    Attends meetings of clubs or organizations: Not on file    Relationship status: Not on file  . Intimate partner violence:    Fear of current or ex partner: Not on file    Emotionally abused: Not on file    Physically abused: Not on file    Forced sexual activity: Not on file  Other Topics Concern  . Not on file  Social History Narrative  . Not on file   Family History  Problem Relation Age of Onset  . Cancer Maternal Aunt        BREAST  . Cancer Maternal Grandmother        BREAST  . COPD Maternal Grandmother   . Diabetes Maternal Grandmother     Objective: Office vital signs reviewed. BP 119/76   Pulse 81   Temp 97.6 F (36.4 C) (Oral)   Ht 5\' 1"  (1.549 m)   Wt 206 lb (93.4 kg)   BMI 38.92 kg/m   Physical Examination:  General: Awake, alert, obese, No acute distress Cardio: +2 pedal pulses. MSK: Patient arrives in crutches.  Right ankle: Patient has great reduction in active range of motion.  She is able to wiggle her toes but is unable to plantar flex or dorsiflex well.  She has mild swelling appreciated along the medial aspect of the left ankle and heel.  She is exquisitely tender to palpation along the Achilles tendon and apex of the calcaneus.  There is no gross bruising.  She does have a palpable knot within the Achilles tendon distally.  She is tender to palpation into the lower calf.  POSITIVE Thompson test. Neuro: Light touch sensation  grossly intact. Psych: Mood stable, speech normal. Affect appropriate.  Happier today.  Depression screen [facility] 2/9 07/17/2017 06/05/2017 12/20/2016  Decreased Interest 0 3 1  Down, Depressed, Hopeless 0 2 1  PHQ - 2 Score 0 5 2  Altered sleeping - 2 1  Tired, decreased energy - 2 1  Change in appetite - 2 2  Feeling bad or failure about yourself  - 1 0  Trouble concentrating - 3 0  Moving slowly or fidgety/restless - 0 -  Suicidal thoughts - 0 0  PHQ-9 Score - 15 6   GAD 7 : Generalized Anxiety Score 07/17/2017 06/05/2017 12/20/2016 06/29/2015  Nervous, Anxious, on Edge 1 3 1 2   Control/stop worrying 0 1 2 3   Worry too much - different things 1 2 2 3   Trouble relaxing 1 1 1 3   Restless 0 0 1 0  Easily annoyed or irritable 2 3 2 2   Afraid - awful might happen 1 1 0 3  Total GAD 7 Score 6 11 9 16   Anxiety Difficulty Somewhat difficult - - Very difficult    Assessment/ Plan: 37 y.o. female   1. Achilles tendon injury, right, initial encounter Concern for partial tear versus rupture.  She does have a positive Thompson test here in office.  She has exquisite tenderness to palpation to the Achilles tendon on exam today.  I was able to arrange an appointment with sports medicine center in [place], Dr. Margaretha Sheffieldraper, for evaluation under ultrasound today.  Patient is of aware of appointment and will arrive 15 minutes early for registration.  Continue to use crutches exclusively and not weight-bear until she has been evaluated.  May need rolling scooter if rupture is confirmed.  2. Stress reaction Seems to be doing better.  Minimal use of Ativan.  Continue Lexapro.  Scores for PHQ 9 and gad 7 have both decreased compared to previous visit.  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  3. Obesity (BMI 30-39.9) Has gained about 8 pounds since last visit 5 weeks ago.  Wishes to resume phentermine.  We discussed that this would not be a good long-term medication but that I  am willing to give it to her for the next month to kick start weight loss, particularly if she is unable to exercise regularly given injury.  She will follow-up with me in 4 weeks.   Meds ordered this encounter  Medications  . phentermine (ADIPEX-P) 37.5 MG tablet    Sig: Take 0.5-1 tablets (18.75-37.5 mg total) by mouth daily before breakfast.    Dispense:  30 tablet    Refill:  0   The Narcotic Database has been reviewed.  There were no red flags.    Raliegh IpAshly M Kerri Kovacik, DO [facility] [phone]

## 2017-07-17 NOTE — Patient Instructions (Signed)
You have an appointment at the sports medicine center at 130.  Please arrive 15 minutes prior to that appointment.   Achilles Tendon Rupture The Achilles tendon is a cord-like band that connects the muscles of your lower leg (calf) to your heel. An Achilles tendon rupture is an injury that involves a tear in this tendon. This tendon is the most common site of tendon tearing. What are the causes? This condition may be caused by:  Stress from a sudden stretching of the tendon. For example, this may occur when you land from a jump or when your heel drops down into a hole on uneven ground.  A hard, direct hit to the tendon.  Pushing off your foot forcefully, such as when sprinting, jumping, or changing direction while running.  What increases the risk? This condition is more likely to develop in:  Runners.  People who play sports that involve sprinting, running, or jumping.  People who play contact sports.  People with a weak Achilles tendon. Tendons can weaken from aging, repeat injuries, and chronic tendinitis.  Males who are 1730-36 years of age, especially those who do not exercise regularly.  What are the signs or symptoms? Symptoms of this condition include:  Hearing a "pop" at the time of injury.  Severe, sudden pain in the back of the ankle.  Swelling and bruising.  Inability to actively point your toes down.  Pain when standing or walking.  A feeling of giving way when you step on the affected side.  How is this diagnosed? This condition is usually diagnosed with a physical exam. During the exam, your health care provider may:  Touch the tendon and the structures around it.  Squeeze your calf to see if your foot moves.  Ask you to point and flex your foot.  Sometimes, tests are done in addition to an exam. Tests may include:  An ultrasound.  An X-ray.  MRI.  How is this treated? This condition may be treated with:  Ice applied to the area.  Pain  medicine.  Rest.  Crutches.  A cast, splint, or other device to keep the ankle from moving (keep it immobilized).  Heel wedges to reduce the stretch on your tendon as it heals.  Surgery. This option may depend on your age and your activity level.  Follow these instructions at home: If you have a splint or brace:  Do not put pressure on any part of the splint until it is fully hardened. This may take several hours.  Wear the splint or brace as told by your health care provider. Remove it only as told by your health care provider.  Loosen the splint or brace if your toes tingle, become numb, or turn cold and blue.  Do not let your splint or brace get wet if it is not waterproof.  Keep the splint or brace clean. If you have a cast:  Do not put pressure on any part of the cast until it is fully hardened. This may take several hours.  Do not stick anything inside the cast to scratch your skin. Doing that increases your risk of infection.  Check the skin around the cast every day. Tell your health care provider about any concerns.  You may put lotion on dry skin around the edges of the cast. Do not put lotion on the skin underneath the cast.  Do not let your cast get wet if it is not waterproof.  Keep the cast clean. Bathing  Do not take baths, swim, or use a hot tub until your health care provider approves. Ask your health care provider if you can take showers. You may only be allowed to take sponge baths for bathing.  If your cast, splint, or brace is not waterproof, cover it with a watertight covering when you take a bath or a shower. Managing pain, stiffness, and swelling  If directed, apply ice to the injured area. ? Put ice in a plastic bag. ? Place a towel between your skin and the bag. ? Leave the ice on for 20 minutes, 2-3 times a day.  Move your toes often to avoid stiffness and to lessen swelling.  Raise (elevate) the injured area above the level of your heart  while you are sitting or lying down. Do not dangle your leg over a chair, couch, or bed. Driving  Do not drive or operate heavy machinery while taking prescription pain medicine.  Ask your health care provider when it is safe to drive if you have a cast, splint, or brace on a leg or foot that you use for driving. Activity  Return to your normal activities as told by your health care provider. Ask your health care provider what activities are safe for you.  Do exercises only as told by your health care provider. General instructions  Do not use the injured limb to support your body weight until your health care provider says that you can. Use crutches as told by your health care provider.  Do not use any tobacco products, such as cigarettes, chewing tobacco, and e-cigarettes. Tobacco can delay bone healing. If you need help quitting, ask your health care provider.  Take over-the-counter and prescription medicines only as told by your health care provider.  Keep all follow-up visits as told by your health care provider. This is important. How is this prevented?  Warm up and stretch before being active.  Cool down and stretch after being active.  Give your body time to rest between periods of activity.  Make sure to use equipment that fits you.  Be safe and responsible while being active to avoid falls.  Each week, do at least 150 minutes of moderate-intensity exercise, such as brisk walking or water aerobics.  Spread your workouts over the whole week, instead of just working out intensely one or two days of the week.  Make slow, incremental changes in intensity, distance, or time for running or sporting activity.  Maintain physical fitness, including: ? Strength. ? Flexibility. ? Cardiovascular fitness. ? Endurance. Contact a health care provider if:  Your pain and swelling increase.  Your pain is not controlled with medicines.  You develop new, unexplained  symptoms.  Your symptoms get worse.  You cannot move your toes or foot.  You develop warmth and swelling in your foot.  You have an unexplained fever. This information is not intended to replace advice given to you by your health care provider. Make sure you discuss any questions you have with your health care provider. Document Released: 01/16/2005 Document Revised: 09/21/2015 Document Reviewed: 12/09/2014 Elsevier Interactive Patient Education  [employer].

## 2017-07-18 ENCOUNTER — Encounter: Payer: Self-pay | Admitting: Sports Medicine

## 2017-07-18 ENCOUNTER — Ambulatory Visit: Payer: BC Managed Care – PPO | Admitting: Family Medicine

## 2017-07-18 NOTE — Progress Notes (Signed)
   Subjective:    Patient ID: Kim Klein, female    DOB: 29-Jul-1980, 37 y.o.   MRN: [id number]  HPI chief complaint: Right ankle pain  Very pleasant 37 year old female comes in today complaining of right ankle pain that began acutely on Sunday. Patient was attempting to kill a spider and tried to run up her wall when she felt a pop in the back of the right ankle. Immediate pain and she was unable to bear weight. She was seen at a local emergency room where x-rays were obtained and were negative per her report. She was seen earlier today by her primary care physician and there was concern about a possible Achilles rupture. She is here today for further evaluation. She denies any significant problems with her foot or ankle in the past. All of her pain is along the posterior aspect of the ankle. She has noticed some swelling diffusely in her foot. She is here today with her cousin.  Past medical history reviewed Medications reviewed Allergies reviewed    Review of Systems    as above Objective:   Physical Exam  Well-developed, well-nourished. No acute distress. Awake alert and oriented 3. Vital signs reviewed  Right ankle: There is a palpable defect in the mid substance of the Achilles tendon. Moderate soft tissue swelling and mild ecchymosis. There is a complete absence of plantar flexion with Thompson's testing. She has no strength with resisted plantar flexion. No tenderness over the medial lateral ankle. She is tender to palpation at the Achilles tendon tear. Good pulses. Unable to bear weight and walk without the assistance of crutches.  MSK ultrasound of the right Achilles tendon was performed.There is a complete mid substance tear seen both on long and short axis views.      Assessment & Plan:   Achilles tendon rupture, right ankle  Patient was referred to Dr. Ramond Marrowax Klein at [facility] to discuss surgical repair (appointment at 2:00 today). Further workup and  treatment will be per their discretion of Dr. Everardo Klein. Follow-up with me as needed.

## 2017-07-20 ENCOUNTER — Encounter: Payer: Self-pay | Admitting: Family Medicine

## 2017-07-20 ENCOUNTER — Other Ambulatory Visit: Payer: Self-pay | Admitting: Family Medicine

## 2017-07-20 MED ORDER — DICLOFENAC SODIUM 75 MG PO TBEC: 75.0000 mg | DELAYED_RELEASE_TABLET | Freq: Two times a day (BID) | ORAL | 0 refills | Status: DC | PRN

## 2017-08-11 ENCOUNTER — Other Ambulatory Visit: Payer: Self-pay | Admitting: Family Medicine

## 2017-08-14 ENCOUNTER — Encounter: Payer: Self-pay | Admitting: Family Medicine

## 2017-08-14 ENCOUNTER — Other Ambulatory Visit: Payer: Self-pay | Admitting: Family Medicine

## 2017-08-15 ENCOUNTER — Other Ambulatory Visit: Payer: Self-pay | Admitting: *Deleted

## 2017-08-15 ENCOUNTER — Encounter: Payer: Self-pay | Admitting: *Deleted

## 2017-08-15 DIAGNOSIS — E282 Polycystic ovarian syndrome: Secondary | ICD-10-CM

## 2017-08-15 MED ORDER — METFORMIN HCL ER 500 MG PO TB24: 1000.0000 mg | ORAL_TABLET | Freq: Every day | ORAL | 0 refills | Status: DC

## 2017-08-15 MED ORDER — DICLOFENAC SODIUM 75 MG PO TBEC: 75.0000 mg | DELAYED_RELEASE_TABLET | Freq: Two times a day (BID) | ORAL | 0 refills | Status: DC | PRN

## 2017-09-07 ENCOUNTER — Other Ambulatory Visit: Payer: Self-pay | Admitting: Family Medicine

## 2017-09-07 ENCOUNTER — Encounter: Payer: Self-pay | Admitting: Family Medicine

## 2017-09-07 MED ORDER — MELOXICAM 7.5 MG PO TABS: 7.5000 mg | ORAL_TABLET | Freq: Every day | ORAL | 0 refills | Status: DC

## 2017-09-11 ENCOUNTER — Other Ambulatory Visit: Payer: Self-pay | Admitting: Family Medicine

## 2017-09-11 DIAGNOSIS — E282 Polycystic ovarian syndrome: Secondary | ICD-10-CM

## 2017-09-14 ENCOUNTER — Encounter: Payer: Self-pay | Admitting: Family Medicine

## 2017-09-21 ENCOUNTER — Other Ambulatory Visit: Payer: Self-pay | Admitting: Family

## 2017-09-21 DIAGNOSIS — Z3041 Encounter for surveillance of contraceptive pills: Secondary | ICD-10-CM

## 2017-09-28 ENCOUNTER — Encounter: Payer: Self-pay | Admitting: Family Medicine

## 2017-10-16 ENCOUNTER — Other Ambulatory Visit: Payer: Self-pay | Admitting: Family Medicine

## 2017-10-16 DIAGNOSIS — E282 Polycystic ovarian syndrome: Secondary | ICD-10-CM

## 2017-11-09 ENCOUNTER — Encounter: Payer: Self-pay | Admitting: Family Medicine

## 2017-11-11 ENCOUNTER — Other Ambulatory Visit: Payer: Self-pay | Admitting: Family Medicine

## 2017-11-11 ENCOUNTER — Other Ambulatory Visit: Payer: Self-pay | Admitting: Physician Assistant

## 2017-11-11 DIAGNOSIS — E282 Polycystic ovarian syndrome: Secondary | ICD-10-CM

## 2017-11-11 DIAGNOSIS — W57XXXA Bitten or stung by nonvenomous insect and other nonvenomous arthropods, initial encounter: Secondary | ICD-10-CM

## 2017-11-12 NOTE — Telephone Encounter (Signed)
Last seen 07/17/17   

## 2017-11-20 ENCOUNTER — Other Ambulatory Visit: Payer: Self-pay | Admitting: Family Medicine

## 2017-11-20 ENCOUNTER — Encounter: Payer: Self-pay | Admitting: Family Medicine

## 2017-11-20 MED ORDER — ESCITALOPRAM OXALATE 20 MG PO TABS: 20.0000 mg | ORAL_TABLET | Freq: Every day | ORAL | 0 refills | Status: DC

## 2017-11-20 NOTE — Telephone Encounter (Signed)
Refill sent.  Please make sure she has a f/u appt.  She missed her last checkup

## 2017-11-21 MED ORDER — METFORMIN HCL ER 500 MG PO TB24: 1000.0000 mg | ORAL_TABLET | Freq: Every day | ORAL | 1 refills | Status: DC

## 2017-11-21 NOTE — Addendum Note (Signed)
Addended by: Julious Payer D on: 11/21/2017 08:31 AM   Modules accepted: Orders

## 2017-11-22 NOTE — Telephone Encounter (Signed)
Patient aware that refill has been sent to the pharmacy but will need to be seen for any further refills.

## 2017-12-19 ENCOUNTER — Other Ambulatory Visit: Payer: Self-pay

## 2017-12-19 ENCOUNTER — Other Ambulatory Visit: Payer: Self-pay | Admitting: Family Medicine

## 2017-12-19 DIAGNOSIS — Z3041 Encounter for surveillance of contraceptive pills: Secondary | ICD-10-CM

## 2017-12-21 ENCOUNTER — Other Ambulatory Visit: Payer: Self-pay | Admitting: Family Medicine

## 2017-12-21 DIAGNOSIS — Z3041 Encounter for surveillance of contraceptive pills: Secondary | ICD-10-CM

## 2017-12-21 MED ORDER — LEVONORGESTREL-ETHINYL ESTRAD 0.15-30 MG-MCG PO TABS: 1.0000 | ORAL_TABLET | Freq: Every day | ORAL | 3 refills | Status: DC

## 2017-12-21 MED ORDER — ESCITALOPRAM OXALATE 20 MG PO TABS: 20.0000 mg | ORAL_TABLET | Freq: Every day | ORAL | 0 refills | Status: DC

## 2017-12-21 MED ORDER — MELOXICAM 7.5 MG PO TABS: 7.5000 mg | ORAL_TABLET | Freq: Every day | ORAL | 0 refills | Status: DC | PRN

## 2017-12-21 NOTE — Telephone Encounter (Signed)
I have refilled her birth control, meloxicam and Lexapro.  The vitamin D prescription does not need to be refilled, in fact she needs to have a recheck of vitamin D to determine if her levels have come up with prescription vitamin D.  She should be on an over-the-counter vitamin D supplement of 400 international units daily. All controlled substances require a face-to-face visit.  She will need to be seen in office for the phentermine.

## 2018-02-18 ENCOUNTER — Encounter: Attending: Internal Medicine | Primary: Internal Medicine

## 2018-02-18 ENCOUNTER — Ambulatory Visit
Admit: 2018-02-18 | Discharge: 2018-02-18 | Payer: PRIVATE HEALTH INSURANCE | Attending: Internal Medicine | Primary: Internal Medicine

## 2018-02-18 ENCOUNTER — Ambulatory Visit: Attending: Internal Medicine | Primary: Internal Medicine

## 2018-02-18 DIAGNOSIS — S86011A Strain of right Achilles tendon, initial encounter: Secondary | ICD-10-CM

## 2018-02-18 LAB — AMB POC URINE PREGNANCY TEST, VISUAL COLOR COMPARISON
HCG urine, Ql. (POC): NEGATIVE
HCG, Pregnancy, Urine, POC: NEGATIVE

## 2018-02-18 MED ORDER — CETIRIZINE 10 MG TAB
10 mg | ORAL_TABLET | ORAL | 1 refills | Status: DC
Start: 2018-02-18 — End: 2019-05-13

## 2018-02-18 MED ORDER — LEVONORGESTREL-ETHINYL ESTRADIOL 0.15 MG-30 MCG TAB
PACK | Freq: Every day | ORAL | 3 refills | Status: DC
Start: 2018-02-18 — End: 2019-03-13

## 2018-02-18 MED ORDER — ESCITALOPRAM 20 MG TAB
20 mg | ORAL_TABLET | Freq: Every day | ORAL | 1 refills | Status: DC
Start: 2018-02-18 — End: 2018-09-25

## 2018-02-18 MED ORDER — METFORMIN SR 500 MG 24 HR TABLET
500 mg | ORAL_TABLET | ORAL | 1 refills | Status: DC
Start: 2018-02-18 — End: 2019-05-13

## 2018-02-18 NOTE — Progress Notes (Signed)
Subjective:      Lauren Rocha is a 37 y.o. female who presents today for   Chief Complaint   Patient presents with   ??? Establish Care     had a ruptured achiles, suregery was july 2019 and needs follow up.      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 She is a member of the [employer].    History ruptured achilles tendon (right). She underwent surgery and then started PT before she ,moved to [place]  Surgery was July 2019 and then went into PT but did not complete it.     Has hx pcos- takes metformin  She is on birth control pills usually obtains 90 day supply    Anxiety after divorce- has been on lexapro 20 mg daily    Obesity- was prescribed phentermine by her prior pcp. Was getting it every 3 months  Has been off of meds now since October.    Vit D def    Possible prediabetes        Needs refills on all of her medications    Patient Active Problem List    Diagnosis Date Noted   ??? Severe obesity (HCC) 02/18/2018     Current Outpatient Medications   Medication Sig Dispense Refill   ??? ergocalciferol (ERGOCALCIFEROL) 1,250 mcg (50,000 unit) capsule Take 50,000 Units by mouth.     ??? meloxicam (MOBIC) 7.5 mg tablet Take 7.5 mg by mouth.     ??? escitalopram oxalate (LEXAPRO) 20 mg tablet Take 20 mg by mouth.     ??? levonorgestrel-ethinyl estradiol (KURVELO, 28,) 0.15-0.03 mg tab Take 1 Tab by mouth.     ??? metFORMIN ER (GLUCOPHAGE XR) 500 mg tablet Take 1,000 mg by mouth.     ??? cetirizine (ZYRTEC) 10 mg tablet TAKE 1 TABLET BY MOUTH EVERY DAY     ??? phentermine (ADIPEX-P) 37.5 mg tablet Take 18.75-37.5 mg by mouth.     ??? LORazepam (ATIVAN) 0.5 mg tablet Take 0.5 mg by mouth.       Allergies   Allergen Reactions   ??? Strawberry Anaphylaxis   ??? Augmentin [Amoxicillin-Pot Clavulanate] Other (comments)     Rectal bleeding       Past Medical History:   Diagnosis Date   ??? Anxiety    ??? PCOS (polycystic ovarian syndrome)    ??? Sciatica      Past Surgical History:    Procedure Laterality Date   ??? BREAST SURGERY PROCEDURE UNLISTED      r breast cyst removal   ??? HX ORTHOPAEDIC       Family History   Problem Relation Age of Onset   ??? Cancer Maternal Aunt         breast   ??? Diabetes Maternal Grandmother    ??? Cancer Maternal Grandmother         breast     Social History     Tobacco Use   ??? Smoking status: Never Smoker   ??? Smokeless tobacco: Never Used   Substance Use Topics   ??? Alcohol use: Yes        Review of Systems    A comprehensive review of systems was negative except for that written in the HPI.     Objective:     Visit Vitals  Resp 16   Ht 5' 2" (1.575 m)   Wt 208 lb (94.3 kg)     BMI 38.04 kg/m??     General:  Alert, cooperative, no distress, appears stated age.   Head:  Normocephalic, without obvious abnormality, atraumatic.   Eyes:  Conjunctivae/corneas clear. PERRL, EOMs intact. Fundi benign.   Ears:  Normal TMs and external ear canals both ears.   Nose: Nares normal. Septum midline. Mucosa normal. No drainage or sinus tenderness.   Throat: Lips, mucosa, and tongue normal. Teeth and gums normal.   Neck: Supple, symmetrical, trachea midline, no adenopathy, thyroid: no enlargement/tenderness/nodules, no carotid bruit and no JVD.   Back:   Symmetric, no curvature. ROM normal. No CVA tenderness.   Lungs:   Clear to auscultation bilaterally.   Chest wall:  No tenderness or deformity.   Heart:  Regular rate and rhythm, S1, S2 normal, no murmur, click, rub or gallop.   Abdomen:   Soft, non-tender. Bowel sounds normal. No masses,  No organomegaly.   Extremities: Healed scar right achilles tendon   Pulses: 2+ and symmetric all extremities.   Skin: Skin color, texture, turgor normal. No rashes or lesions.   Lymph nodes: Cervical, supraclavicular, and axillary nodes normal.   Neurologic: CNII-XII intact. Normal strength, sensation and reflexes throughout.       Assessment/Plan:       ICD-10-CM ICD-9-CM    1. Traumatic rupture of right Achilles tendon, initial encounter S86.011A  845.09 REFERRAL TO ORTHOPEDICS      CANCELED: REFERRAL TO ORTHOPEDIC SURGERY   2. Severe obesity (HCC) E66.01 278.01 REFERRAL TO WEIGHT LOSS   3. Allergic state, initial encounter T78.40XA 995.3 cetirizine (ZYRTEC) 10 mg tablet   4. Anxiety F41.9 300.00 escitalopram oxalate (LEXAPRO) 20 mg tablet   5. PCOS (polycystic ovarian syndrome) E28.2 256.4 metFORMIN ER (GLUCOPHAGE XR) 500 mg tablet   6. Vitamin D deficiency E55.9 268.9    7. Encounter for contraceptive management, unspecified type Z30.9 V25.9 levonorgestrel-ethinyl estradiol (KURVELO, 28,) 0.15-0.03 mg tab      AMB POC URINE PREGNANCY TEST, VISUAL COLOR COMPARISON   8. Keloid L91.0 701.4 REFERRAL TO DERMATOLOGY       Schedule complete physical           Advised her to call back or return to office if symptoms worsen/change/persist.  Discussed expected course/resolution/complications of diagnosis in detail with patient.    Medication risks/benefits/costs/interactions/alternatives discussed with patient.  She was given an after visit summary which includes diagnoses, current medications, & vitals.  She expressed understanding with the diagnosis and plan.

## 2018-02-18 NOTE — Progress Notes (Signed)
Subjective:      Lauren Rocha is a 38 y.o. female who presents today for   Chief Complaint   Patient presents with   ??? Establish Care     had a ruptured achiles, suregery was july 2019 and needs follow up.      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 She is a member of the [employer].    History ruptured achilles tendon (right). She underwent surgery and then started PT before she ,moved to [facility]  Surgery was July 2019 and then went into PT but did not complete it.     Has hx pcos- takes metformin  She is on birth control pills usually obtains 90 day supply    Anxiety after divorce- has been on lexapro 20 mg daily    Obesity- was prescribed phentermine by her prior pcp. Was getting it every 3 months  Has been off of meds now since October.    Vit D def    Possible prediabetes        Needs refills on all of her medications    Patient Active Problem List    Diagnosis Date Noted   ??? Severe obesity (HCC) 02/18/2018     Current Outpatient Medications   Medication Sig Dispense Refill   ??? ergocalciferol (ERGOCALCIFEROL) 1,250 mcg (50,000 unit) capsule Take 50,000 Units by mouth.     ??? meloxicam (MOBIC) 7.5 mg tablet Take 7.5 mg by mouth.     ??? escitalopram oxalate (LEXAPRO) 20 mg tablet Take 20 mg by mouth.     ??? levonorgestrel-ethinyl estradiol (KURVELO, 28,) 0.15-0.03 mg tab Take 1 Tab by mouth.     ??? metFORMIN ER (GLUCOPHAGE XR) 500 mg tablet Take 1,000 mg by mouth.     ??? cetirizine (ZYRTEC) 10 mg tablet TAKE 1 TABLET BY MOUTH EVERY DAY     ??? phentermine (ADIPEX-P) 37.5 mg tablet Take 18.75-37.5 mg by mouth.     ??? LORazepam (ATIVAN) 0.5 mg tablet Take 0.5 mg by mouth.       Allergies   Allergen Reactions   ??? Strawberry Anaphylaxis   ??? Augmentin [Amoxicillin-Pot Clavulanate] Other (comments)     Rectal bleeding       Past Medical History:   Diagnosis Date   ??? Anxiety    ??? PCOS (polycystic ovarian syndrome)    ??? Sciatica      Past Surgical History:    Procedure Laterality Date   ??? BREAST SURGERY PROCEDURE UNLISTED      r breast cyst removal   ??? HX ORTHOPAEDIC       Family History   Problem Relation Age of Onset   ??? Cancer Maternal Aunt         breast   ??? Diabetes Maternal Grandmother    ??? Cancer Maternal Grandmother         breast     Social History     Tobacco Use   ??? Smoking status: Never Smoker   ??? Smokeless tobacco: Never Used   Substance Use Topics   ??? Alcohol use: Yes        Review of Systems    A comprehensive review of systems was negative except for that written in the HPI.     Objective:     Visit Vitals  Resp 16   Ht 5\' 2"  (1.575 m)   Wt 208 lb (94.3 kg)  BMI 38.04 kg/m??     General:  Alert, cooperative, no distress, appears stated age.   Head:  Normocephalic, without obvious abnormality, atraumatic.   Eyes:  Conjunctivae/corneas clear. PERRL, EOMs intact. Fundi benign.   Ears:  Normal TMs and external ear canals both ears.   Nose: Nares normal. Septum midline. Mucosa normal. No drainage or sinus tenderness.   Throat: Lips, mucosa, and tongue normal. Teeth and gums normal.   Neck: Supple, symmetrical, trachea midline, no adenopathy, thyroid: no enlargement/tenderness/nodules, no carotid bruit and no JVD.   Back:   Symmetric, no curvature. ROM normal. No CVA tenderness.   Lungs:   Clear to auscultation bilaterally.   Chest wall:  No tenderness or deformity.   Heart:  Regular rate and rhythm, S1, S2 normal, no murmur, click, rub or gallop.   Abdomen:   Soft, non-tender. Bowel sounds normal. No masses,  No organomegaly.   Extremities: Healed scar right achilles tendon   Pulses: 2+ and symmetric all extremities.   Skin: Skin color, texture, turgor normal. No rashes or lesions.   Lymph nodes: Cervical, supraclavicular, and axillary nodes normal.   Neurologic: CNII-XII intact. Normal strength, sensation and reflexes throughout.       Assessment/Plan:       ICD-10-CM ICD-9-CM    1. Traumatic rupture of right Achilles tendon, initial encounter S86.011A  845.09 REFERRAL TO ORTHOPEDICS      CANCELED: REFERRAL TO ORTHOPEDIC SURGERY   2. Severe obesity (HCC) E66.01 278.01 REFERRAL TO WEIGHT LOSS   3. Allergic state, initial encounter T78.40XA 995.3 cetirizine (ZYRTEC) 10 mg tablet   4. Anxiety F41.9 300.00 escitalopram oxalate (LEXAPRO) 20 mg tablet   5. PCOS (polycystic ovarian syndrome) E28.2 256.4 metFORMIN ER (GLUCOPHAGE XR) 500 mg tablet   6. Vitamin D deficiency E55.9 268.9    7. Encounter for contraceptive management, unspecified type Z30.9 V25.9 levonorgestrel-ethinyl estradiol (KURVELO, 28,) 0.15-0.03 mg tab      AMB POC URINE PREGNANCY TEST, VISUAL COLOR COMPARISON   8. Keloid L91.0 701.4 REFERRAL TO DERMATOLOGY       Schedule complete physical           Advised her to call back or return to office if symptoms worsen/change/persist.  Discussed expected course/resolution/complications of diagnosis in detail with patient.    Medication risks/benefits/costs/interactions/alternatives discussed with patient.  She was given an after visit summary which includes diagnoses, current medications, & vitals.  She expressed understanding with the diagnosis and plan.

## 2018-03-22 ENCOUNTER — Ambulatory Visit
Admit: 2018-03-22 | Discharge: 2018-03-22 | Payer: PRIVATE HEALTH INSURANCE | Attending: Internal Medicine | Primary: Internal Medicine

## 2018-03-22 ENCOUNTER — Ambulatory Visit: Attending: Internal Medicine | Primary: Internal Medicine

## 2018-03-22 DIAGNOSIS — Z0000 Encounter for general adult medical examination without abnormal findings: Secondary | ICD-10-CM

## 2018-03-22 LAB — AMB POC URINALYSIS DIP STICK AUTO W/O MICRO
Bilirubin (UA POC): NEGATIVE
Bilirubin, Urine, POC: NEGATIVE
Glucose (UA POC): NEGATIVE
Glucose, Urine, POC: NEGATIVE
Ketones (UA POC): NEGATIVE
Ketones, Urine, POC: NEGATIVE
Leukocyte Esterase, Urine, POC: NEGATIVE
Leukocyte esterase (UA POC): NEGATIVE
Nitrite, Urine, POC: NEGATIVE
Nitrites (UA POC): NEGATIVE
Protein (UA POC): NEGATIVE
Protein, Urine, POC: NEGATIVE
Specific Gravity, Urine, POC: 1.03 NA (ref 1.001–1.035)
Specific gravity (UA POC): 1.03 (ref 1.001–1.035)
Urobilinogen (UA POC): 0.2 (ref 0.2–1)
Urobilinogen, POC: 0.2 (ref 0.2–1)
pH (UA POC): 5.5 (ref 4.6–8.0)
pH, Urine, POC: 5.5 NA (ref 4.6–8.0)

## 2018-03-22 LAB — AMB POC URINE PREGNANCY TEST, VISUAL COLOR COMPARISON
HCG urine, Ql. (POC): NEGATIVE
HCG, Pregnancy, Urine, POC: NEGATIVE

## 2018-03-22 NOTE — Progress Notes (Signed)
Chief Complaint   Patient presents with    Physical     1. Have you been to the ER, urgent care clinic since your last visit?  Hospitalized since your last visit?No    2. Have you seen or consulted any other health care providers outside of the [facility] since your last visit?  Include any pap smears or colon screening. No

## 2018-03-22 NOTE — Progress Notes (Signed)
Chief Complaint   Patient presents with   ??? Physical     1. Have you been to the ER, urgent care clinic since your last visit?  Hospitalized since your last visit?No    2. Have you seen or consulted any other health care providers outside of the [facility] since your last visit?  Include any pap smears or colon screening. No

## 2018-03-22 NOTE — Progress Notes (Signed)
Subjective:      Lauren Rocha is a 38 y.o. female who presents today for   Chief Complaint   Patient presents with   ??? Physical   patient is concerned she may have ADD. Difficulty multitasking, focus, concentration, effective completion of goals    Pap  x2 yrs ago - it was normal  She has a hx of normal pap smears  She is on her menstrual cycle today    mammo  Hx benign rt breast cyst removal  Maternal grandmother and 4 maternal great aunts have breast cancer    Supplements  Advised to take MVI but patient had allergic rxn in the past    Immunizations-  Advised flu vaccine  TDAP today    Diet/exercise  Regular diet  Does not exercise but she would like to implement a plan  She would like to see ortho first for clearance and  to be eval for chronic ankle injury    ophthal    dentist      Patient Active Problem List    Diagnosis Date Noted   ??? Severe obesity (HCC) 02/18/2018     Current Outpatient Medications   Medication Sig Dispense Refill   ??? metFORMIN ER (GLUCOPHAGE XR) 500 mg tablet Take 1,000 mg by mouth.     ??? LORazepam (ATIVAN) 0.5 mg tablet Take 0.5 mg by mouth.     ??? metFORMIN ER (GLUCOPHAGE XR) 500 mg tablet Take 2 tabs po daily 180 Tab 1   ??? escitalopram oxalate (LEXAPRO) 20 mg tablet Take 1 Tab by mouth daily. 90 Tab 1   ??? levonorgestrel-ethinyl estradiol (KURVELO, 28,) 0.15-0.03 mg tab Take 1 Tab by mouth daily. 90 Package 3   ??? cetirizine (ZYRTEC) 10 mg tablet TAKE 1 TABLET BY MOUTH EVERY DAY 90 Tab 1   ??? ergocalciferol (ERGOCALCIFEROL) 1,250 mcg (50,000 unit) capsule Take 50,000 Units by mouth.     ??? meloxicam (MOBIC) 7.5 mg tablet Take 7.5 mg by mouth.     ??? phentermine (ADIPEX-P) 37.5 mg tablet Take 18.75-37.5 mg by mouth.       Allergies   Allergen Reactions   ??? Strawberry Anaphylaxis   ??? Augmentin [Amoxicillin-Pot Clavulanate] Other (comments)     Rectal bleeding       Past Medical History:   Diagnosis Date   ??? Anxiety    ??? PCOS (polycystic ovarian syndrome)    ??? Sciatica      Past Surgical  History:   Procedure Laterality Date   ??? BREAST SURGERY PROCEDURE UNLISTED      r breast cyst removal   ??? HX ORTHOPAEDIC       Family History   Problem Relation Age of Onset   ??? Cancer Maternal Aunt         breast   ??? Diabetes Maternal Grandmother    ??? Cancer Maternal Grandmother         breast     Social History     Tobacco Use   ??? Smoking status: Never Smoker   ??? Smokeless tobacco: Never Used   Substance Use Topics   ??? Alcohol use: Yes        Review of Systems    A comprehensive review of systems was negative except for that written in the HPI.     Objective:     Visit Vitals  BP 120/77   Pulse 79   Temp 98.5 ??F (36.9 ??C) (Oral)   Resp 16  Ht 5\' 2"  (1.575 m)   Wt 209 lb (94.8 kg)   LMP 03/21/2018 (Exact Date)   BMI 38.23 kg/m??     General:  Alert, cooperative, no distress, appears stated age.   Head:  Normocephalic, without obvious abnormality, atraumatic.   Eyes:  Conjunctivae/corneas clear. PERRL, EOMs intact. Fundi benign.   Ears:  Normal TMs and external ear canals both ears.   Nose: Nares normal. Septum midline. Mucosa normal. No drainage or sinus tenderness.   Throat: Lips, mucosa, and tongue normal. Teeth and gums normal.   Neck: Supple, symmetrical, trachea midline, no adenopathy, thyroid: no enlargement/tenderness/nodules, no carotid bruit and no JVD.   Back:   Symmetric, no curvature. ROM normal. No CVA tenderness.   Lungs:   Clear to auscultation bilaterally.   Chest wall:  No tenderness or deformity.   Heart:  Regular rate and rhythm, S1, S2 normal, no murmur, click, rub or gallop.   Abdomen:   Soft, non-tender. Bowel sounds normal. No masses,  No organomegaly.   Extremities: Extremities normal, atraumatic, no cyanosis or edema.   Pulses: 2+ and symmetric all extremities.   Skin: Skin color, texture, turgor normal. No rashes or lesions.   Lymph nodes: Cervical, supraclavicular, and axillary nodes normal.   Neurologic: CNII-XII intact. Normal strength, sensation and reflexes throughout.        Assessment/Plan:       ICD-10-CM ICD-9-CM    1. Physical exam Z00.00 V70.9 MAM MAMMO BI SCREENING INCL CAD      REFERRAL TO OPHTHALMOLOGY      AMB POC URINE PREGNANCY TEST, VISUAL COLOR COMPARISON      AMB POC URINALYSIS DIP STICK AUTO W/O MICRO      METABOLIC PANEL, COMPREHENSIVE      LIPID PANEL      HEMOGLOBIN A1C W/O EAG      CBC WITH AUTOMATED DIFF      VITAMIN D, 25 HYDROXY   2. Family history of breast cancer in female Z25.3 V57.3 MAM MAMMO BI SCREENING INCL CAD   3. PCOS (polycystic ovarian syndrome) E28.2 256.4 HEMOGLOBIN A1C W/O EAG   4. Vitamin D deficiency E55.9 268.9 VITAMIN D, 25 HYDROXY   5. Concentration deficit R41.840 799.51 REFERRAL TO NEUROPSYCHOLOGY          Advised her to call back or return to office if symptoms worsen/change/persist.  Discussed expected course/resolution/complications of diagnosis in detail with patient.    Medication risks/benefits/costs/interactions/alternatives discussed with patient.  She was given an after visit summary which includes diagnoses, current medications, & vitals.  She expressed understanding with the diagnosis and plan.

## 2018-03-22 NOTE — Progress Notes (Signed)
Blood in urine. Patient was on menstrual cycle  Normal kidney, liver function and electrolytes  Diabetes test normal  Blood counts are normal    Vit D too low- send in Vit D3 50,000 international units 1 tab po q week for 8 weeks    Cholesterol is highly elevated. Please schedule a follow up appointment to discuss this

## 2018-03-22 NOTE — Progress Notes (Signed)
Subjective:      Lauren Rocha is a 38 y.o. female who presents today for   Chief Complaint   Patient presents with   ??? Physical   patient is concerned she may have ADD. Difficulty multitasking, focus, concentration, effective completion of goals    Pap  x2 yrs ago - it was normal  She has a hx of normal pap smears  She is on her menstrual cycle today    mammo  Hx benign rt breast cyst removal  Maternal grandmother and 4 maternal great aunts have breast cancer    Supplements  Advised to take MVI but patient had allergic rxn in the past    Immunizations-  Advised flu vaccine  TDAP today    Diet/exercise  Regular diet  Does not exercise but she would like to implement a plan  She would like to see ortho first for clearance and  to be eval for chronic ankle injury    ophthal    dentist      Patient Active Problem List    Diagnosis Date Noted   ??? Severe obesity (HCC) 02/18/2018     Current Outpatient Medications   Medication Sig Dispense Refill   ??? metFORMIN ER (GLUCOPHAGE XR) 500 mg tablet Take 1,000 mg by mouth.     ??? LORazepam (ATIVAN) 0.5 mg tablet Take 0.5 mg by mouth.     ??? metFORMIN ER (GLUCOPHAGE XR) 500 mg tablet Take 2 tabs po daily 180 Tab 1   ??? escitalopram oxalate (LEXAPRO) 20 mg tablet Take 1 Tab by mouth daily. 90 Tab 1   ??? levonorgestrel-ethinyl estradiol (KURVELO, 28,) 0.15-0.03 mg tab Take 1 Tab by mouth daily. 90 Package 3   ??? cetirizine (ZYRTEC) 10 mg tablet TAKE 1 TABLET BY MOUTH EVERY DAY 90 Tab 1   ??? ergocalciferol (ERGOCALCIFEROL) 1,250 mcg (50,000 unit) capsule Take 50,000 Units by mouth.     ??? meloxicam (MOBIC) 7.5 mg tablet Take 7.5 mg by mouth.     ??? phentermine (ADIPEX-P) 37.5 mg tablet Take 18.75-37.5 mg by mouth.       Allergies   Allergen Reactions   ??? Strawberry Anaphylaxis   ??? Augmentin [Amoxicillin-Pot Clavulanate] Other (comments)     Rectal bleeding       Past Medical History:   Diagnosis Date   ??? Anxiety    ??? PCOS (polycystic ovarian syndrome)    ??? Sciatica       Past Surgical History:   Procedure Laterality Date   ??? BREAST SURGERY PROCEDURE UNLISTED      r breast cyst removal   ??? HX ORTHOPAEDIC       Family History   Problem Relation Age of Onset   ??? Cancer Maternal Aunt         breast   ??? Diabetes Maternal Grandmother    ??? Cancer Maternal Grandmother         breast     Social History     Tobacco Use   ??? Smoking status: Never Smoker   ??? Smokeless tobacco: Never Used   Substance Use Topics   ??? Alcohol use: Yes        Review of Systems    A comprehensive review of systems was negative except for that written in the HPI.     Objective:     Visit Vitals  BP 120/77   Pulse 79   Temp 98.5 ??F (36.9 ??C) (Oral)   Resp 16  Ht 5\' 2"  (1.575 m)   Wt 209 lb (94.8 kg)   LMP 03/21/2018 (Exact Date)   BMI 38.23 kg/m??     General:  Alert, cooperative, no distress, appears stated age.   Head:  Normocephalic, without obvious abnormality, atraumatic.   Eyes:  Conjunctivae/corneas clear. PERRL, EOMs intact. Fundi benign.   Ears:  Normal TMs and external ear canals both ears.   Nose: Nares normal. Septum midline. Mucosa normal. No drainage or sinus tenderness.   Throat: Lips, mucosa, and tongue normal. Teeth and gums normal.   Neck: Supple, symmetrical, trachea midline, no adenopathy, thyroid: no enlargement/tenderness/nodules, no carotid bruit and no JVD.   Back:   Symmetric, no curvature. ROM normal. No CVA tenderness.   Lungs:   Clear to auscultation bilaterally.   Chest wall:  No tenderness or deformity.   Heart:  Regular rate and rhythm, S1, S2 normal, no murmur, click, rub or gallop.   Abdomen:   Soft, non-tender. Bowel sounds normal. No masses,  No organomegaly.   Extremities: Extremities normal, atraumatic, no cyanosis or edema.   Pulses: 2+ and symmetric all extremities.   Skin: Skin color, texture, turgor normal. No rashes or lesions.   Lymph nodes: Cervical, supraclavicular, and axillary nodes normal.   Neurologic: CNII-XII intact. Normal strength, sensation and reflexes throughout.        Assessment/Plan:       ICD-10-CM ICD-9-CM    1. Physical exam Z00.00 V70.9 MAM MAMMO BI SCREENING INCL CAD      REFERRAL TO OPHTHALMOLOGY      AMB POC URINE PREGNANCY TEST, VISUAL COLOR COMPARISON      AMB POC URINALYSIS DIP STICK AUTO W/O MICRO      METABOLIC PANEL, COMPREHENSIVE      LIPID PANEL      HEMOGLOBIN A1C W/O EAG      CBC WITH AUTOMATED DIFF      VITAMIN D, 25 HYDROXY   2. Family history of breast cancer in female Z25.3 V57.3 MAM MAMMO BI SCREENING INCL CAD   3. PCOS (polycystic ovarian syndrome) E28.2 256.4 HEMOGLOBIN A1C W/O EAG   4. Vitamin D deficiency E55.9 268.9 VITAMIN D, 25 HYDROXY   5. Concentration deficit R41.840 799.51 REFERRAL TO NEUROPSYCHOLOGY          Advised her to call back or return to office if symptoms worsen/change/persist.  Discussed expected course/resolution/complications of diagnosis in detail with patient.    Medication risks/benefits/costs/interactions/alternatives discussed with patient.  She was given an after visit summary which includes diagnoses, current medications, & vitals.  She expressed understanding with the diagnosis and plan.

## 2018-03-22 NOTE — Progress Notes (Signed)
Blood in urine. Patient was on menstrual cycle  Normal kidney, liver function and electrolytes  Diabetes test normal  Blood counts are normal    Vit D too low- send in Vit D3 50,000 international units 1 tab po q week for 8 weeks    Cholesterol is highly elevated. Please schedule a follow up appointment to discuss this

## 2018-03-23 LAB — CBC WITH AUTOMATED DIFF
ABS. BASOPHILS: 0 10*3/uL (ref 0.0–0.2)
ABS. EOSINOPHILS: 0.1 10*3/uL (ref 0.0–0.4)
ABS. IMM. GRANS.: 0 10*3/uL (ref 0.0–0.1)
ABS. MONOCYTES: 0.3 10*3/uL (ref 0.1–0.9)
ABS. NEUTROPHILS: 2 10*3/uL (ref 1.4–7.0)
Abs Lymphocytes: 2.6 10*3/uL (ref 0.7–3.1)
BASOPHILS: 1 %
EOSINOPHILS: 1 %
HCT: 44.3 % (ref 34.0–46.6)
HGB: 14.5 g/dL (ref 11.1–15.9)
IMMATURE GRANULOCYTES: 0 %
Lymphocytes: 52 %
MCH: 29.2 pg (ref 26.6–33.0)
MCHC: 32.7 g/dL (ref 31.5–35.7)
MCV: 89 fL (ref 79–97)
MONOCYTES: 6 %
NEUTROPHILS: 40 %
PLATELET: 403 10*3/uL (ref 150–450)
RBC: 4.97 x10E6/uL (ref 3.77–5.28)
RDW: 12.4 % (ref 11.7–15.4)
WBC: 4.9 10*3/uL (ref 3.4–10.8)

## 2018-03-23 LAB — HEMOGLOBIN A1C W/O EAG
Hemoglobin A1C: 4.8 % (ref 4.8–5.6)
Hemoglobin A1c: 4.8 % (ref 4.8–5.6)

## 2018-03-23 LAB — LIPID PANEL
Cholesterol, Total: 243 mg/dL — ABNORMAL HIGH (ref 100–199)
Cholesterol, total: 243 mg/dL — ABNORMAL HIGH (ref 100–199)
HDL Cholesterol: 44 mg/dL (ref >39)
HDL: 44 mg/dL (ref >39)
LDL Calculated: 187 mg/dL — ABNORMAL HIGH (ref 0–99)
LDL, calculated: 187 mg/dL — ABNORMAL HIGH (ref 0–99)
Triglyceride: 60 mg/dL (ref 0–149)
Triglycerides: 60 mg/dL (ref 0–149)
VLDL Cholesterol Calculated: 12 mg/dL (ref 5–40)
VLDL, calculated: 12 mg/dL (ref 5–40)

## 2018-03-23 LAB — METABOLIC PANEL, COMPREHENSIVE
A-G Ratio: 1.5 (ref 1.2–2.2)
ALT (SGPT): 10 IU/L (ref 0–32)
AST (SGOT): 10 IU/L (ref 0–40)
Albumin: 4.3 g/dL (ref 3.8–4.8)
Alk. phosphatase: 53 IU/L (ref 39–117)
BUN/Creatinine ratio: 14 (ref 9–23)
BUN: 11 mg/dL (ref 6–20)
Bilirubin, total: 0.3 mg/dL (ref 0.0–1.2)
CO2: 20 mmol/L (ref 20–29)
Calcium: 9.1 mg/dL (ref 8.7–10.2)
Chloride: 103 mmol/L (ref 96–106)
Creatinine: 0.76 mg/dL (ref 0.57–1.00)
GFR est AA: 117 mL/min/{1.73_m2} (ref >59)
GFR est non-AA: 101 mL/min/{1.73_m2} (ref >59)
GLOBULIN, TOTAL: 2.9 g/dL (ref 1.5–4.5)
Glucose: 61 mg/dL — ABNORMAL LOW (ref 65–99)
Potassium: 4.2 mmol/L (ref 3.5–5.2)
Protein, total: 7.2 g/dL (ref 6.0–8.5)
Sodium: 139 mmol/L (ref 134–144)

## 2018-03-23 LAB — VITAMIN D, 25 HYDROXY: VITAMIN D, 25-HYDROXY: 15.3 ng/mL — ABNORMAL LOW (ref 30.0–100.0)

## 2018-03-23 LAB — CBC WITH AUTO DIFFERENTIAL
Basophils %: 1 %
Basophils Absolute: 0 10*3/uL (ref 0.0–0.2)
Eosinophils %: 1 %
Eosinophils Absolute: 0.1 10*3/uL (ref 0.0–0.4)
Granulocyte Absolute Count: 0 10*3/uL (ref 0.0–0.1)
Hematocrit: 44.3 % (ref 34.0–46.6)
Hemoglobin: 14.5 g/dL (ref 11.1–15.9)
Immature Granulocytes: 0 %
Lymphocytes %: 52 %
Lymphocytes Absolute: 2.6 10*3/uL (ref 0.7–3.1)
MCH: 29.2 pg (ref 26.6–33.0)
MCHC: 32.7 g/dL (ref 31.5–35.7)
MCV: 89 fL (ref 79–97)
Monocytes %: 6 %
Monocytes Absolute: 0.3 10*3/uL (ref 0.1–0.9)
Neutrophils %: 40 %
Neutrophils Absolute: 2 10*3/uL (ref 1.4–7.0)
Platelets: 403 10*3/uL (ref 150–450)
RBC: 4.97 x10E6/uL (ref 3.77–5.28)
RDW: 12.4 % (ref 11.7–15.4)
WBC: 4.9 10*3/uL (ref 3.4–10.8)

## 2018-03-23 LAB — COMPREHENSIVE METABOLIC PANEL
ALT: 10 IU/L (ref 0–32)
AST: 10 IU/L (ref 0–40)
Albumin/Globulin Ratio: 1.5 NA (ref 1.2–2.2)
Albumin: 4.3 g/dL (ref 3.8–4.8)
Alkaline Phosphatase: 53 IU/L (ref 39–117)
BUN: 11 mg/dL (ref 6–20)
Bun/Cre Ratio: 14 NA (ref 9–23)
CO2: 20 mmol/L (ref 20–29)
Calcium: 9.1 mg/dL (ref 8.7–10.2)
Chloride: 103 mmol/L (ref 96–106)
Creatinine: 0.76 mg/dL (ref 0.57–1.00)
EGFR IF NonAfrican American: 101 mL/min/{1.73_m2} (ref >59)
GFR African American: 117 mL/min/{1.73_m2} (ref >59)
Globulin, Total: 2.9 g/dL (ref 1.5–4.5)
Glucose: 61 mg/dL — ABNORMAL LOW (ref 65–99)
Potassium: 4.2 mmol/L (ref 3.5–5.2)
Sodium: 139 mmol/L (ref 134–144)
Total Bilirubin: 0.3 mg/dL (ref 0.0–1.2)
Total Protein: 7.2 g/dL (ref 6.0–8.5)

## 2018-03-23 LAB — VITAMIN D 25 HYDROXY: Vit D, 25-Hydroxy: 15.3 ng/mL — ABNORMAL LOW (ref 30.0–100.0)

## 2018-04-02 MED ORDER — CHOLECALCIFEROL (VITAMIN D3) 50,000 UNIT CAPSULE
ORAL_CAPSULE | ORAL | 0 refills | Status: DC
Start: 2018-04-02 — End: 2019-05-13

## 2018-04-02 NOTE — Telephone Encounter (Signed)
Called and reviewed labs with pt who has voiced an understanding. Pt will follow up as discussed. Ceasar Lund, LPN

## 2018-04-02 NOTE — Telephone Encounter (Signed)
-----   Message from Deno Etienne, MD sent at 03/28/2018 12:32 AM EST -----  Blood in urine. Patient was on menstrual cycle  Normal kidney, liver function and electrolytes  Diabetes test normal  Blood counts are normal    Vit D too low- send in Vit D3 50,000 international units 1 tab po q week for 8 weeks    Cholesterol is highly elevated. Please schedule a follow up appointment to discuss this

## 2018-04-02 NOTE — Telephone Encounter (Signed)
V.O.R.B given by dr Emilee Hero to place an order for Vitamin D3 50,000IU take one tab every 7 days for 8 weeks with no refills.

## 2018-04-15 ENCOUNTER — Encounter: Attending: Internal Medicine | Primary: Internal Medicine

## 2018-04-29 ENCOUNTER — Encounter: Attending: Internal Medicine | Primary: Internal Medicine

## 2018-05-10 ENCOUNTER — Ambulatory Visit: Payer: PRIVATE HEALTH INSURANCE | Primary: Internal Medicine

## 2018-05-15 ENCOUNTER — Telehealth: Payer: Managed Care, Other (non HMO) | Admitting: Family

## 2018-05-15 DIAGNOSIS — J209 Acute bronchitis, unspecified: Secondary | ICD-10-CM | POA: Diagnosis not present

## 2018-05-15 MED ORDER — PREDNISONE 10 MG (21) PO TBPK: ORAL_TABLET | ORAL | 0 refills | Status: DC

## 2018-05-15 MED ORDER — ALBUTEROL SULFATE HFA 108 (90 BASE) MCG/ACT IN AERS: 2.0000 | INHALATION_SPRAY | Freq: Four times a day (QID) | RESPIRATORY_TRACT | 0 refills | Status: AC | PRN

## 2018-05-15 MED ORDER — BENZONATATE 100 MG PO CAPS: 100.0000 mg | ORAL_CAPSULE | Freq: Three times a day (TID) | ORAL | 0 refills | Status: DC | PRN

## 2018-05-15 NOTE — Progress Notes (Signed)
We are sorry that you are not feeling well.  Here is how we plan to help!  Based on your presentation I believe you most likely have A cough due to a virus.  This is called viral bronchitis and is best treated by rest, plenty of fluids and control of the cough.  You may use Ibuprofen or Tylenol as directed to help your symptoms.     In addition you may use A non-prescription cough medication called Robitussin DAC. Take 2 teaspoons every 8 hours or Delsym: take 2 teaspoons every 12 hours., A non-prescription cough medication called Mucinex DM: take 2 tablets every 12 hours. and A prescription cough medication called Tessalon Perles 100mg. You may take 1-2 capsules every 8 hours as needed for your cough.  Prednisone 10 mg daily for 6 days (see taper instructions below)  Directions for 6 day taper: Day 1: 2 tablets before breakfast, 1 after both lunch & dinner and 2 at bedtime Day 2: 1 tab before breakfast, 1 after both lunch & dinner and 2 at bedtime Day 3: 1 tab at each meal & 1 at bedtime Day 4: 1 tab at breakfast, 1 at lunch, 1 at bedtime Day 5: 1 tab at breakfast & 1 tab at bedtime Day 6: 1 tab at breakfast   From your responses in the eVisit questionnaire you describe inflammation in the upper respiratory tract which is causing a significant cough.  This is commonly called Bronchitis and has four common causes:    Allergies  Viral Infections  Acid Reflux  Bacterial Infection Allergies, viruses and acid reflux are treated by controlling symptoms or eliminating the cause. An example might be a cough caused by taking certain blood pressure medications. You stop the cough by changing the medication. Another example might be a cough caused by acid reflux. Controlling the reflux helps control the cough.  USE OF BRONCHODILATOR ("RESCUE") INHALERS: There is a risk from using your bronchodilator too frequently.  The risk is that over-reliance on a medication which only relaxes the muscles  surrounding the breathing tubes can reduce the effectiveness of medications prescribed to reduce swelling and congestion of the tubes themselves.  Although you feel brief relief from the bronchodilator inhaler, your asthma may actually be worsening with the tubes becoming more swollen and filled with mucus.  This can delay other crucial treatments, such as oral steroid medications. If you need to use a bronchodilator inhaler daily, several times per day, you should discuss this with your provider.  There are probably better treatments that could be used to keep your asthma under control.     HOME CARE . Only take medications as instructed by your medical team. . Complete the entire course of an antibiotic. . Drink plenty of fluids and get plenty of rest. . Avoid close contacts especially the very young and the elderly . Cover your mouth if you cough or cough into your sleeve. . Always remember to wash your hands . A steam or ultrasonic humidifier can help congestion.   GET HELP RIGHT AWAY IF: . You develop worsening fever. . You become short of breath . You cough up blood. . Your symptoms persist after you have completed your treatment plan MAKE SURE YOU   Understand these instructions.  Will watch your condition.  Will get help right away if you are not doing well or get worse.  Your e-visit answers were reviewed by a board certified advanced clinical practitioner to complete your personal care plan.    Depending on the condition, your plan could have included both over the counter or prescription medications. If there is a problem please reply  once you have received a response from your provider. Your safety is important to us.  If you have drug allergies check your prescription carefully.    You can use MyChart to ask questions about today's visit, request a non-urgent call back, or ask for a work or school excuse for 24 hours related to this e-Visit. If it has been greater than 24  hours you will need to follow up with your provider, or enter a new e-Visit to address those concerns. You will get an e-mail in the next two days asking about your experience.  I hope that your e-visit has been valuable and will speed your recovery. Thank you for using e-visits.   

## 2018-05-15 NOTE — Addendum Note (Signed)
Addended by: Jannifer Rodney A on: 05/15/2018 08:01 PM   Modules accepted: Orders

## 2018-05-31 ENCOUNTER — Encounter: Attending: Internal Medicine | Primary: Internal Medicine

## 2018-06-16 ENCOUNTER — Encounter: Payer: Self-pay | Admitting: Family Medicine

## 2018-06-19 ENCOUNTER — Other Ambulatory Visit: Payer: Self-pay

## 2018-06-20 ENCOUNTER — Encounter: Payer: Self-pay | Admitting: Family

## 2018-06-20 ENCOUNTER — Ambulatory Visit (INDEPENDENT_AMBULATORY_CARE_PROVIDER_SITE_OTHER): Payer: Managed Care, Other (non HMO) | Admitting: Family

## 2018-06-20 DIAGNOSIS — E669 Obesity, unspecified: Secondary | ICD-10-CM | POA: Diagnosis not present

## 2018-06-20 DIAGNOSIS — Z713 Dietary counseling and surveillance: Secondary | ICD-10-CM

## 2018-06-20 MED ORDER — PHENTERMINE HCL 37.5 MG PO TABS: 18.7500 mg | ORAL_TABLET | Freq: Every day | ORAL | 2 refills | Status: DC

## 2018-06-20 NOTE — Progress Notes (Signed)
   Virtual Visit via telephone Note  I connected with Kim Klein on 06/20/18 at 8:50 AM by telephone and verified that I am speaking with the correct person using two identifiers. Kim Klein is currently located at home and no one  is currently with her during visit. The provider, Evelina Dun, FNP is located in their office at time of visit.  I discussed the limitations, risks, security and privacy concerns of performing an evaluation and management service by telephone and the availability of in person appointments. I also discussed with the patient that there may be a patient responsible charge related to this service. The patient expressed understanding and agreed to proceed.   History and Present Illness:  HPI PT calls today with weight gain and requesting a refill phentermine. She states she currently weights 208 ;b and reports gaining 10-12 lbs over the last two months. She states she lost her job on March 15th related to [place] and has not started back. She feels this may be contributing to her weight gain.   She has not been on phentermine since 10/19, but states when she took it she was able to lose 15-20 lbs. She also saw a nutritionist in the past and has restarted her diet over the last two weeks. She is requesting a refill on her phentermine to help jump start.    Review of Systems  All other systems reviewed and are negative.    Observations/Objective: No SOB or distress noted  Assessment and Plan: 1. Weight loss counseling, encounter for - phentermine (ADIPEX-P) 37.5 MG tablet; Take 0.5-1 tablets (18.75-37.5 mg total) by mouth daily before breakfast.  Dispense: 30 tablet; Refill: 2 - CMP14+EGFR  2. Obesity (BMI 30-39.9) - phentermine (ADIPEX-P) 37.5 MG tablet; Take 0.5-1 tablets (18.75-37.5 mg total) by mouth daily before breakfast.  Dispense: 30 tablet; Refill: 2 - CMP14+EGFR  Pt will come and get CMP Continue healthy diet and encouraged exercise Keep  follow up in 3 months with PCP   I discussed the assessment and treatment plan with the patient. The patient was provided an opportunity to ask questions and all were answered. The patient agreed with the plan and demonstrated an understanding of the instructions.   The patient was advised to call back or seek an in-person evaluation if the symptoms worsen or if the condition fails to improve as anticipated.  The above assessment and management plan was discussed with the patient. The patient verbalized understanding of and has agreed to the management plan. Patient is aware to call the clinic if symptoms persist or worsen. Patient is aware when to return to the clinic for a follow-up visit. Patient educated on when it is appropriate to go to the emergency department.   Time call ended:  9:01Am  I provided 11 minutes of non-face-to-face time during this encounter.    Evelina Dun, FNP

## 2018-06-21 NOTE — Telephone Encounter (Signed)
If she finished the prescription supplement she can take Vit D3 1000 international units OTC  1 tab po daily

## 2018-07-02 ENCOUNTER — Inpatient Hospital Stay: Admit: 2018-07-02 | Payer: PRIVATE HEALTH INSURANCE | Attending: Internal Medicine | Primary: Internal Medicine

## 2018-07-02 DIAGNOSIS — Z0000 Encounter for general adult medical examination without abnormal findings: Secondary | ICD-10-CM

## 2018-07-04 ENCOUNTER — Encounter

## 2018-07-04 ENCOUNTER — Inpatient Hospital Stay: Admit: 2018-07-04 | Payer: PRIVATE HEALTH INSURANCE | Attending: Internal Medicine | Primary: Internal Medicine

## 2018-07-04 DIAGNOSIS — R928 Other abnormal and inconclusive findings on diagnostic imaging of breast: Secondary | ICD-10-CM

## 2018-07-04 DIAGNOSIS — Z1231 Encounter for screening mammogram for malignant neoplasm of breast: Secondary | ICD-10-CM

## 2018-07-04 NOTE — Progress Notes (Signed)
Benign mammogram  Follow up in one year

## 2018-07-11 ENCOUNTER — Encounter: Payer: Self-pay | Admitting: Family Medicine

## 2018-07-12 ENCOUNTER — Other Ambulatory Visit: Payer: Self-pay | Admitting: Family Medicine

## 2018-07-15 ENCOUNTER — Ambulatory Visit (INDEPENDENT_AMBULATORY_CARE_PROVIDER_SITE_OTHER): Payer: Managed Care, Other (non HMO) | Admitting: Family Medicine

## 2018-07-15 ENCOUNTER — Other Ambulatory Visit: Payer: Self-pay

## 2018-07-15 ENCOUNTER — Encounter: Payer: Self-pay | Admitting: Family Medicine

## 2018-07-15 DIAGNOSIS — N898 Other specified noninflammatory disorders of vagina: Secondary | ICD-10-CM

## 2018-07-15 MED ORDER — FLUCONAZOLE 150 MG PO TABS: 150.0000 mg | ORAL_TABLET | Freq: Once | ORAL | 0 refills | Status: AC

## 2018-07-15 MED ORDER — VITAMIN D (ERGOCALCIFEROL) 1.25 MG (50000 UNIT) PO CAPS: 50000.0000 [IU] | ORAL_CAPSULE | ORAL | 0 refills | Status: AC

## 2018-07-15 NOTE — Patient Instructions (Signed)
Vaginal Yeast infection, Adult    Vaginal yeast infection is a condition that causes vaginal discharge as well as soreness, swelling, and redness (inflammation) of the vagina. This is a common condition. Some women get this infection frequently.  What are the causes?  This condition is caused by a change in the normal balance of the yeast (candida) and bacteria that live in the vagina. This change causes an overgrowth of yeast, which causes the inflammation.  What increases the risk?  The condition is more likely to develop in women who:   Take antibiotic medicines.   Have diabetes.   Take birth control pills.   Are pregnant.   Douche often.   Have a weak body defense system (immune system).   Have been taking steroid medicines for a long time.   Frequently wear tight clothing.  What are the signs or symptoms?  Symptoms of this condition include:   White, thick, creamy vaginal discharge.   Swelling, itching, redness, and irritation of the vagina. The lips of the vagina (vulva) may be affected as well.   Pain or a burning feeling while urinating.   Pain during sex.  How is this diagnosed?  This condition is diagnosed based on:   Your medical history.   A physical exam.   A pelvic exam. Your health care provider will examine a sample of your vaginal discharge under a microscope. Your health care provider may send this sample for testing to confirm the diagnosis.  How is this treated?  This condition is treated with medicine. Medicines may be over-the-counter or prescription. You may be told to use one or more of the following:   Medicine that is taken by mouth (orally).   Medicine that is applied as a cream (topically).   Medicine that is inserted directly into the vagina (suppository).  Follow these instructions at home:    Lifestyle   Do not have sex until your health care provider approves. Tell your sex partner that you have a yeast infection. That person should go to his or her health care  provider and ask if they should also be treated.   Do not wear tight clothes, such as pantyhose or tight pants.   Wear breathable cotton underwear.  General instructions   Take or apply over-the-counter and prescription medicines only as told by your health care provider.   Eat more yogurt. This may help to keep your yeast infection from returning.   Do not use tampons until your health care provider approves.   Try taking a sitz bath to help with discomfort. This is a warm water bath that is taken while you are sitting down. The water should only come up to your hips and should cover your buttocks. Do this 3-4 times per day or as told by your health care provider.   Do not douche.   If you have diabetes, keep your blood sugar levels under control.   Keep all follow-up visits as told by your health care provider. This is important.  Contact a health care provider if:   You have a fever.   Your symptoms go away and then return.   Your symptoms do not get better with treatment.   Your symptoms get worse.   You have new symptoms.   You develop blisters in or around your vagina.   You have blood coming from your vagina and it is not your menstrual period.   You develop pain in your abdomen.  Summary     Vaginal yeast infection is a condition that causes discharge as well as soreness, swelling, and redness (inflammation) of the vagina.   This condition is treated with medicine. Medicines may be over-the-counter or prescription.   Take or apply over-the-counter and prescription medicines only as told by your health care provider.   Do not douche. Do not have sex or use tampons until your health care provider approves.   Contact a health care provider if your symptoms do not get better with treatment or your symptoms go away and then return.  This information is not intended to replace advice given to you by your health care provider. Make sure you discuss any questions you have with your health care  provider.  Document Released: 10/26/2004 Document Revised: 06/04/2017 Document Reviewed: 06/04/2017  Elsevier Interactive Patient Education  2019 [employer]

## 2018-07-15 NOTE — Progress Notes (Signed)
Telephone visit  Subjective: CC: Vaginal discharge PCP: Janora Norlander, DO [id number] H Kim Klein is a 38 y.o. female calls for telephone consult today. Patient provides verbal consent for consult held via phone.  Location of patient: home Location of provider: [facility] Others present for call: none  1. Vaginal Discharge  Patient reports that discharge started about a week ago.  She notes that discharge appears yellowish, thick and cheeselike.  She denies vaginal odors.  She reports vaginal pruritis.denies abnormal vaginal bleeding, pelvic pain, nausea, vomiting, fevers.   Contraception: OCPs.  Patient's last menstrual period was 06/24/2018.  ROS: Per HPI  Allergies  Allergen Reactions  . Augmentin [Amoxicillin-Pot Clavulanate] Other (See Comments)    Stomach pain and rectal bleeding  . Cinnamon   . Strawberry Extract    Past Medical History:  Diagnosis Date  . Depression   . PCOS (polycystic ovarian syndrome)     Current Outpatient Medications:  .  albuterol (PROVENTIL HFA;VENTOLIN HFA) 108 (90 Base) MCG/ACT inhaler, Inhale 2 puffs into the lungs every 6 (six) hours as needed for wheezing or shortness of breath., Disp: 1 Inhaler, Rfl: 0 .  cetirizine (ZYRTEC) 10 MG tablet, TAKE 1 TABLET BY MOUTH EVERY DAY, Disp: 90 tablet, Rfl: 1 .  escitalopram (LEXAPRO) 20 MG tablet, Take 1 tablet (20 mg total) by mouth daily., Disp: 90 tablet, Rfl: 0 .  levonorgestrel-ethinyl estradiol (LILLOW) 0.15-30 MG-MCG tablet, Take 1 tablet by mouth daily., Disp: 84 tablet, Rfl: 3 .  LORazepam (ATIVAN) 0.5 MG tablet, Take 1 tablet (0.5 mg total) by mouth 2 (two) times daily as needed for anxiety. (Patient not taking: Reported on 06/20/2018), Disp: 20 tablet, Rfl: 0 .  metFORMIN (GLUCOPHAGE-XR) 500 MG 24 hr tablet, Take 2 tablets (1,000 mg total) by mouth daily with supper., Disp: 180 tablet, Rfl: 1 .  phentermine (ADIPEX-P) 37.5 MG tablet, Take 0.5-1 tablets (18.75-37.5 mg total) by mouth daily before  breakfast., Disp: 30 tablet, Rfl: 2 .  Vitamin D, Ergocalciferol, (DRISDOL) 1.25 MG (50000 UT) CAPS capsule, Take 1 capsule (50,000 Units total) by mouth every 7 (seven) days., Disp: 12 capsule, Rfl: 0  Assessment/ Plan: 38 y.o. female   1. Vaginal discharge Presumed yeast vaginitis.  Differential diagnosis includes bacterial vaginosis.  Diflucan sent.  Take 1 tablet now.  If persistent symptoms in 3 days may repeat.  If symptoms do not resolve she is to contact me for evaluation at which point we should consider treatment for BV. - fluconazole (DIFLUCAN) 150 MG tablet; Take 1 tablet (150 mg total) by mouth once for 1 dose. Repeat in 3 days if symptoms persistent  Dispense: 2 tablet; Refill: 0   Start time: 9:47am End time: 9:51am  Total time spent on patient care (including telephone call/ virtual visit): 9 minutes  [place], [place] [phone]

## 2018-07-19 ENCOUNTER — Ambulatory Visit: Payer: Managed Care, Other (non HMO) | Admitting: Family Medicine

## 2018-07-19 ENCOUNTER — Telehealth: Payer: Managed Care, Other (non HMO) | Admitting: Family

## 2018-07-19 DIAGNOSIS — N760 Acute vaginitis: Secondary | ICD-10-CM

## 2018-07-19 DIAGNOSIS — B9689 Other specified bacterial agents as the cause of diseases classified elsewhere: Secondary | ICD-10-CM

## 2018-07-19 MED ORDER — METRONIDAZOLE 500 MG PO TABS: 500.0000 mg | ORAL_TABLET | Freq: Two times a day (BID) | ORAL | 0 refills | Status: DC

## 2018-07-19 NOTE — Progress Notes (Signed)

## 2018-08-08 ENCOUNTER — Encounter: Payer: Self-pay | Admitting: Family Medicine

## 2018-08-15 ENCOUNTER — Other Ambulatory Visit: Payer: Self-pay | Admitting: Family Medicine

## 2018-08-26 ENCOUNTER — Ambulatory Visit: Admit: 2018-08-26 | Discharge: 2018-08-26 | Payer: PRIVATE HEALTH INSURANCE | Primary: Internal Medicine

## 2018-08-26 ENCOUNTER — Ambulatory Visit: Primary: Internal Medicine

## 2018-08-26 DIAGNOSIS — Z20822 Contact with and (suspected) exposure to covid-19: Secondary | ICD-10-CM

## 2018-08-26 DIAGNOSIS — Z20828 Contact with and (suspected) exposure to other viral communicable diseases: Secondary | ICD-10-CM

## 2018-08-26 NOTE — Progress Notes (Signed)
This patient was seen in Flu Clinic at [facility] while in their vehicle due to COVID-19 pandemic with PPE and focused examination in order to decrease community viral transmission.     The patient/guardian gave verbal consent to treat.      Nasal Congestion   This is a new problem. The current episode started more than 2 days ago. The problem occurs constantly. The problem has not changed since onset.Associated symptoms include headaches. Pertinent negatives include no chest pain, no abdominal pain and no shortness of breath. Nothing aggravates the symptoms. Nothing relieves the symptoms. She has tried nothing for the symptoms.        Past Medical History:   Diagnosis Date   ??? Anxiety    ??? PCOS (polycystic ovarian syndrome)    ??? Sciatica         Past Surgical History:   Procedure Laterality Date   ??? BREAST SURGERY PROCEDURE UNLISTED      r breast cyst removal   ??? HX ORTHOPAEDIC           Family History   Problem Relation Age of Onset   ??? Cancer Maternal Aunt         breast   ??? Diabetes Maternal Grandmother    ??? Cancer Maternal Grandmother         breast   ??? Breast Cancer Maternal Grandmother         Social History     Socioeconomic History   ??? Marital status: DIVORCED     Spouse name: Not on file   ??? Number of children: Not on file   ??? Years of education: Not on file   ??? Highest education level: Not on file   Occupational History   ??? Not on file   Social Needs   ??? Financial resource strain: Not on file   ??? Food insecurity     Worry: Not on file     Inability: Not on file   ??? Transportation needs     Medical: Not on file     Non-medical: Not on file   Tobacco Use   ??? Smoking status: Never Smoker   ??? Smokeless tobacco: Never Used   Substance and Sexual Activity   ??? Alcohol use: Yes   ??? Drug use: Not on file   ??? Sexual activity: Not on file   Lifestyle   ??? Physical activity     Days per week: Not on file     Minutes per session: Not on file   ??? Stress: Not on file   Relationships    ??? Social Product manager on phone: Not on file     Gets together: Not on file     Attends religious service: Not on file     Active member of club or organization: Not on file     Attends meetings of clubs or organizations: Not on file     Relationship status: Not on file   ??? Intimate partner violence     Fear of current or ex partner: Not on file     Emotionally abused: Not on file     Physically abused: Not on file     Forced sexual activity: Not on file   Other Topics Concern   ??? Not on file   Social History Narrative   ??? Not on file  ALLERGIES: Strawberry and Augmentin [amoxicillin-pot clavulanate]    Review of Systems   HENT: Positive for congestion. Negative for rhinorrhea and sneezing.    Respiratory: Negative for shortness of breath.    Cardiovascular: Negative for chest pain.   Gastrointestinal: Negative for abdominal pain.   Neurological: Positive for headaches.   All other systems reviewed and are negative.      Vitals:    08/26/18 1302   Pulse: 80   Resp: 16   Temp: 97.8 ??F (36.6 ??C)   SpO2: 96%       Physical Exam  Vitals signs and nursing note reviewed.   Constitutional:       General: She is not in acute distress.     Appearance: She is not ill-appearing.   Pulmonary:      Effort: Pulmonary effort is normal. No respiratory distress.      Breath sounds: No wheezing.         MDM    Procedures        ICD-10-CM ICD-9-CM    1. Suspected COVID-19 virus infection  Z20.828 V01.79 NOVEL CORONAVIRUS (COVID-19)   Fluids/ gargles  Claritin/ allegra   Tylenol cold-sinus - max strength 1-2 tab 4 times/ day    with Advil as needed        No orders of the defined types were placed in this encounter.    No results found for any visits on 08/26/18.  The patients condition was discussed with the patient and they understand.  The patient is to follow up with primary care doctor.  If signs and symptoms become worse the pt is to go to the ER. The patient is to take medications as prescribed.

## 2018-08-26 NOTE — Progress Notes (Signed)
This patient was seen in Flu Clinic at [facility] while in their vehicle due to COVID-19 pandemic with PPE and focused examination in order to decrease community viral transmission.     The patient/guardian gave verbal consent to treat.      Nasal Congestion   This is a new problem. The current episode started more than 2 days ago. The problem occurs constantly. The problem has not changed since onset.Associated symptoms include headaches. Pertinent negatives include no chest pain, no abdominal pain and no shortness of breath. Nothing aggravates the symptoms. Nothing relieves the symptoms. She has tried nothing for the symptoms.        Past Medical History:   Diagnosis Date   ??? Anxiety    ??? PCOS (polycystic ovarian syndrome)    ??? Sciatica         Past Surgical History:   Procedure Laterality Date   ??? BREAST SURGERY PROCEDURE UNLISTED      r breast cyst removal   ??? HX ORTHOPAEDIC           Family History   Problem Relation Age of Onset   ??? Cancer Maternal Aunt         breast   ??? Diabetes Maternal Grandmother    ??? Cancer Maternal Grandmother         breast   ??? Breast Cancer Maternal Grandmother         Social History     Socioeconomic History   ??? Marital status: DIVORCED     Spouse name: Not on file   ??? Number of children: Not on file   ??? Years of education: Not on file   ??? Highest education level: Not on file   Occupational History   ??? Not on file   Social Needs   ??? Financial resource strain: Not on file   ??? Food insecurity     Worry: Not on file     Inability: Not on file   ??? Transportation needs     Medical: Not on file     Non-medical: Not on file   Tobacco Use   ??? Smoking status: Never Smoker   ??? Smokeless tobacco: Never Used   Substance and Sexual Activity   ??? Alcohol use: Yes   ??? Drug use: Not on file   ??? Sexual activity: Not on file   Lifestyle   ??? Physical activity     Days per week: Not on file     Minutes per session: Not on file   ??? Stress: Not on file   Relationships   ??? Social  Wellsite geologistconnections     Talks on phone: Not on file     Gets together: Not on file     Attends religious service: Not on file     Active member of club or organization: Not on file     Attends meetings of clubs or organizations: Not on file     Relationship status: Not on file   ??? Intimate partner violence     Fear of current or ex partner: Not on file     Emotionally abused: Not on file     Physically abused: Not on file     Forced sexual activity: Not on file   Other Topics Concern   ??? Not on file   Social History Narrative   ??? Not on file  ALLERGIES: Strawberry and Augmentin [amoxicillin-pot clavulanate]    Review of Systems   HENT: Positive for congestion. Negative for rhinorrhea and sneezing.    Respiratory: Negative for shortness of breath.    Cardiovascular: Negative for chest pain.   Gastrointestinal: Negative for abdominal pain.   Neurological: Positive for headaches.   All other systems reviewed and are negative.      Vitals:    08/26/18 1302   Pulse: 80   Resp: 16   Temp: 97.8 ??F (36.6 ??C)   SpO2: 96%       Physical Exam  Vitals signs and nursing note reviewed.   Constitutional:       General: She is not in acute distress.     Appearance: She is not ill-appearing.   Pulmonary:      Effort: Pulmonary effort is normal. No respiratory distress.      Breath sounds: No wheezing.         MDM    Procedures        ICD-10-CM ICD-9-CM    1. Suspected COVID-19 virus infection  Z20.828 V01.79 NOVEL CORONAVIRUS (COVID-19)   Fluids/ gargles  Claritin/ allegra   Tylenol cold-sinus - max strength 1-2 tab 4 times/ day    with Advil as needed        No orders of the defined types were placed in this encounter.    No results found for any visits on 08/26/18.  The patients condition was discussed with the patient and they understand.  The patient is to follow up with primary care doctor.  If signs and symptoms become worse the pt is to go to the ER. The patient is to take medications as prescribed.

## 2018-08-27 ENCOUNTER — Ambulatory Visit
Admit: 2018-08-27 | Discharge: 2018-08-27 | Payer: PRIVATE HEALTH INSURANCE | Attending: Adolescent Medicine | Primary: Internal Medicine

## 2018-08-27 ENCOUNTER — Ambulatory Visit: Attending: Adolescent Medicine | Primary: Internal Medicine

## 2018-08-27 DIAGNOSIS — G43511 Persistent migraine aura without cerebral infarction, intractable, with status migrainosus: Secondary | ICD-10-CM

## 2018-08-27 MED ORDER — SUMATRIPTAN 50 MG TAB
50 mg | ORAL_TABLET | Freq: Once | ORAL | 0 refills | Status: AC | PRN
Start: 2018-08-27 — End: 2018-08-27

## 2018-08-27 MED ORDER — IBUPROFEN 800 MG TAB
800 mg | ORAL_TABLET | Freq: Three times a day (TID) | ORAL | 0 refills | Status: AC | PRN
Start: 2018-08-27 — End: ?

## 2018-08-27 NOTE — Progress Notes (Signed)
Chief Complaint   Patient presents with   ??? Headache     x 4 days; 7 to 8 on pain scale     1. Have you been to the ER, urgent care clinic since your last visit?  Hospitalized since your last visit?Yes When: 08/26/2018 Where: [facility] Reason for visit: Covid-19 testing    2. Have you seen or consulted any other health care providers outside of the [facility] since your last visit?  Include any pap smears or colon screening. No

## 2018-08-27 NOTE — Progress Notes (Signed)
SPORTS MEDICINE AND PRIMARY CARE  Dorthula Nettles. Jonae Renshaw,MD  [place] [address]      Chief Complaint   Patient presents with   ??? Headache     x 4 days; 7 to 8 on pain scale       SUBJECTIVE:    Lauren Rocha is a 38 y.o. female patient of Donnel Saxon, MD for evaluation of headache.  Patient has a remote diagnosis of migraine and history of depression and stable OCP use..    Patient reports a 2-day history of headache  similar to migraine she has had in the past with throbbing right sided unilateral ache preceded by aura described as a wood-burning smell and accompanied by light sensitivity.  Patient does not endorse nausea. Triggers are unknown but patient suspects heat is one.  Headaches have been recurring recently.      COVID test result is pending following a call in t.o [place] about headache and being directed to get tested.    Current Outpatient Medications   Medication Sig Dispense Refill   ??? SUMAtriptan (IMITREX) 50 mg tablet Take 1 Tab by mouth once as needed for Migraine for up to 1 dose. You may repeat 1 dose in 2 hr as needed. Use a max of 2 tabs per 24 hr 10 Tab 0   ??? ibuprofen (MOTRIN) 800 mg tablet Take 1 Tab by mouth every eight (8) hours as needed for Pain. Take with food or milk; report use over 3x a week 15 Tab 0   ??? cholecalciferol (VITAMIN D3) (50,000 UNITS /1250 MCG) capsule Take 1 Cap by mouth every seven (7) days. Indications: low vitamin D levels 8 Cap 0   ??? LORazepam (ATIVAN) 0.5 mg tablet Take 0.5 mg by mouth.     ??? metFORMIN ER (GLUCOPHAGE XR) 500 mg tablet Take 2 tabs po daily (Patient taking differently: Take 750 mg by mouth daily (with dinner). Take 2 tabs po daily) 180 Tab 1   ??? escitalopram oxalate (LEXAPRO) 20 mg tablet Take 1 Tab by mouth daily. 90 Tab 1   ??? levonorgestrel-ethinyl estradiol (KURVELO, 28,) 0.15-0.03 mg tab Take 1 Tab by mouth daily. 90 Package 3   ??? cetirizine (ZYRTEC) 10 mg tablet TAKE 1 TABLET BY MOUTH EVERY DAY 90 Tab 1     Past Medical History:    Diagnosis Date   ??? Anxiety    ??? PCOS (polycystic ovarian syndrome)    ??? Sciatica      Past Surgical History:   Procedure Laterality Date   ??? BREAST SURGERY PROCEDURE UNLISTED      r breast cyst removal   ??? HX ORTHOPAEDIC       Allergies   Allergen Reactions   ??? Strawberry Anaphylaxis   ??? Augmentin [Amoxicillin-Pot Clavulanate] Other (comments)     Rectal bleeding         REVIEW OF SYSTEMS:  General: negative for - chills or fever  ENT: +headaches; negative for -  nasal congestion or tinnitus  Respiratory: negative for - cough, hemoptysis, shortness of breath or wheezing  Cardiovascular : negative for - chest pain, edema, palpitations or shortness of breath  Gastrointestinal: negative for - abdominal pain, blood in stools, heartburn or nausea/vomiting  Genito-Urinary: no dysuria, trouble voiding, or hematuria  Musculoskeletal: negative for - gait disturbance, joint pain, joint stiffness or joint swelling  Neurological: no TIA or stroke symptoms  Hematologic: no bruises, no bleeding, no swollen glands  Integument: no lumps, mole changes, nail changes or rash  Endocrine:no malaise/lethargy or unexpected weight changes      Social History     Socioeconomic History   ??? Marital status: DIVORCED     Spouse name: Not on file   ??? Number of children: Not on file   ??? Years of education: Not on file   ??? Highest education level: Not on file   Tobacco Use   ??? Smoking status: Never Smoker   ??? Smokeless tobacco: Never Used   Substance and Sexual Activity   ??? Alcohol use: Yes     Family History   Problem Relation Age of Onset   ??? Cancer Maternal Aunt         breast   ??? Diabetes Maternal Grandmother    ??? Cancer Maternal Grandmother         breast   ??? Breast Cancer Maternal Grandmother        OBJECTIVE:     Visit Vitals  BP 127/86   Pulse 78   Temp 98 ??F (36.7 ??C) (Oral)   Resp 16   Ht _0  (1.575 m)   Wt 217 lb 12.8 oz (98.8 kg)   LMP 08/26/2018   SpO2 98%   BMI 39.84 kg/m??     CONSTITUTIONAL: well developed, well nourished,  appears age appropriate, no acute distress  EYES: perrla, eom intact  ENMT:moist mucous membranes, pharynx clear, TM intact, nares patent  NECK: supple. Thyroid normal  RESPIRATORY: Chest: clear bilaterally  CARDIOVASCULAR: Heart: regular rate and rhythm  GASTROINTESTINAL: Abdomen: soft, bowel sounds active  HEMATOLOGIC: no pathological lymph nodes palpated  MUSCULOSKELETAL: Extremities: no edema, pulse 3+   INTEGUMENT: Normal texture and turgor no unusual rashes or suspicious skin lesions noted.   NEUROLOGIC: non-focal exam   MENTAL STATUS: alert and oriented, appropriate affect     No visits with results within 3 Month(s) from this visit.   Latest known visit with results is:   Office Visit on 03/22/2018   Component Date Value Ref Range Status   ??? VALID INTERNAL CONTROL POC 03/22/2018 Yes   Final   ??? HCG urine, Ql. (POC) 03/22/2018 Negative  Negative Final   ??? Color (UA POC) 03/22/2018 Yellow   Final   ??? Clarity (UA POC) 03/22/2018 Clear   Final   ??? Glucose (UA POC) 03/22/2018 Negative  Negative Final   ??? Bilirubin (UA POC) 03/22/2018 Negative  Negative Final   ??? Ketones (UA POC) 03/22/2018 Negative  Negative Final   ??? Specific gravity (UA POC) 03/22/2018 1.030  1.001 - 1.035 Final   ??? Blood (UA POC) 03/22/2018 1+  Negative Final   ??? pH (UA POC) 03/22/2018 5.5  4.6 - 8.0 Final   ??? Protein (UA POC) 03/22/2018 Negative  Negative Final   ??? Urobilinogen (UA POC) 03/22/2018 0.2 mg/dL  0.2 - 1 Final   ??? Nitrites (UA POC) 03/22/2018 Negative  Negative Final   ??? Leukocyte esterase (UA POC) 03/22/2018 Negative  Negative Final   ??? Glucose 03/22/2018 61* 65 - 99 mg/dL Final   ??? BUN 03/22/2018 11  6 - 20 mg/dL Final   ??? Creatinine 03/22/2018 0.76  0.57 - 1.00 mg/dL Final   ??? GFR est non-AA 03/22/2018 101  >59 mL/min/1.73 Final   ??? GFR est AA 03/22/2018 117  >59 mL/min/1.73 Final   ??? BUN/Creatinine ratio 03/22/2018 14  9 - 23 Final   ??? Sodium 03/22/2018 139  134 - 144  mmol/L Final   ??? Potassium 03/22/2018 4.2  3.5 - 5.2 mmol/L  Final   ??? Chloride 03/22/2018 103  96 - 106 mmol/L Final   ??? CO2 03/22/2018 20  20 - 29 mmol/L Final   ??? Calcium 03/22/2018 9.1  8.7 - 10.2 mg/dL Final   ??? Protein, total 03/22/2018 7.2  6.0 - 8.5 g/dL Final   ??? Albumin 03/22/2018 4.3  3.8 - 4.8 g/dL Final                  **Please note reference interval change**   ??? GLOBULIN, TOTAL 03/22/2018 2.9  1.5 - 4.5 g/dL Final   ??? A-G Ratio 03/22/2018 1.5  1.2 - 2.2 Final   ??? Bilirubin, total 03/22/2018 0.3  0.0 - 1.2 mg/dL Final   ??? Alk. phosphatase 03/22/2018 53  39 - 117 IU/L Final   ??? AST (SGOT) 03/22/2018 10  0 - 40 IU/L Final   ??? ALT (SGPT) 03/22/2018 10  0 - 32 IU/L Final   ??? Cholesterol, total 03/22/2018 243* 100 - 199 mg/dL Final   ??? Triglyceride 03/22/2018 60  0 - 149 mg/dL Final   ??? HDL Cholesterol 03/22/2018 44  >39 mg/dL Final   ??? VLDL, calculated 03/22/2018 12  5 - 40 mg/dL Final   ??? LDL, calculated 03/22/2018 187* 0 - 99 mg/dL Final   ??? Hemoglobin A1c 03/22/2018 4.8  4.8 - 5.6 % Final    Comment:          Prediabetes: 5.7 - 6.4           Diabetes: >6.4           Glycemic control for adults with diabetes: <7.0     ??? WBC 03/22/2018 4.9  3.4 - 10.8 x10E3/uL Final   ??? RBC 03/22/2018 4.97  3.77 - 5.28 x10E6/uL Final   ??? HGB 03/22/2018 14.5  11.1 - 15.9 g/dL Final   ??? HCT 03/22/2018 44.3  34.0 - 46.6 % Final   ??? MCV 03/22/2018 89  79 - 97 fL Final   ??? MCH 03/22/2018 29.2  26.6 - 33.0 pg Final   ??? MCHC 03/22/2018 32.7  31.5 - 35.7 g/dL Final   ??? RDW 03/22/2018 12.4  11.7 - 15.4 % Final   ??? PLATELET 03/22/2018 403  150 - 450 x10E3/uL Final   ??? NEUTROPHILS 03/22/2018 40  Not Estab. % Final   ??? Lymphocytes 03/22/2018 52  Not Estab. % Final   ??? MONOCYTES 03/22/2018 6  Not Estab. % Final   ??? EOSINOPHILS 03/22/2018 1  Not Estab. % Final   ??? BASOPHILS 03/22/2018 1  Not Estab. % Final   ??? ABS. NEUTROPHILS 03/22/2018 2.0  1.4 - 7.0 x10E3/uL Final   ??? Abs Lymphocytes 03/22/2018 2.6  0.7 - 3.1 x10E3/uL Final   ??? ABS. MONOCYTES 03/22/2018 0.3  0.1 - 0.9 x10E3/uL Final   ??? ABS.  EOSINOPHILS 03/22/2018 0.1  0.0 - 0.4 x10E3/uL Final   ??? ABS. BASOPHILS 03/22/2018 0.0  0.0 - 0.2 x10E3/uL Final   ??? IMMATURE GRANULOCYTES 03/22/2018 0  Not Estab. % Final   ??? ABS. IMM. GRANS. 03/22/2018 0.0  0.0 - 0.1 x10E3/uL Final   ??? VITAMIN D, 25-HYDROXY 03/22/2018 15.3* 30.0 - 100.0 ng/mL Final    Comment: Vitamin D deficiency has been defined by the [place] practice guideline as a  level of serum 25-OH vitamin D less than 20 ng/mL (1,2).  The Endocrine Society went on to further define vitamin D  insufficiency as a level between 21 and 29 ng/mL (2).  1. IOM (Institute of Medicine). 2010. Dietary reference     intakes for calcium and D. [place]: The     [employer].  2. Holick MF, Binkley NC, Bischoff-Ferrari HA, et al.     Evaluation, treatment, and prevention of vitamin D     deficiency: an Endocrine Society clinical practice     guideline. JCEM. 2011 Jul; 96(7):1911-30.         ASSESSMENT:   1. 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  The patient understands and agees with the plan.  The patient has   received an after visit summary and questions were answered concerning  future plans  Patient labs and/or xrays were reviewed  Past records were reviewed.    PLAN:  .  Orders Placed This Encounter   ??? SUMAtriptan (IMITREX) 50 mg tablet at headache onset; repeat dose in 2 hours as needed   ??? ibuprofen (MOTRIN) 800 mg tablet every 8-12 hour intervals with food as needed   Headache diary recommended.  Headache trigger avoidance.  Patient advised to report use of abortive medications over twice a week at which time migraine prophylaxis could be considered  Counseled regarding diet, exercise and healthy lifestyle  Advised patient to call back or return to office if symptoms worsen/change/persist.  Discussed expected  course/resolution/complications of diagnosis in detail with patient.     Medication risks/benefits/costs/interactions/alternatives discussed with patient  Follow-up and Dispositions    ?? Return if symptoms worsen or fail to improve.       Signed,  Nikki Dom M.D.    A total of at least 25 min was spent during this evaluation of which half was spent in counseling and care coordination.      This note was created using voice recognition software.  Edits have been made but syntax errors might exist.

## 2018-08-27 NOTE — Progress Notes (Signed)
Chief Complaint   Patient presents with   . Headache     x 4 days; 7 to 8 on pain scale     1. Have you been to the ER, urgent care clinic since your last visit?  Hospitalized since your last visit?Yes When: 08/26/2018 Where: [facility] Reason for visit: Covid-19 testing    2. Have you seen or consulted any other health care providers outside of the [facility] since your last visit?  Include any pap smears or colon screening. No

## 2018-08-27 NOTE — Progress Notes (Signed)
SPORTS MEDICINE AND PRIMARY CARE  Dorthula Nettles. Julus Kelley,MD  [place] [address]      Chief Complaint   Patient presents with   ??? Headache     x 4 days; 7 to 8 on pain scale       SUBJECTIVE:    Lauren Rocha is a 38 y.o. female patient of Donnel Saxon, MD for evaluation of headache.  Patient has a remote diagnosis of migraine and history of depression and stable OCP use..    Patient reports a 2-day history of headache  similar to migraine she has had in the past with throbbing right sided unilateral ache preceded by aura described as a wood-burning smell and accompanied by light sensitivity.  Patient does not endorse nausea. Triggers are unknown but patient suspects heat is one.  Headaches have been recurring recently.      COVID test result is pending following a call in t.o [place] about headache and being directed to get tested.    Current Outpatient Medications   Medication Sig Dispense Refill   ??? SUMAtriptan (IMITREX) 50 mg tablet Take 1 Tab by mouth once as needed for Migraine for up to 1 dose. You may repeat 1 dose in 2 hr as needed. Use a max of 2 tabs per 24 hr 10 Tab 0   ??? ibuprofen (MOTRIN) 800 mg tablet Take 1 Tab by mouth every eight (8) hours as needed for Pain. Take with food or milk; report use over 3x a week 15 Tab 0   ??? cholecalciferol (VITAMIN D3) (50,000 UNITS /1250 MCG) capsule Take 1 Cap by mouth every seven (7) days. Indications: low vitamin D levels 8 Cap 0   ??? LORazepam (ATIVAN) 0.5 mg tablet Take 0.5 mg by mouth.     ??? metFORMIN ER (GLUCOPHAGE XR) 500 mg tablet Take 2 tabs po daily (Patient taking differently: Take 750 mg by mouth daily (with dinner). Take 2 tabs po daily) 180 Tab 1   ??? escitalopram oxalate (LEXAPRO) 20 mg tablet Take 1 Tab by mouth daily. 90 Tab 1   ??? levonorgestrel-ethinyl estradiol (KURVELO, 28,) 0.15-0.03 mg tab Take 1 Tab by mouth daily. 90 Package 3   ??? cetirizine (ZYRTEC) 10 mg tablet TAKE 1 TABLET BY MOUTH EVERY DAY 90 Tab 1      Past Medical History:   Diagnosis Date   ??? Anxiety    ??? PCOS (polycystic ovarian syndrome)    ??? Sciatica      Past Surgical History:   Procedure Laterality Date   ??? BREAST SURGERY PROCEDURE UNLISTED      r breast cyst removal   ??? HX ORTHOPAEDIC       Allergies   Allergen Reactions   ??? Strawberry Anaphylaxis   ??? Augmentin [Amoxicillin-Pot Clavulanate] Other (comments)     Rectal bleeding         REVIEW OF SYSTEMS:  General: negative for - chills or fever  ENT: +headaches; negative for -  nasal congestion or tinnitus  Respiratory: negative for - cough, hemoptysis, shortness of breath or wheezing  Cardiovascular : negative for - chest pain, edema, palpitations or shortness of breath  Gastrointestinal: negative for - abdominal pain, blood in stools, heartburn or nausea/vomiting  Genito-Urinary: no dysuria, trouble voiding, or hematuria  Musculoskeletal: negative for - gait disturbance, joint pain, joint stiffness or joint swelling  Neurological: no TIA or stroke symptoms  Hematologic: no bruises, no bleeding, no swollen glands  Integument: no lumps, mole changes, nail changes or rash  Endocrine:no malaise/lethargy or unexpected weight changes      Social History     Socioeconomic History   ??? Marital status: DIVORCED     Spouse name: Not on file   ??? Number of children: Not on file   ??? Years of education: Not on file   ??? Highest education level: Not on file   Tobacco Use   ??? Smoking status: Never Smoker   ??? Smokeless tobacco: Never Used   Substance and Sexual Activity   ??? Alcohol use: Yes     Family History   Problem Relation Age of Onset   ??? Cancer Maternal Aunt         breast   ??? Diabetes Maternal Grandmother    ??? Cancer Maternal Grandmother         breast   ??? Breast Cancer Maternal Grandmother        OBJECTIVE:     Visit Vitals  BP 127/86   Pulse 78   Temp 98 ??F (36.7 ??C) (Oral)   Resp 16   Ht '5\' 2"'  (1.575 m)   Wt 217 lb 12.8 oz (98.8 kg)   LMP 08/26/2018   SpO2 98%   BMI 39.84 kg/m??      CONSTITUTIONAL: well developed, well nourished, appears age appropriate, no acute distress  EYES: perrla, eom intact  ENMT:moist mucous membranes, pharynx clear, TM intact, nares patent  NECK: supple. Thyroid normal  RESPIRATORY: Chest: clear bilaterally  CARDIOVASCULAR: Heart: regular rate and rhythm  GASTROINTESTINAL: Abdomen: soft, bowel sounds active  HEMATOLOGIC: no pathological lymph nodes palpated  MUSCULOSKELETAL: Extremities: no edema, pulse 3+   INTEGUMENT: Normal texture and turgor no unusual rashes or suspicious skin lesions noted.   NEUROLOGIC: non-focal exam   MENTAL STATUS: alert and oriented, appropriate affect     No visits with results within 3 Month(s) from this visit.   Latest known visit with results is:   Office Visit on 03/22/2018   Component Date Value Ref Range Status   ??? VALID INTERNAL CONTROL POC 03/22/2018 Yes   Final   ??? HCG urine, Ql. (POC) 03/22/2018 Negative  Negative Final   ??? Color (UA POC) 03/22/2018 Yellow   Final   ??? Clarity (UA POC) 03/22/2018 Clear   Final   ??? Glucose (UA POC) 03/22/2018 Negative  Negative Final   ??? Bilirubin (UA POC) 03/22/2018 Negative  Negative Final   ??? Ketones (UA POC) 03/22/2018 Negative  Negative Final   ??? Specific gravity (UA POC) 03/22/2018 1.030  1.001 - 1.035 Final   ??? Blood (UA POC) 03/22/2018 1+  Negative Final   ??? pH (UA POC) 03/22/2018 5.5  4.6 - 8.0 Final   ??? Protein (UA POC) 03/22/2018 Negative  Negative Final   ??? Urobilinogen (UA POC) 03/22/2018 0.2 mg/dL  0.2 - 1 Final   ??? Nitrites (UA POC) 03/22/2018 Negative  Negative Final   ??? Leukocyte esterase (UA POC) 03/22/2018 Negative  Negative Final   ??? Glucose 03/22/2018 61* 65 - 99 mg/dL Final   ??? BUN 03/22/2018 11  6 - 20 mg/dL Final   ??? Creatinine 03/22/2018 0.76  0.57 - 1.00 mg/dL Final   ??? GFR est non-AA 03/22/2018 101  >59 mL/min/1.73 Final   ??? GFR est AA 03/22/2018 117  >59 mL/min/1.73 Final   ??? BUN/Creatinine ratio 03/22/2018 14  9 - 23 Final    ??? Sodium 03/22/2018 139  134 -  144 mmol/L Final   ??? Potassium 03/22/2018 4.2  3.5 - 5.2 mmol/L Final   ??? Chloride 03/22/2018 103  96 - 106 mmol/L Final   ??? CO2 03/22/2018 20  20 - 29 mmol/L Final   ??? Calcium 03/22/2018 9.1  8.7 - 10.2 mg/dL Final   ??? Protein, total 03/22/2018 7.2  6.0 - 8.5 g/dL Final   ??? Albumin 03/22/2018 4.3  3.8 - 4.8 g/dL Final                  **Please note reference interval change**   ??? GLOBULIN, TOTAL 03/22/2018 2.9  1.5 - 4.5 g/dL Final   ??? A-G Ratio 03/22/2018 1.5  1.2 - 2.2 Final   ??? Bilirubin, total 03/22/2018 0.3  0.0 - 1.2 mg/dL Final   ??? Alk. phosphatase 03/22/2018 53  39 - 117 IU/L Final   ??? AST (SGOT) 03/22/2018 10  0 - 40 IU/L Final   ??? ALT (SGPT) 03/22/2018 10  0 - 32 IU/L Final   ??? Cholesterol, total 03/22/2018 243* 100 - 199 mg/dL Final   ??? Triglyceride 03/22/2018 60  0 - 149 mg/dL Final   ??? HDL Cholesterol 03/22/2018 44  >39 mg/dL Final   ??? VLDL, calculated 03/22/2018 12  5 - 40 mg/dL Final   ??? LDL, calculated 03/22/2018 187* 0 - 99 mg/dL Final   ??? Hemoglobin A1c 03/22/2018 4.8  4.8 - 5.6 % Final    Comment:          Prediabetes: 5.7 - 6.4           Diabetes: >6.4           Glycemic control for adults with diabetes: <7.0     ??? WBC 03/22/2018 4.9  3.4 - 10.8 x10E3/uL Final   ??? RBC 03/22/2018 4.97  3.77 - 5.28 x10E6/uL Final   ??? HGB 03/22/2018 14.5  11.1 - 15.9 g/dL Final   ??? HCT 03/22/2018 44.3  34.0 - 46.6 % Final   ??? MCV 03/22/2018 89  79 - 97 fL Final   ??? MCH 03/22/2018 29.2  26.6 - 33.0 pg Final   ??? MCHC 03/22/2018 32.7  31.5 - 35.7 g/dL Final   ??? RDW 03/22/2018 12.4  11.7 - 15.4 % Final   ??? PLATELET 03/22/2018 403  150 - 450 x10E3/uL Final   ??? NEUTROPHILS 03/22/2018 40  Not Estab. % Final   ??? Lymphocytes 03/22/2018 52  Not Estab. % Final   ??? MONOCYTES 03/22/2018 6  Not Estab. % Final   ??? EOSINOPHILS 03/22/2018 1  Not Estab. % Final   ??? BASOPHILS 03/22/2018 1  Not Estab. % Final   ??? ABS. NEUTROPHILS 03/22/2018 2.0  1.4 - 7.0 x10E3/uL Final    ??? Abs Lymphocytes 03/22/2018 2.6  0.7 - 3.1 x10E3/uL Final   ??? ABS. MONOCYTES 03/22/2018 0.3  0.1 - 0.9 x10E3/uL Final   ??? ABS. EOSINOPHILS 03/22/2018 0.1  0.0 - 0.4 x10E3/uL Final   ??? ABS. BASOPHILS 03/22/2018 0.0  0.0 - 0.2 x10E3/uL Final   ??? IMMATURE GRANULOCYTES 03/22/2018 0  Not Estab. % Final   ??? ABS. IMM. GRANS. 03/22/2018 0.0  0.0 - 0.1 x10E3/uL Final   ??? VITAMIN D, 25-HYDROXY 03/22/2018 15.3* 30.0 - 100.0 ng/mL Final    Comment: Vitamin D deficiency has been defined by the [place] practice guideline as a  level of serum 25-OH vitamin D less than 20 ng/mL (1,2).  The Endocrine Society went on to further define vitamin D  insufficiency as a level between 21 and 29 ng/mL (2).  1. IOM (Institute of Medicine). 2010. Dietary reference     intakes for calcium and D. [place]: The     [employer].  2. Holick MF, Binkley NC, Bischoff-Ferrari HA, et al.     Evaluation, treatment, and prevention of vitamin D     deficiency: an Endocrine Society clinical practice     guideline. JCEM. 2011 Jul; 96(7):1911-30.         ASSESSMENT:   1. 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  The patient understands and agees with the plan.  The patient has   received an after visit summary and questions were answered concerning  future plans  Patient labs and/or xrays were reviewed  Past records were reviewed.    PLAN:  .  Orders Placed This Encounter   ??? SUMAtriptan (IMITREX) 50 mg tablet at headache onset; repeat dose in 2 hours as needed   ??? ibuprofen (MOTRIN) 800 mg tablet every 8-12 hour intervals with food as needed   Headache diary recommended.  Headache trigger avoidance.  Patient advised to report use of abortive medications over twice a week at which time migraine prophylaxis could be considered   Counseled regarding diet, exercise and healthy lifestyle  Advised patient to call back or return to office if symptoms worsen/change/persist.  Discussed expected course/resolution/complications of diagnosis in detail with patient.     Medication risks/benefits/costs/interactions/alternatives discussed with patient  Follow-up and Dispositions    ?? Return if symptoms worsen or fail to improve.       Signed,  Nikki Dom M.D.    A total of at least 25 min was spent during this evaluation of which half was spent in counseling and care coordination.      This note was created using voice recognition software.  Edits have been made but syntax errors might exist.

## 2018-08-27 NOTE — Patient Instructions (Signed)
Sumatriptan (By mouth)   Sumatriptan (soo-ma-TRIP-tan)  Treats migraine headaches.   Brand Name(s): Imitrex, Migraine Pack, Migranow   There may be other brand names for this medicine.  When This Medicine Should Not Be Used:   This medicine is not right for everyone. Do not use if you had an allergic reaction to sumatriptan. Tell your doctor if you have heart or blood vessel problems, such as a history of heart attack, stroke, or heart rhythm problems.  How to Use This Medicine:   Tablet  ?? Your doctor will tell you how much medicine to use. Do not use more than directed.  ?? Swallow the tablet whole with water or other liquids. Do not crush, break, or chew it.  ?? If this medicine does not help your headache at all, do not take more medicine. Call your doctor.  ?? If your headache comes back or you do not get complete relief, wait at least 2 hours before you use another dose. If you feel you need to use the medicine more than 2 times in a day, call your doctor.  ?? Read and follow the patient instructions that come with this medicine. Talk to your doctor or pharmacist if you have any questions.  ?? Use sumatriptan only when you have a migraine. This medicine is not used on a regular schedule.  ?? Store the medicine in a closed container at room temperature, away from heat, moisture, and direct light.  Drugs and Foods to Avoid:   Ask your doctor or pharmacist before using any other medicine, including over-the-counter medicines, vitamins, and herbal products.  ?? Do not use this medicine if you have taken another migraine headache medicine in the past 24 hours, such as another triptan or an ergot medicine. These medicines include almotriptan, dihydroergotamine, eletriptan, ergotamine, frovatriptan, methysergide, rizatriptan, or zolmitriptan.  ?? Do not use this medicine if you have taken an MAO inhibitor in the past 14 days.  ?? Tell your doctor if you are using medicine to treat depression.   Warnings While Using This Medicine:   ?? Tell your doctor if you are pregnant, or if you have kidney disease, liver disease, diabetes, high blood pressure, high cholesterol, or a history of seizures. Tell your doctor if you have a family history of heart disease, a history of blood circulation problems (such as peripheral vascular disease), or if you smoke.  ?? Do not breastfeed for at least 12 hours after you take this medicine.  ?? This medicine should be used only for classic or common migraine headaches. It will not work for any other kind of headache or pain.  ?? This medicine may cause the following problems:  ?? Higher risk for abnormal heart rhythm, heart attack, or stroke  ?? Tightness or discomfort in your chest, neck, or jaw  ?? Spasms in the blood vessels, including Raynaud syndrome  ?? Serotonin syndrome (more likely if used with medicine to treat depression)  ?? High blood pressure  ?? Your headaches may become worse if you use this medicine for 10 or more days per month. Keep a journal and write down how often your headaches occur and how often you take this medicine.  ?? This medicine may make you dizzy or drowsy. Do not drive or do anything else that could be dangerous until you know how this medicine affects you.  ?? Call your doctor if your symptoms do not improve or if they get worse.  ?? Keep all medicine out  of the reach of children. Never share your medicine with anyone.  Possible Side Effects While Using This Medicine:   Call your doctor right away if you notice any of these side effects:  ?? Allergic reaction: Itching or hives, swelling in your face or hands, swelling or tingling in your mouth or throat, chest tightness, trouble breathing  ?? Anxiety, restlessness, fever, sweating, muscle spasms, twitching, diarrhea, seeing or hearing things that are not there  ?? Chest pain, especially if it spreads to your arms, jaw, back, or neck, trouble breathing, unusual sweating, faintness   ?? Fast, pounding, or uneven heartbeat, dizziness  ?? Numbness, tingling, cramps, unexplained pain in your hands, arms, legs, or feet, color changes in your fingers or toes  ?? Seizure  ?? Severe stomach pain, bloody diarrhea, nausea, or vomiting  ?? Sudden severe headache (other than the one being treated)  ?? Tightness or discomfort in your chest, neck, or jaw  ?? Vision loss or vision changes that are not part of a usual migraine  If you notice other side effects that you think are caused by this medicine, tell your doctor.   Call your doctor for medical advice about side effects. You may report side effects to FDA at 1-800-FDA-1088  ?? 2017 [facility] Information is for End User's use only and may not be sold, redistributed or otherwise used for commercial purposes.  The above information is an educational aid only. It is not intended as medical advice for individual conditions or treatments. Talk to your doctor, nurse or pharmacist before following any medical regimen to see if it is safe and effective for you.  Ibuprofen (By mouth)   Ibuprofen (eye-bue-PROE-fen)  Treats pain and fever. This medicine is an NSAID.   Brand Name(s): Advil, Advil Children's, Advil Liqui-Gels, Advil Migraine, All-Purpose First Aid Kit, Children's Ibuprofen, Children's Motrin, Comfort Pac, Concentrated Motrin Infants' Drops, Equate Ibuprofen Junior Strength, Genpril, Good [facility]ighbor Ibuprofen Infants', [facility] Children's Ibuprofen, [facility] Ibuprofen, [facility] Ibuprofen Junior Strength   There may be other brand names for this medicine.  When This Medicine Should Not Be Used:   This medicine is not right for everyone. Do not use if you had an allergic reaction (including asthma) to ibuprofen, aspirin, or another NSAID, or right before or after heart surgery.  How to Use This Medicine:   Capsule, Liquid Filled Capsule, Suspension, Tablet, Chewable Tablet   ?? Your doctor will tell you how much medicine to use. Do not use more than directed.  ?? Prescription ibuprofen should come with a Medication Guide. Ask your pharmacist for the Medication Guide if you do not have one.  ?? Follow the instructions on the medicine label if you are using this medicine without a prescription.  ?? Take this medicine with food or milk if it upsets your stomach.  ?? Oral liquid: Shake well just before using. Measure with a marked measuring spoon, oral syringe, or medicine cup.  ?? Chewable tablet: Chew completely before you swallow it. Then drink some water to make sure you swallow all of the medicine.  ?? For Children: Ask your pharmacist if you are not sure how much medicine to give a child. The dose is usually based on weight, not age. Never give more medicine than directed.  ?? For Adults: Do not take more than 6 pills in 1 day (24 hours) unless your doctor tells you to.  ?? Missed dose: If you take this  medicine on a regular basis and miss a dose, take it as soon as you can. If it is almost time for your next dose, wait until then to use the medicine and skip the missed dose. Do not use extra medicine to make up for a missed dose.  ?? Store the medicine in a closed container at room temperature, away from heat, moisture, and direct light. Do not freeze the oral liquid.  Drugs and Foods to Avoid:   Ask your doctor or pharmacist before using any other medicine, including over-the-counter medicines, vitamins, and herbal products.  ?? Some foods and medicine can affect how ibuprofen works. Tell your doctor if you are also using lithium, methotrexate, a blood thinner (such as warfarin), a steroid medicine (such as hydrocortisone, prednisolone, prednisone), a diuretic (water pill), or an ACE inhibitor blood pressure medicine.  ?? Do not use any other NSAID medicine unless your doctor says it is okay. Some other NSAIDs are aspirin, diclofenac, naproxen, or celecoxib.   ?? Do not drink alcohol while you are using this medicine.  Warnings While Using This Medicine:   ?? Tell your doctor if you are pregnant or breastfeeding. Do not use this medicine during the later part of pregnancy.  ?? Tell your doctor if you have kidney disease, liver disease, asthma, lupus or a similar connective tissue disease, or a history of ulcers or other digestion problems. Tell your doctor if you smoke or have heart or blood circulation problems, including high blood pressure, heart failure (CHF), or bleeding problems.  ?? This medicine may cause the following problems:  ?? Bleeding and ulcers in the stomach or intestines  ?? Higher risk of heart attack or stroke  ?? Liver damage  ?? Kidney damage  ?? Vision problems  ?? Call your doctor if symptoms get worse, pain lasts more than 10 days, or fever lasts more than 3 days.  ?? This medicine might contain sugar or phenylalanine (aspartame).  ?? Tell any doctor or dentist who treats you that you are using this medicine.  ?? Keep all medicine out of the reach of children. Never share your medicine with anyone.  Possible Side Effects While Using This Medicine:   Call your doctor right away if you notice any of these side effects:  ?? Allergic reaction: Itching or hives, swelling in your face or hands, swelling or tingling in your mouth or throat, chest tightness, trouble breathing  ?? Blistering, peeling, or red skin rash  ?? Change in how much or how often you urinate  ?? Chest pain that may spread to your arms, jaw, back, or neck, trouble breathing, nausea, unusual sweating, faintness  ?? Chest pain, trouble breathing, weakness on one side of your body, severe headache, trouble seeing or talking, pain in your lower leg  ?? Dark urine or pale stools, nausea, vomiting, loss of appetite, stomach pain, yellow skin or eyes  ?? Fever, neck pain, stiff neck  ?? Severe stomach pain, vomiting blood, bloody or black, tarry stools   ?? Swelling in your hands, ankles, or feet, rapid weight gain  ?? Trouble seeing, blind spots, change in how you see colors  ?? Unusual bleeding, bruising, or weakness  If you notice these less serious side effects, talk with your doctor:   ?? Constipation, diarrhea, gas, mild upset stomach  ?? Dizziness, headache, ringing in the ears  If you notice other side effects that you think are caused by this medicine, tell your doctor.   Call  your doctor for medical advice about side effects. You may report side effects to FDA at 1-800-FDA-1088  ?? 2017 [facility] Information is for End User's use only and may not be sold, redistributed or otherwise used for commercial purposes.  The above information is an educational aid only. It is not intended as medical advice for individual conditions or treatments. Talk to your doctor, nurse or pharmacist before following any medical regimen to see if it is safe and effective for you.       Recurring Migraine Headache: Care Instructions  Your Care Instructions  Migraines are painful, throbbing headaches. They often start on one side of the head. They may cause nausea and vomiting and make you sensitive to light, sound, or smell. Some people may have only a few migraines throughout life. Others have them as often as several times a month.  The goal of treatment is to reduce the number of migraines you have and relieve your symptoms. Even with treatment, you may continue to have migraines. You play an important role in dealing with your headaches. Work on avoiding things that seem to trigger your migraines. When you feel a headache coming on, act quickly to stop it before it gets worse.  Follow-up care is a key part of your treatment and safety. Be sure to make and go to all appointments, and call your doctor if you are having problems. It's also a good idea to know your test results and keep a list of the medicines you take.  How can you care for yourself at home?   ?? Do not drive if you have taken a prescription pain medicine.  ?? Rest in a quiet, dark room until your headache is gone. Close your eyes and try to relax or go to sleep. Do not watch TV or read.  ?? Put a cold, moist cloth or cold pack on the painful area for 10 to 20 minutes at a time. Put a thin cloth between the cold pack and your skin.  ?? Have someone gently massage your neck and shoulders.  ?? Take your medicines exactly as prescribed. Call your doctor if you think you are having a problem with your medicine. You will get more details on the specific medicines your doctor prescribes.  ?? Don't take medicine for headache pain too often. Talk to your doctor if you are taking medicine more than 2 days a week to stop a headache. Taking too much pain medicine can lead to more headaches. These are called medicine-overuse headaches.  To prevent migraines  ?? Keep a headache diary so you can figure out what triggers your headaches. Avoiding triggers may help you prevent headaches. Record when each headache began, how long it lasted, and what the pain was like. Write down any other symptoms you had with the headache. These may include nausea, flashing lights or dark spots, or sensitivity to bright light or loud noise. Note if the headache occurred near your period. List anything that might have triggered the headache. Triggers may include certain foods (chocolate, cheese, wine) or odors, smoke, bright light, stress, or lack of sleep.  ?? If your doctor has prescribed medicine for your migraines, take it as directed. You may have medicine that you take only when you get a migraine and medicine that you take all the time to help prevent migraines.  ? If your doctor has prescribed medicine for when you get a headache, take it at the first sign of  a migraine, unless your doctor has given you other instructions.  ? If your doctor has prescribed medicine to prevent migraines, take it  exactly as prescribed. Call your doctor if you think you are having a problem with your medicine.  ?? Find healthy ways to deal with stress. Migraines are most common during or right after stressful times. Try finding ways to reduce stress like practicing mindfulness or deep breathing exercises.  ?? Get regular sleep and exercise. But be careful to not push yourself too hard during exercise. It may trigger a headache.  ?? Eat regular meals, and avoid foods and drinks that often trigger migraines. These include chocolate and alcohol, especially red wine and port. Chemicals used in food, such as aspartame and monosodium glutamate (MSG), also can trigger migraines. So can some food additives, such as those found in hot dogs, bacon, cold cuts, aged cheeses, and pickled foods.  ?? Limit caffeine by not drinking too much coffee, tea, or soda. Do not quit caffeine suddenly, because that can also give you migraines.  ?? Do not smoke or allow others to smoke around you. If you need help quitting, talk to your doctor about stop-smoking programs and medicines. These can increase your chances of quitting for good.  ?? If you are taking birth control pills or hormone therapy, talk to your doctor about whether they are triggering your migraines.  When should you call for help?   [id number] anytime you think you may need emergency care. For example, call if:  ?? You have symptoms of a stroke. These may include:  ? Sudden numbness, tingling, weakness, or loss of movement in your face, arm, or leg, especially on only one side of your body.  ? Sudden vision changes.  ? Sudden trouble speaking.  ? Sudden confusion or trouble understanding simple statements.  ? Sudden problems with walking or balance.  ? A sudden, severe headache that is different from past headaches.  Call your doctor now or seek immediate medical care if:  ?? You develop a fever and a stiff neck.  ?? You have new nausea and vomiting, or you cannot keep down food or liquids.   Watch closely for changes in your health, and be sure to contact your doctor if:  ?? You have a headache that does not get better within 1 or 2 days.  ?? Your headaches get worse or happen more often.  Where can you learn more?  Go to [employer]  Enter V975 in the search box to learn more about "Recurring Migraine Headache: Care Instructions."  Current as of: December 19, 2017??????????????????????????????Content Version: 12.5  ?? 2006-2020 [employer].   Care instructions adapted under license by [employer] (which disclaims liability or warranty for this information). If you have questions about a medical condition or this instruction, always ask your healthcare professional. [place] any warranty or liability for your use of this information.

## 2018-08-29 LAB — NOVEL CORONAVIRUS (COVID-19): SARS-CoV-2, NAA: NOT DETECTED

## 2018-08-29 LAB — COVID-19: SARS-CoV-2, NAA: NOT DETECTED

## 2018-09-25 ENCOUNTER — Other Ambulatory Visit: Payer: Self-pay | Admitting: Family Medicine

## 2018-09-25 ENCOUNTER — Encounter

## 2018-09-30 ENCOUNTER — Ambulatory Visit: Admit: 2018-09-30 | Payer: PRIVATE HEALTH INSURANCE | Attending: Clinical Neuropsychologist | Primary: Internal Medicine

## 2018-09-30 ENCOUNTER — Ambulatory Visit: Attending: Clinical Neuropsychologist | Primary: Internal Medicine

## 2018-09-30 DIAGNOSIS — F411 Generalized anxiety disorder: Secondary | ICD-10-CM
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
## 2018-10-02 MED ORDER — ESCITALOPRAM 20 MG TAB
20 mg | ORAL_TABLET | Freq: Every day | ORAL | 1 refills | Status: DC
Start: 2018-10-02 — End: 2019-06-09

## 2018-10-23 ENCOUNTER — Ambulatory Visit: Admit: 2018-10-23 | Discharge: 2018-10-23 | Payer: PRIVATE HEALTH INSURANCE | Primary: Internal Medicine

## 2018-10-23 ENCOUNTER — Ambulatory Visit: Primary: Internal Medicine

## 2018-10-23 DIAGNOSIS — Z20822 Contact with and (suspected) exposure to covid-19: Secondary | ICD-10-CM

## 2018-10-23 DIAGNOSIS — Z20828 Contact with and (suspected) exposure to other viral communicable diseases: Secondary | ICD-10-CM

## 2018-10-23 NOTE — Progress Notes (Signed)
This patient was seen in Flu Clinic at [facility] while in their vehicle due to COVID-19 pandemic with PPE and focused examination in order to decrease community viral transmission.     The patient/guardian gave verbal consent to treat.    The history is provided by the patient.   Cough   The history is provided by the patient. This is a new problem. The current episode started 2 days ago. The problem occurs every few minutes. The problem has not changed since onset.The cough is non-productive. There has been no fever. Associated symptoms include sore throat, shortness of breath and nausea. She has tried nothing for the symptoms. Risk factors: exposure to covid. Her past medical history does not include asthma.        Past Medical History:   Diagnosis Date   ??? Anxiety    ??? PCOS (polycystic ovarian syndrome)    ??? Sciatica         Past Surgical History:   Procedure Laterality Date   ??? BREAST SURGERY PROCEDURE UNLISTED      r breast cyst removal   ??? HX ORTHOPAEDIC           Family History   Problem Relation Age of Onset   ??? Cancer Maternal Aunt         breast   ??? Diabetes Maternal Grandmother    ??? Cancer Maternal Grandmother         breast   ??? Breast Cancer Maternal Grandmother         Social History     Socioeconomic History   ??? Marital status: DIVORCED     Spouse name: Not on file   ??? Number of children: Not on file   ??? Years of education: Not on file   ??? Highest education level: Not on file   Occupational History   ??? Not on file   Social Needs   ??? Financial resource strain: Not on file   ??? Food insecurity     Worry: Not on file     Inability: Not on file   ??? Transportation needs     Medical: Not on file     Non-medical: Not on file   Tobacco Use   ??? Smoking status: Never Smoker   ??? Smokeless tobacco: Never Used   Substance and Sexual Activity   ??? Alcohol use: Yes   ??? Drug use: Not on file   ??? Sexual activity: Not on file   Lifestyle   ??? Physical activity     Days per week: Not on file      Minutes per session: Not on file   ??? Stress: Not on file   Relationships   ??? Social Product manager on phone: Not on file     Gets together: Not on file     Attends religious service: Not on file     Active member of club or organization: Not on file     Attends meetings of clubs or organizations: Not on file     Relationship status: Not on file   ??? Intimate partner violence     Fear of current or ex partner: Not on file     Emotionally abused: Not on file     Physically abused: Not on file     Forced sexual activity: Not on file   Other Topics Concern   ??? Not on file   Social History Narrative   ???  Not on file                ALLERGIES: Strawberry and Augmentin [amoxicillin-pot clavulanate]    Review of Systems   HENT: Positive for congestion and sore throat.    Respiratory: Positive for cough and shortness of breath.    Gastrointestinal: Positive for nausea.   All other systems reviewed and are negative.      Vitals:    10/23/18 1314   Pulse: 88   Resp: 16   Temp: 98.8 ??F (37.1 ??C)   SpO2: 96%       Physical Exam  Vitals signs and nursing note reviewed.   Constitutional:       General: She is not in acute distress.     Appearance: She is not ill-appearing.   Pulmonary:      Effort: Pulmonary effort is normal. No respiratory distress.      Breath sounds: No wheezing.         MDM    Procedures        ICD-10-CM ICD-9-CM    1. Exposure to COVID-19 virus  Z20.828 V01.79 NOVEL CORONAVIRUS (COVID-19)      Tested for Covid-19 and advised to quarantine until result comes back.      No orders of the defined types were placed in this encounter.    No results found for any visits on 10/23/18.  The patients condition was discussed with the patient and they understand.  The patient is to follow up with primary care doctor.  If signs and symptoms become worse the pt is to go to the ER. The patient is to take medications as prescribed.

## 2018-10-25 LAB — NOVEL CORONAVIRUS (COVID-19): SARS-CoV-2, NAA: NOT DETECTED

## 2018-10-25 LAB — COVID-19: SARS-CoV-2, NAA: NOT DETECTED

## 2018-11-18 ENCOUNTER — Ambulatory Visit: Admit: 2018-11-18 | Payer: BLUE CROSS/BLUE SHIELD | Attending: Clinical Neuropsychologist | Primary: Internal Medicine

## 2018-11-18 ENCOUNTER — Ambulatory Visit: Attending: Clinical Neuropsychologist | Primary: Internal Medicine

## 2018-11-18 DIAGNOSIS — F32A Depression, unspecified: Secondary | ICD-10-CM

## 2018-11-18 DIAGNOSIS — F419 Anxiety disorder, unspecified: Secondary | ICD-10-CM
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
## 2018-11-21 ENCOUNTER — Encounter: Payer: Self-pay | Admitting: Family Medicine

## 2018-12-08 ENCOUNTER — Telehealth: Payer: Managed Care, Other (non HMO) | Admitting: Family

## 2018-12-08 DIAGNOSIS — B373 Candidiasis of vulva and vagina: Secondary | ICD-10-CM

## 2018-12-08 DIAGNOSIS — B3731 Acute candidiasis of vulva and vagina: Secondary | ICD-10-CM

## 2018-12-08 MED ORDER — FLUCONAZOLE 150 MG PO TABS: 150.0000 mg | ORAL_TABLET | ORAL | 0 refills | Status: DC | PRN

## 2018-12-08 NOTE — Progress Notes (Signed)

## 2018-12-24 ENCOUNTER — Ambulatory Visit: Admit: 2018-12-24 | Payer: BLUE CROSS/BLUE SHIELD | Attending: Clinical Neuropsychologist | Primary: Internal Medicine

## 2018-12-24 ENCOUNTER — Ambulatory Visit: Attending: Clinical Neuropsychologist | Primary: Internal Medicine

## 2018-12-24 DIAGNOSIS — F32A Depression, unspecified: Secondary | ICD-10-CM

## 2018-12-24 DIAGNOSIS — F419 Anxiety disorder, unspecified: Secondary | ICD-10-CM
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
## 2018-12-24 NOTE — Progress Notes (Signed)
Greater than 5 minutes, yet less than 10 minutes of time have been spent researching, coordinating, and implementing care for this patient today.  Thank you for the details you included in the comment boxes. Those details are very helpful in determining the best course of treatment for you and help us to provide the best care.  
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
## 2019-01-01 ENCOUNTER — Telehealth
Admit: 2019-01-01 | Discharge: 2019-01-01 | Payer: BLUE CROSS/BLUE SHIELD | Attending: Internal Medicine | Primary: Internal Medicine

## 2019-01-01 ENCOUNTER — Telehealth: Attending: Internal Medicine | Primary: Internal Medicine

## 2019-01-01 DIAGNOSIS — R0602 Shortness of breath: Secondary | ICD-10-CM

## 2019-01-01 MED ORDER — AMPHETAMINE-DEXTROAMPHETAMINE 5 MG TAB
5 mg | ORAL_TABLET | Freq: Two times a day (BID) | ORAL | 0 refills | Status: DC
Start: 2019-01-01 — End: 2019-04-11

## 2019-01-01 NOTE — Progress Notes (Signed)
Lauren Rocha is a 38 y.o. female who was seen by synchronous (real-time) audio-video technology on 01/01/2019 for No chief complaint on file.        Assessment & Plan:   Diagnoses and all orders for this visit:    1. Shortness of breath  -     REFERRAL TO PULMONARY DISEASE  -     XR CHEST PA LAT; Future    2. Attention deficit hyperactivity disorder (ADHD), other type  -     dextroamphetamine-amphetamine (ADDERALL) 5 mg tablet; Take 1 Tab by mouth two (2) times a day. Max Daily Amount: 10 mg.  -     AMB POC EKG ROUTINE W/ 12 LEADS, INTER & REP    3. Other depression  -     REFERRAL TO BEHAVIORAL HEALTH  -     REFERRAL TO PSYCHIATRY    4. Anxiety  -     REFERRAL TO BEHAVIORAL HEALTH  -     REFERRAL TO PSYCHIATRY      Will need EKG and BP reading prior to starting meds  For ADHD. Patient will set up a nurses visit to get this done      Start adderrall 5 mg bid      Subjective:   Has seen Dr. Khawajah neuropsych- his most recent eval was reviewed at ov today    Testing was positive for ADHD  Possible bipolar, narcissistic, borderline  neuropsych Advised  Patient to obtain psychiatrist and therapist  Patient for now continues on lexapro 20 mg  Takes ativan as needed  She denies any suicidal or homicidal ideation    No cardiac hx, no cp,  or palpitations.    She does report persistence of sob which has been chronic              Prior to Admission medications    Medication Sig Start Date End Date Taking? Authorizing Provider   escitalopram oxalate (LEXAPRO) 20 mg tablet Take 1 Tab by mouth daily. 10/01/18   Latham, Cartier Mapel B, MD   ibuprofen (MOTRIN) 800 mg tablet Take 1 Tab by mouth every eight (8) hours as needed for Pain. Take with food or milk; report use over 3x a week 08/27/18   Belle, Cheryl, MD   cholecalciferol (VITAMIN D3) (50,000 UNITS /1250 MCG) capsule Take 1 Cap by mouth every seven (7) days. Indications: low vitamin D levels 04/02/18   Latham, Arasely Akkerman B, MD   LORazepam (ATIVAN) 0.5 mg tablet Take 0.5 mg by mouth.  06/05/17   Provider, Historical   metFORMIN ER (GLUCOPHAGE XR) 500 mg tablet Take 2 tabs po daily  Patient taking differently: Take 750 mg by mouth daily (with dinner). Take 2 tabs po daily 02/18/18   Latham, Nykeria Mealing B, MD   levonorgestrel-ethinyl estradiol (KURVELO, 28,) 0.15-0.03 mg tab Take 1 Tab by mouth daily. 02/18/18   Latham, Kimmy Parish B, MD   cetirizine (ZYRTEC) 10 mg tablet TAKE 1 TABLET BY MOUTH EVERY DAY 02/18/18   Latham, Cynai Skeens B, MD         Review of Systems   Constitutional: Negative for weight loss.   Eyes: Negative for blurred vision.   Respiratory: Negative for shortness of breath.    Cardiovascular: Negative for chest pain and leg swelling.   Genitourinary: Negative for frequency and urgency.   Musculoskeletal: Negative for joint pain.   Neurological: Negative for headaches.       Objective:   No flowsheet data found.     [  INSTRUCTIONS:  "[x] " Indicates a positive item  "[] " Indicates a negative item  -- DELETE ALL ITEMS NOT EXAMINED]    Constitutional: [x]  Appears well-developed and well-nourished [x]  No apparent distress      []  Abnormal -     Mental status: [x]  Alert and awake  [x]  Oriented to person/place/time [x]  Able to follow commands    []  Abnormal -     Eyes:   EOM    [x]   Normal    []  Abnormal -   Sclera  [x]   Normal    []  Abnormal -          Discharge [x]   None visible   []  Abnormal -     HENT: [x]  Normocephalic, atraumatic  []  Abnormal -   [x]  Mouth/Throat: Mucous membranes are moist    External Ears [x]  Normal  []  Abnormal -    Neck: [x]  No visualized mass []  Abnormal -     Pulmonary/Chest: [x]  Respiratory effort normal   [x]  No visualized signs of difficulty breathing or respiratory distress        []  Abnormal -      Musculoskeletal:   [x]  Normal gait with no signs of ataxia         [x]  Normal range of motion of neck        []  Abnormal -     Neurological:        [x]  No Facial Asymmetry (Cranial nerve 7 motor function) (limited exam due to video visit)          [x]  No gaze palsy        []   Abnormal -          Skin:        [x]  No significant exanthematous lesions or discoloration noted on facial skin         []  Abnormal -            Psychiatric:       [x]  Normal Affect []  Abnormal -        [x]  No Hallucinations    Other pertinent observable physical exam findings:-        We discussed the expected course, resolution and complications of the diagnosis(es) in detail.  Medication risks, benefits, costs, interactions, and alternatives were discussed as indicated.  I advised her to contact the office if her condition worsens, changes or fails to improve as anticipated. She expressed understanding with the diagnosis(es) and plan.       Lauren Rocha, who was evaluated through a patient-initiated, synchronous (real-time) audio-video encounter, and/or her healthcare decision maker, is aware that it is a billable service, with coverage as determined by her insurance carrier. She provided verbal consent to proceed: Yes, and patient identification was verified. It was conducted pursuant to the emergency declaration under the [place] waiver authority and the [employer] Act. A caregiver was present when appropriate. Ability to conduct physical exam was limited. I was at home. The patient was at home.      Darlys Gales, MD

## 2019-01-01 NOTE — Progress Notes (Signed)
Lauren Rocha is a 38 y.o. female who was seen by synchronous (real-time) audio-video technology on 01/01/2019 for No chief complaint on file.        Assessment & Plan:   Diagnoses and all orders for this visit:    1. Shortness of breath  -     REFERRAL TO PULMONARY DISEASE  -     XR CHEST PA LAT; Future    2. Attention deficit hyperactivity disorder (ADHD), other type  -     dextroamphetamine-amphetamine (ADDERALL) 5 mg tablet; Take 1 Tab by mouth two (2) times a day. Max Daily Amount: 10 mg.  -     AMB POC EKG ROUTINE W/ 12 LEADS, INTER & REP    3. Other depression  -     REFERRAL TO BEHAVIORAL HEALTH  -     REFERRAL TO PSYCHIATRY    4. Anxiety  -     REFERRAL TO BEHAVIORAL HEALTH  -     REFERRAL TO PSYCHIATRY      Will need EKG and BP reading prior to starting meds  For ADHD. Patient will set up a nurses visit to get this done      Start adderrall 5 mg bid      Subjective:   Has seen Dr. Rowe Pavy neuropsych- his most recent eval was reviewed at [facility] today    Testing was positive for ADHD  Possible bipolar, narcissistic, borderline  neuropsych Advised  Patient to obtain psychiatrist and therapist  Patient for now continues on lexapro 20 mg  Takes ativan as needed  She denies any suicidal or homicidal ideation    No cardiac hx, no cp,  or palpitations.    She does report persistence of sob which has been chronic              Prior to Admission medications    Medication Sig Start Date End Date Taking? Authorizing Provider   escitalopram oxalate (LEXAPRO) 20 mg tablet Take 1 Tab by mouth daily. 10/01/18   Deno Etienne, MD   ibuprofen (MOTRIN) 800 mg tablet Take 1 Tab by mouth every eight (8) hours as needed for Pain. Take with food or milk; report use over 3x a week 08/27/18   Mallie Snooks, MD   cholecalciferol (VITAMIN D3) (50,000 UNITS /1250 MCG) capsule Take 1 Cap by mouth every seven (7) days. Indications: low vitamin D levels 04/02/18   Deno Etienne, MD   LORazepam (ATIVAN) 0.5 mg tablet Take 0.5 mg by mouth.  06/05/17   Provider, Historical   metFORMIN ER (GLUCOPHAGE XR) 500 mg tablet Take 2 tabs po daily  Patient taking differently: Take 750 mg by mouth daily (with dinner). Take 2 tabs po daily 02/18/18   Deno Etienne, MD   levonorgestrel-ethinyl estradiol (KURVELO, 28,) 0.15-0.03 mg tab Take 1 Tab by mouth daily. 02/18/18   Deno Etienne, MD   cetirizine (ZYRTEC) 10 mg tablet TAKE 1 TABLET BY MOUTH EVERY DAY 02/18/18   Deno Etienne, MD         Review of Systems   Constitutional: Negative for weight loss.   Eyes: Negative for blurred vision.   Respiratory: Negative for shortness of breath.    Cardiovascular: Negative for chest pain and leg swelling.   Genitourinary: Negative for frequency and urgency.   Musculoskeletal: Negative for joint pain.   Neurological: Negative for headaches.       Objective:   No flowsheet data found.     [  INSTRUCTIONS:  "[x] " Indicates a positive item  "[] " Indicates a negative item  -- DELETE ALL ITEMS NOT EXAMINED]    Constitutional: [x]  Appears well-developed and well-nourished [x]  No apparent distress      []  Abnormal -     Mental status: [x]  Alert and awake  [x]  Oriented to person/place/time [x]  Able to follow commands    []  Abnormal -     Eyes:   EOM    [x]   Normal    []  Abnormal -   Sclera  [x]   Normal    []  Abnormal -          Discharge [x]   None visible   []  Abnormal -     HENT: [x]  Normocephalic, atraumatic  []  Abnormal -   [x]  Mouth/Throat: Mucous membranes are moist    External Ears [x]  Normal  []  Abnormal -    Neck: [x]  No visualized mass []  Abnormal -     Pulmonary/Chest: [x]  Respiratory effort normal   [x]  No visualized signs of difficulty breathing or respiratory distress        []  Abnormal -      Musculoskeletal:   [x]  Normal gait with no signs of ataxia         [x]  Normal range of motion of neck        []  Abnormal -     Neurological:        [x]  No Facial Asymmetry (Cranial nerve 7 motor function) (limited exam due to video visit)          [x]  No gaze palsy        []   Abnormal -          Skin:        [x]  No significant exanthematous lesions or discoloration noted on facial skin         []  Abnormal -            Psychiatric:       [x]  Normal Affect []  Abnormal -        [x]  No Hallucinations    Other pertinent observable physical exam findings:-        We discussed the expected course, resolution and complications of the diagnosis(es) in detail.  Medication risks, benefits, costs, interactions, and alternatives were discussed as indicated.  I advised her to contact the office if her condition worsens, changes or fails to improve as anticipated. She expressed understanding with the diagnosis(es) and plan.       Lauren Rocha, who was evaluated through a patient-initiated, synchronous (real-time) audio-video encounter, and/or her healthcare decision maker, is aware that it is a billable service, with coverage as determined by her insurance carrier. She provided verbal consent to proceed: Yes, and patient identification was verified. It was conducted pursuant to the emergency declaration under the [place] waiver authority and the [employer] Act. A caregiver was present when appropriate. Ability to conduct physical exam was limited. I was at home. The patient was at home.      Darlys Gales, MD

## 2019-01-02 ENCOUNTER — Encounter: Admit: 2019-01-02 | Payer: BLUE CROSS/BLUE SHIELD | Primary: Internal Medicine

## 2019-01-02 ENCOUNTER — Institutional Professional Consult (permissible substitution): Admit: 2019-01-02 | Discharge: 2019-01-02 | Payer: BLUE CROSS/BLUE SHIELD | Primary: Internal Medicine

## 2019-01-02 DIAGNOSIS — R0602 Shortness of breath: Secondary | ICD-10-CM

## 2019-01-02 NOTE — Progress Notes (Signed)
Chest xray is normal

## 2019-02-05 ENCOUNTER — Telehealth
Admit: 2019-02-05 | Discharge: 2019-02-05 | Payer: BLUE CROSS/BLUE SHIELD | Attending: Internal Medicine | Primary: Internal Medicine

## 2019-02-05 ENCOUNTER — Telehealth: Attending: Internal Medicine | Primary: Internal Medicine

## 2019-02-05 DIAGNOSIS — U071 COVID-19: Secondary | ICD-10-CM

## 2019-02-05 NOTE — Progress Notes (Signed)
Assessment/Plan:       ICD-10-CM ICD-9-CM    1. COVID-19  U07.1 079.89    patient has been advised to continue rest and supportive care  Continue Vit C and add Vit D  I have advised patient that she needs to be examined and evaluated in person due to the pain in her back. I would recommend chest xray and EKG at minimum. Will check to see if she can see Dr. Leotis Pain for ov. Patient does not have evidence of negative test thus she may have to go to acute care or ER for further evaluation  I will write note for her to stay out of work from 02/05/19 and return to work 02/17/19 due to persistent symptoms       Advised her to call back or return to office if symptoms worsen/change/persist.  Discussed expected course/resolution/complications of diagnosis in detail with patient.    Medication risks/benefits/costs/interactions/alternatives discussed with patient.  She was given an after visit summary which includes diagnoses, current medications, & vitals.  She expressed understanding with the diagnosis and plan.        Subjective:   Patient says she ws diagnosed on December 11 with COVID-19  She was not hospitalized. She had a lot of nasal congestion, high fever, body aches, headaches, lost taste and smell, persistent cough some increased sob, gets very fatigued.  Quarantine  For her ended Dec 24 however she has persisted with some of the symptoms. She also says she has a pain in her upper mid back which has been present since she was first diagnosed. She says it has not gotten worse.no worse with deep breath and does not radiate.    She is taking mucinex and tylenol, vit c         Prior to Admission medications    Medication Sig Start Date End Date Taking? Authorizing Provider   dextroamphetamine-amphetamine (ADDERALL) 5 mg tablet Take 1 Tab by mouth two (2) times a day. Max Daily Amount: 10 mg. 01/01/19   Darlys Gales, MD   escitalopram oxalate (LEXAPRO) 20 mg tablet Take 1 Tab by mouth daily. 10/01/18   Darlys Gales,  MD   ibuprofen (MOTRIN) 800 mg tablet Take 1 Tab by mouth every eight (8) hours as needed for Pain. Take with food or milk; report use over 3x a week 08/27/18   Lesli Albee, MD   cholecalciferol (VITAMIN D3) (50,000 UNITS /1250 MCG) capsule Take 1 Cap by mouth every seven (7) days. Indications: low vitamin D levels 04/02/18   Darlys Gales, MD   LORazepam (ATIVAN) 0.5 mg tablet Take 0.5 mg by mouth. 06/05/17   Provider, Historical   metFORMIN ER (GLUCOPHAGE XR) 500 mg tablet Take 2 tabs po daily  Patient taking differently: Take 750 mg by mouth daily (with dinner). Take 2 tabs po daily 02/18/18   Darlys Gales, MD   levonorgestrel-ethinyl estradiol (KURVELO, 28,) 0.15-0.03 mg tab Take 1 Tab by mouth daily. 02/18/18   Darlys Gales, MD   cetirizine (ZYRTEC) 10 mg tablet TAKE 1 TABLET BY MOUTH EVERY DAY 02/18/18   Darlys Gales, MD         Review of Systems   Constitutional: Positive for malaise/fatigue.   Respiratory: Positive for cough.        Objective:     Patient-Reported Vitals 01/02/2019   Patient-Reported Weight 225.1lb   Patient-Reported Pulse 86   Patient-Reported Temperature 98.4   Patient-Reported SpO2 98  Patient-Reported Systolic  121   Patient-Reported Diastolic 79        [INSTRUCTIONS:  "[x] " Indicates a positive item  "[] " Indicates a negative item  -- DELETE ALL ITEMS NOT EXAMINED]    Constitutional: [x]  Appears well-developed and well-nourished [x]  No apparent distress      []  Abnormal -     Mental status: [x]  Alert and awake  [x]  Oriented to person/place/time [x]  Able to follow commands    []  Abnormal -     Eyes:   EOM    [x]   Normal    []  Abnormal -   Sclera  [x]   Normal    []  Abnormal -          Discharge [x]   None visible   []  Abnormal -     HENT: [x]  Normocephalic, atraumatic  []  Abnormal -   [x]  Mouth/Throat: Mucous membranes are moist    External Ears [x]  Normal  []  Abnormal -    Neck: [x]  No visualized mass []  Abnormal -     Pulmonary/Chest: [x]  Respiratory effort normal   [x]  No  visualized signs of difficulty breathing or respiratory distress        []  Abnormal -      Musculoskeletal:   [x]  Normal gait with no signs of ataxia         [x]  Normal range of motion of neck        []  Abnormal -     Neurological:        [x]  No Facial Asymmetry (Cranial nerve 7 motor function) (limited exam due to video visit)          [x]  No gaze palsy        []  Abnormal -          Skin:        [x]  No significant exanthematous lesions or discoloration noted on facial skin         []  Abnormal -            Psychiatric:       [x]  Normal Affect []  Abnormal -        [x]  No Hallucinations    Other pertinent observable physical exam findings:-        We discussed the expected course, resolution and complications of the diagnosis(es) in detail.  Medication risks, benefits, costs, interactions, and alternatives were discussed as indicated.  I advised her to contact the office if her condition worsens, changes or fails to improve as anticipated. She expressed understanding with the diagnosis(es) and plan.       Lauren Rocha, who was evaluated through a patient-initiated, synchronous (real-time) audio-video encounter, and/or her healthcare decision maker, is aware that it is a billable service, with coverage as determined by her insurance carrier. She provided verbal consent to proceed: Yes, and patient identification was verified. It was conducted pursuant to the emergency declaration under the and the , 1135 waiver authority and the and Act. A caregiver was present when appropriate. Ability to conduct physical exam was limited. I was at home. The patient was at home.      , MD

## 2019-02-05 NOTE — Progress Notes (Signed)
Assessment/Plan:       ICD-10-CM ICD-9-CM    1. COVID-19  U07.1 079.89    patient has been advised to continue rest and supportive care  Continue Vit C and add Vit D  I have advised patient that she needs to be examined and evaluated in person due to the pain in her back. I would recommend chest xray and EKG at minimum. Will check to see if she can see Dr. Belle for ov. Patient does not have evidence of negative test thus she may have to go to acute care or ER for further evaluation  I will write note for her to stay out of work from 02/05/19 and return to work 02/17/19 due to persistent symptoms       Advised her to call back or return to office if symptoms worsen/change/persist.  Discussed expected course/resolution/complications of diagnosis in detail with patient.    Medication risks/benefits/costs/interactions/alternatives discussed with patient.  She was given an after visit summary which includes diagnoses, current medications, & vitals.  She expressed understanding with the diagnosis and plan.        Subjective:   Patient says she ws diagnosed on December 11 with COVID-19  She was not hospitalized. She had a lot of nasal congestion, high fever, body aches, headaches, lost taste and smell, persistent cough some increased sob, gets very fatigued.  Quarantine  For her ended Dec 24 however she has persisted with some of the symptoms. She also says she has a pain in her upper mid back which has been present since she was first diagnosed. She says it has not gotten worse.no worse with deep breath and does not radiate.    She is taking mucinex and tylenol, vit c         Prior to Admission medications    Medication Sig Start Date End Date Taking? Authorizing Provider   dextroamphetamine-amphetamine (ADDERALL) 5 mg tablet Take 1 Tab by mouth two (2) times a day. Max Daily Amount: 10 mg. 01/01/19   Latham, Laurence Folz B, MD   escitalopram oxalate (LEXAPRO) 20 mg tablet Take 1 Tab by mouth daily. 10/01/18   Latham, Arvin Abello B,  MD   ibuprofen (MOTRIN) 800 mg tablet Take 1 Tab by mouth every eight (8) hours as needed for Pain. Take with food or milk; report use over 3x a week 08/27/18   Belle, Cheryl, MD   cholecalciferol (VITAMIN D3) (50,000 UNITS /1250 MCG) capsule Take 1 Cap by mouth every seven (7) days. Indications: low vitamin D levels 04/02/18   Latham, Gudelia Eugene B, MD   LORazepam (ATIVAN) 0.5 mg tablet Take 0.5 mg by mouth. 06/05/17   Provider, Historical   metFORMIN ER (GLUCOPHAGE XR) 500 mg tablet Take 2 tabs po daily  Patient taking differently: Take 750 mg by mouth daily (with dinner). Take 2 tabs po daily 02/18/18   Latham, Julie Nay B, MD   levonorgestrel-ethinyl estradiol (KURVELO, 28,) 0.15-0.03 mg tab Take 1 Tab by mouth daily. 02/18/18   Latham, Rosene Pilling B, MD   cetirizine (ZYRTEC) 10 mg tablet TAKE 1 TABLET BY MOUTH EVERY DAY 02/18/18   Latham, Durant Scibilia B, MD         Review of Systems   Constitutional: Positive for malaise/fatigue.   Respiratory: Positive for cough.        Objective:     Patient-Reported Vitals 01/02/2019   Patient-Reported Weight 225.1lb   Patient-Reported Pulse 86   Patient-Reported Temperature 98.4   Patient-Reported SpO2 98     Patient-Reported Systolic  121   Patient-Reported Diastolic 79        [INSTRUCTIONS:  "[x] " Indicates a positive item  "[] " Indicates a negative item  -- DELETE ALL ITEMS NOT EXAMINED]    Constitutional: [x]  Appears well-developed and well-nourished [x]  No apparent distress      []  Abnormal -     Mental status: [x]  Alert and awake  [x]  Oriented to person/place/time [x]  Able to follow commands    []  Abnormal -     Eyes:   EOM    [x]   Normal    []  Abnormal -   Sclera  [x]   Normal    []  Abnormal -          Discharge [x]   None visible   []  Abnormal -     HENT: [x]  Normocephalic, atraumatic  []  Abnormal -   [x]  Mouth/Throat: Mucous membranes are moist    External Ears [x]  Normal  []  Abnormal -    Neck: [x]  No visualized mass []  Abnormal -     Pulmonary/Chest: [x]  Respiratory effort normal   [x]  No  visualized signs of difficulty breathing or respiratory distress        []  Abnormal -      Musculoskeletal:   [x]  Normal gait with no signs of ataxia         [x]  Normal range of motion of neck        []  Abnormal -     Neurological:        [x]  No Facial Asymmetry (Cranial nerve 7 motor function) (limited exam due to video visit)          [x]  No gaze palsy        []  Abnormal -          Skin:        [x]  No significant exanthematous lesions or discoloration noted on facial skin         []  Abnormal -            Psychiatric:       [x]  Normal Affect []  Abnormal -        [x]  No Hallucinations    Other pertinent observable physical exam findings:-        We discussed the expected course, resolution and complications of the diagnosis(es) in detail.  Medication risks, benefits, costs, interactions, and alternatives were discussed as indicated.  I advised her to contact the office if her condition worsens, changes or fails to improve as anticipated. She expressed understanding with the diagnosis(es) and plan.       Lauren Rocha, who was evaluated through a patient-initiated, synchronous (real-time) audio-video encounter, and/or her healthcare decision maker, is aware that it is a billable service, with coverage as determined by her insurance carrier. She provided verbal consent to proceed: Yes, and patient identification was verified. It was conducted pursuant to the emergency declaration under the and the , 1135 waiver authority and the and Act. A caregiver was present when appropriate. Ability to conduct physical exam was limited. I was at home. The patient was at home.      , MD

## 2019-02-06 NOTE — Telephone Encounter (Signed)
Patient stated, she is still waiting on the work excuse letter . Patient is still having side effects from COVID  Please place letter in her mychart, therefore, she can print it off and give it to her employer. Thanks!

## 2019-02-07 ENCOUNTER — Other Ambulatory Visit: Payer: Self-pay | Admitting: Family Medicine

## 2019-02-07 DIAGNOSIS — Z3041 Encounter for surveillance of contraceptive pills: Secondary | ICD-10-CM

## 2019-02-10 ENCOUNTER — Encounter: Payer: BLUE CROSS/BLUE SHIELD | Attending: Adolescent Medicine | Primary: Internal Medicine

## 2019-03-01 ENCOUNTER — Other Ambulatory Visit: Payer: Self-pay | Admitting: Family Medicine

## 2019-03-01 DIAGNOSIS — Z3041 Encounter for surveillance of contraceptive pills: Secondary | ICD-10-CM

## 2019-03-03 ENCOUNTER — Telehealth
Admit: 2019-03-03 | Discharge: 2019-03-03 | Payer: BLUE CROSS/BLUE SHIELD | Attending: Internal Medicine | Primary: Internal Medicine

## 2019-03-03 ENCOUNTER — Telehealth: Attending: Internal Medicine | Primary: Internal Medicine

## 2019-03-03 DIAGNOSIS — F908 Attention-deficit hyperactivity disorder, other type: Secondary | ICD-10-CM

## 2019-03-03 MED ORDER — ALBUTEROL SULFATE 0.083 % (0.83 MG/ML) SOLN FOR INHALATION
2.5 mg /3 mL (0.083 %) | INHALATION_SOLUTION | RESPIRATORY_TRACT | 3 refills | Status: AC | PRN
Start: 2019-03-03 — End: ?

## 2019-03-03 MED ORDER — AMPHETAMINE-DEXTROAMPHETAMINE 5 MG TAB
5 mg | ORAL_TABLET | Freq: Two times a day (BID) | ORAL | 0 refills | Status: DC
Start: 2019-03-03 — End: 2019-06-27

## 2019-03-03 NOTE — Progress Notes (Signed)
Lauren Rocha is a 39 y.o. female who was seen by synchronous (real-time) audio-video technology on 03/03/2019 for No chief complaint on file.        Assessment & Plan:   Diagnoses and all orders for this visit:    1. Attention deficit hyperactivity disorder (ADHD), other type  -     dextroamphetamine-amphetamine (ADDERALL) 5 mg tablet; Take 1 Tab by mouth two (2) times a day. Max Daily Amount: 10 mg.    2. Dyspnea, unspecified type  I have advised patient to follow up with pulmonary as discussed at prior visit. Differential includes asthma vs RAD vs anxiety. She would benefit from repeat chest xray and pulmonary function testing  Other orders  -     albuterol (PROVENTIL VENTOLIN) 2.5 mg /3 mL (0.083 %) nebu; 3 mL by Nebulization route every four (4) hours as needed for Wheezing.            Subjective:     Patient has been recovering from COVID-19. She has returned to work.  She had some shortness of breath prior to contracting covid and she feels this has continued. on Friday at work she had an anxiety attack and could not breathe.  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 She feels this contributes to her difficulty ventilating. She did use a handheld albuterol inhaler at the time which helped some. She has not been diagnosed with asthma She has ordered a nebulizer.    ADHD- of note patient has noticed an improvement in symptoms with taking the adderall 5 mg bid  She denies any side effects      Prior to Admission medications    Medication Sig Start Date End Date Taking? Authorizing Provider   dextroamphetamine-amphetamine (ADDERALL) 5 mg tablet Take 1 Tab by mouth two (2) times a day. Max Daily Amount: 10 mg. 01/01/19   Deno Etienne, MD   escitalopram oxalate (LEXAPRO) 20 mg tablet Take 1 Tab by mouth daily. 10/01/18   Deno Etienne, MD   ibuprofen (MOTRIN) 800 mg tablet Take 1 Tab by mouth every eight (8) hours as needed for Pain. Take with food or milk; report use over 3x a week  08/27/18   Mallie Snooks, MD   cholecalciferol (VITAMIN D3) (50,000 UNITS /1250 MCG) capsule Take 1 Cap by mouth every seven (7) days. Indications: low vitamin D levels 04/02/18   Deno Etienne, MD   LORazepam (ATIVAN) 0.5 mg tablet Take 0.5 mg by mouth. 06/05/17   Provider, Historical   metFORMIN ER (GLUCOPHAGE XR) 500 mg tablet Take 2 tabs po daily  Patient taking differently: Take 750 mg by mouth daily (with dinner). Take 2 tabs po daily 02/18/18   Deno Etienne, MD   levonorgestrel-ethinyl estradiol (KURVELO, 28,) 0.15-0.03 mg tab Take 1 Tab by mouth daily. 02/18/18   Deno Etienne, MD   cetirizine (ZYRTEC) 10 mg tablet TAKE 1 TABLET BY MOUTH EVERY DAY 02/18/18   Deno Etienne, MD         Review of Systems   Constitutional: Negative for weight loss.   Eyes: Negative for blurred vision.   Respiratory: Positive for shortness of breath.    Cardiovascular: Negative for chest pain and leg swelling.   Genitourinary: Negative for frequency and urgency.   Musculoskeletal: Negative for joint pain.   Neurological: Negative for headaches.       Objective:     Patient-Reported Vitals  01/02/2019   Patient-Reported Weight 225.1lb   Patient-Reported Pulse 86   Patient-Reported Temperature 98.4   Patient-Reported SpO2 98   Patient-Reported Systolic  121   Patient-Reported Diastolic 79        [INSTRUCTIONS:  "[x]" Indicates a positive item  "[]" Indicates a negative item  -- DELETE ALL ITEMS NOT EXAMINED]    Constitutional: [x] Appears well-developed and well-nourished [x] No apparent distress      [] Abnormal -     Mental status: [x] Alert and awake  [x] Oriented to person/place/time [x] Able to follow commands    [] Abnormal -     Eyes:   EOM    [x]  Normal    [] Abnormal -   Sclera  [x]  Normal    [] Abnormal -          Discharge [x]  None visible   [] Abnormal -     HENT: [x] Normocephalic, atraumatic  [] Abnormal -   [x] Mouth/Throat: Mucous membranes are moist    External Ears [x] Normal  [] Abnormal -    Neck: [x] No  visualized mass [] Abnormal -     Pulmonary/Chest: [x] Respiratory effort normal   [x] No visualized signs of difficulty breathing or respiratory distress        [] Abnormal -      Musculoskeletal:   [x] Normal gait with no signs of ataxia         [x] Normal range of motion of neck        [] Abnormal -     Neurological:        [x] No Facial Asymmetry (Cranial nerve 7 motor function) (limited exam due to video visit)          [x] No gaze palsy        [] Abnormal -          Skin:        [x] No significant exanthematous lesions or discoloration noted on facial skin         [] Abnormal -            Psychiatric:       [x] Normal Affect [] Abnormal -        [x] No Hallucinations    Other pertinent observable physical exam findings:-        We discussed the expected course, resolution and complications of the diagnosis(es) in detail.  Medication risks, benefits, costs, interactions, and alternatives were discussed as indicated.  I advised her to contact the office if her condition worsens, changes or fails to improve as anticipated. She expressed understanding with the diagnosis(es) and plan.       Javae H Bostwick, who was evaluated through a patient-initiated, synchronous (real-time) audio-video encounter, and/or her healthcare decision maker, is aware that it is a billable service, with coverage as determined by her insurance carrier. She provided verbal consent to proceed: Yes, and patient identification was verified. It was conducted pursuant to the emergency declaration under the [place] Act and the National Emergencies Act, 1135 waiver authority and the Coronavirus Preparedness and Response Supplemental Appropriations Act. A caregiver was present when appropriate. Ability to conduct physical exam was limited. I was at home. The patient was at home.      Diante Barley B Latham, MD

## 2019-03-03 NOTE — Telephone Encounter (Signed)
[place]. NTBS 30 days given 02/07/19 

## 2019-03-03 NOTE — Progress Notes (Signed)
Lauren Rocha is a 39 y.o. female who was seen by synchronous (real-time) audio-video technology on 03/03/2019 for No chief complaint on file.        Assessment & Plan:   Diagnoses and all orders for this visit:    1. Attention deficit hyperactivity disorder (ADHD), other type  -     dextroamphetamine-amphetamine (ADDERALL) 5 mg tablet; Take 1 Tab by mouth two (2) times a day. Max Daily Amount: 10 mg.    2. Dyspnea, unspecified type  I have advised patient to follow up with pulmonary as discussed at prior visit. Differential includes asthma vs RAD vs anxiety. She would benefit from repeat chest xray and pulmonary function testing  Other orders  -     albuterol (PROVENTIL VENTOLIN) 2.5 mg /3 mL (0.083 %) nebu; 3 mL by Nebulization route every four (4) hours as needed for Wheezing.            Subjective:     Patient has been recovering from COVID-19. She has returned to work.  She had some shortness of breath prior to contracting covid and she feels this has continued. on Friday at work she had an anxiety attack and could not breathe.  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 She feels this contributes to her difficulty ventilating. She did use a handheld albuterol inhaler at the time which helped some. She has not been diagnosed with asthma She has ordered a nebulizer.    ADHD- of note patient has noticed an improvement in symptoms with taking the adderall 5 mg bid  She denies any side effects      Prior to Admission medications    Medication Sig Start Date End Date Taking? Authorizing Provider   dextroamphetamine-amphetamine (ADDERALL) 5 mg tablet Take 1 Tab by mouth two (2) times a day. Max Daily Amount: 10 mg. 01/01/19   Deno Etienne, MD   escitalopram oxalate (LEXAPRO) 20 mg tablet Take 1 Tab by mouth daily. 10/01/18   Deno Etienne, MD   ibuprofen (MOTRIN) 800 mg tablet Take 1 Tab by mouth every eight (8) hours as needed for Pain. Take with food or milk; report use over 3x a week  08/27/18   Mallie Snooks, MD   cholecalciferol (VITAMIN D3) (50,000 UNITS /1250 MCG) capsule Take 1 Cap by mouth every seven (7) days. Indications: low vitamin D levels 04/02/18   Deno Etienne, MD   LORazepam (ATIVAN) 0.5 mg tablet Take 0.5 mg by mouth. 06/05/17   Provider, Historical   metFORMIN ER (GLUCOPHAGE XR) 500 mg tablet Take 2 tabs po daily  Patient taking differently: Take 750 mg by mouth daily (with dinner). Take 2 tabs po daily 02/18/18   Deno Etienne, MD   levonorgestrel-ethinyl estradiol (KURVELO, 28,) 0.15-0.03 mg tab Take 1 Tab by mouth daily. 02/18/18   Deno Etienne, MD   cetirizine (ZYRTEC) 10 mg tablet TAKE 1 TABLET BY MOUTH EVERY DAY 02/18/18   Deno Etienne, MD         Review of Systems   Constitutional: Negative for weight loss.   Eyes: Negative for blurred vision.   Respiratory: Positive for shortness of breath.    Cardiovascular: Negative for chest pain and leg swelling.   Genitourinary: Negative for frequency and urgency.   Musculoskeletal: Negative for joint pain.   Neurological: Negative for headaches.       Objective:     Patient-Reported Vitals  01/02/2019   Patient-Reported Weight 225.1lb   Patient-Reported Pulse 86   Patient-Reported Temperature 98.4   Patient-Reported SpO2 98   Patient-Reported Systolic  956   Patient-Reported Diastolic 79        [INSTRUCTIONS:  "[x] " Indicates a positive item  "[] " Indicates a negative item  -- DELETE ALL ITEMS NOT EXAMINED]    Constitutional: [x]  Appears well-developed and well-nourished [x]  No apparent distress      []  Abnormal -     Mental status: [x]  Alert and awake  [x]  Oriented to person/place/time [x]  Able to follow commands    []  Abnormal -     Eyes:   EOM    [x]   Normal    []  Abnormal -   Sclera  [x]   Normal    []  Abnormal -          Discharge [x]   None visible   []  Abnormal -     HENT: [x]  Normocephalic, atraumatic  []  Abnormal -   [x]  Mouth/Throat: Mucous membranes are moist    External Ears [x]  Normal  []  Abnormal -    Neck: [x]  No  visualized mass []  Abnormal -     Pulmonary/Chest: [x]  Respiratory effort normal   [x]  No visualized signs of difficulty breathing or respiratory distress        []  Abnormal -      Musculoskeletal:   [x]  Normal gait with no signs of ataxia         [x]  Normal range of motion of neck        []  Abnormal -     Neurological:        [x]  No Facial Asymmetry (Cranial nerve 7 motor function) (limited exam due to video visit)          [x]  No gaze palsy        []  Abnormal -          Skin:        [x]  No significant exanthematous lesions or discoloration noted on facial skin         []  Abnormal -            Psychiatric:       [x]  Normal Affect []  Abnormal -        [x]  No Hallucinations    Other pertinent observable physical exam findings:-        We discussed the expected course, resolution and complications of the diagnosis(es) in detail.  Medication risks, benefits, costs, interactions, and alternatives were discussed as indicated.  I advised her to contact the office if her condition worsens, changes or fails to improve as anticipated. She expressed understanding with the diagnosis(es) and plan.       Yocelyn H Garlick, who was evaluated through a patient-initiated, synchronous (real-time) audio-video encounter, and/or her healthcare decision maker, is aware that it is a billable service, with coverage as determined by her insurance carrier. She provided verbal consent to proceed: Yes, and patient identification was verified. It was conducted pursuant to the emergency declaration under the [place] waiver authority and the [employer] Act. A caregiver was present when appropriate. Ability to conduct physical exam was limited. I was at home. The patient was at home.      Darlys Gales, MD

## 2019-03-06 ENCOUNTER — Other Ambulatory Visit: Payer: Self-pay | Admitting: Family Medicine

## 2019-03-06 DIAGNOSIS — Z3041 Encounter for surveillance of contraceptive pills: Secondary | ICD-10-CM

## 2019-03-07 NOTE — Telephone Encounter (Signed)
[place]. NTBS 30 days given 02/07/19 

## 2019-03-10 ENCOUNTER — Encounter

## 2019-03-11 NOTE — Telephone Encounter (Signed)
Pt. Is waiting for her medication to be approve   Pending Medication Requests       levonorgestrel/ethin.estradiol 0.15-0.03 mg TAKE 1 TABLET BY MOUTH DAILY. (NEEDS TO BE SEEN BEFORE NEXT REFILL)

## 2019-03-13 ENCOUNTER — Telehealth: Payer: Self-pay | Admitting: Family Medicine

## 2019-03-13 ENCOUNTER — Encounter

## 2019-03-13 DIAGNOSIS — Z3041 Encounter for surveillance of contraceptive pills: Secondary | ICD-10-CM

## 2019-03-13 MED ORDER — LEVONORGESTREL-ETHINYL ESTRAD 0.15-30 MG-MCG PO TABS: 1.0000 | ORAL_TABLET | Freq: Every day | ORAL | 0 refills | Status: AC

## 2019-03-13 MED ORDER — LEVONORGESTREL-ETHINYL ESTRADIOL 0.15 MG-30 MCG TAB
PACK | Freq: Every day | ORAL | 3 refills | Status: DC
Start: 2019-03-13 — End: 2019-05-13

## 2019-03-13 NOTE — Telephone Encounter (Signed)
What is the name of the medication? levonorgestrel-ethinyl estradiol (ALTAVERA) 0.15-30 MG-MCG tablet   Have you contacted your pharmacy to request a refill? yes  Which pharmacy would you like this sent to? [facility] [address] [place]   Patient notified that their request is being sent to the clinical staff for review and that they should receive a call once it is complete. If they do not receive a call within 24 hours they can check with their pharmacy or our office.

## 2019-03-13 NOTE — Telephone Encounter (Signed)
Scheduled appointment for pap smear on 04/03/2019 with Dr. Nadine Counts.  Sent in a one month supply of birth control pills.

## 2019-04-03 ENCOUNTER — Encounter: Payer: Self-pay | Admitting: Family Medicine

## 2019-04-03 ENCOUNTER — Ambulatory Visit: Payer: Managed Care, Other (non HMO) | Admitting: Family Medicine

## 2019-04-11 ENCOUNTER — Encounter

## 2019-04-14 MED ORDER — AMPHETAMINE-DEXTROAMPHETAMINE 5 MG TAB
5 mg | ORAL_TABLET | Freq: Two times a day (BID) | ORAL | 0 refills | Status: DC
Start: 2019-04-14 — End: 2019-05-13

## 2019-05-06 ENCOUNTER — Encounter

## 2019-05-13 ENCOUNTER — Encounter

## 2019-05-13 MED ORDER — METFORMIN SR 500 MG 24 HR TABLET
500 mg | ORAL_TABLET | ORAL | 1 refills | Status: DC
Start: 2019-05-13 — End: 2019-10-23

## 2019-05-13 MED ORDER — CHOLECALCIFEROL (VITAMIN D3) 50,000 UNIT CAPSULE
ORAL_CAPSULE | ORAL | 0 refills | Status: DC
Start: 2019-05-13 — End: 2019-07-08

## 2019-05-13 MED ORDER — LEVONORGESTREL-ETHINYL ESTRADIOL 0.15 MG-30 MCG TAB
PACK | Freq: Every day | ORAL | 3 refills | Status: DC
Start: 2019-05-13 — End: 2020-06-21

## 2019-05-13 MED ORDER — AMPHETAMINE-DEXTROAMPHETAMINE 5 MG TAB
5 mg | ORAL_TABLET | Freq: Two times a day (BID) | ORAL | 0 refills | Status: DC
Start: 2019-05-13 — End: 2019-08-21

## 2019-05-13 MED ORDER — CETIRIZINE 10 MG TAB
10 mg | ORAL_TABLET | ORAL | 1 refills | Status: DC
Start: 2019-05-13 — End: 2019-10-23

## 2019-05-26 ENCOUNTER — Telehealth: Admit: 2019-05-26 | Payer: BLUE CROSS/BLUE SHIELD | Attending: Clinical | Primary: Internal Medicine

## 2019-05-26 ENCOUNTER — Telehealth: Attending: Clinical | Primary: Internal Medicine

## 2019-05-26 DIAGNOSIS — F419 Anxiety disorder, unspecified: Secondary | ICD-10-CM
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
## 2019-06-05 ENCOUNTER — Encounter

## 2019-06-09 MED ORDER — ESCITALOPRAM 20 MG TAB
20 mg | ORAL_TABLET | ORAL | 1 refills | Status: DC
Start: 2019-06-09 — End: 2019-10-23

## 2019-06-27 ENCOUNTER — Ambulatory Visit
Admit: 2019-06-27 | Discharge: 2019-06-27 | Payer: BLUE CROSS/BLUE SHIELD | Attending: Internal Medicine | Primary: Internal Medicine

## 2019-06-27 ENCOUNTER — Encounter

## 2019-06-27 ENCOUNTER — Ambulatory Visit: Attending: Internal Medicine | Primary: Internal Medicine

## 2019-06-27 DIAGNOSIS — Z0000 Encounter for general adult medical examination without abnormal findings: Secondary | ICD-10-CM

## 2019-06-27 NOTE — Progress Notes (Signed)
 Lauren Rocha is a 39 y.o. female    Chief Complaint   Patient presents with   . Gyn Exam     1. Have you been to the ER, urgent care clinic since your last visit?  Hospitalized since your last visit? No      2. Have you seen or consulted any other health care providers outside of the [facility] since your last visit?  Include any pap smears or colon screening.  No

## 2019-06-27 NOTE — Progress Notes (Signed)
Lauren Rocha is a 38 y.o. female    Chief Complaint   Patient presents with   ??? Gyn Exam     1. Have you been to the ER, urgent care clinic since your last visit?  Hospitalized since your last visit? No      2. Have you seen or consulted any other health care providers outside of the [facility] since your last visit?  Include any pap smears or colon screening.  No

## 2019-06-27 NOTE — Progress Notes (Signed)
Subjective:      Lauren Rocha is a 39 y.o. female who presents today for   Chief Complaint   Patient presents with   ??? Gyn Exam      Physical   Recently obtained a Masters in [employer]! Celebration is planned for this weekend in [place]    PMH:  ADD  Vit D def  Hyperlipidemia  Anxiety  PCOS  ??  Pap due today  Has hx of Normal pap smears  Last menses 3 weeks ago  Takes OCPs  No vag itching or discharge    ??  mammo  Hx benign rt breast cyst removal  Maternal grandmother and 4 maternal great aunts have breast cancer  Has follow up every 6 mons  ??  Supplements  Advised to take MVI but patient had allergic rxn in the past  ??  Immunizations-  Advised flu vaccine  TDAP  Is due  COVID fully vaccinated Phizer  ??  Diet/exercise  Regular diet    ??  ophthal needs referral  ??  Dentist UTD  ??  ??          Screen Hep C      Patient Active Problem List    Diagnosis Date Noted   ??? Anxiety 05/26/2019   ??? Severe obesity ([facility]) 02/18/2018     Current Outpatient Medications   Medication Sig Dispense Refill   ??? escitalopram oxalate (LEXAPRO) 20 mg tablet TAKE 1 TABLET BY MOUTH EVERY DAY 90 Tab 1   ??? metFORMIN ER (GLUCOPHAGE XR) 500 mg tablet Take 2 tabs po daily 180 Tab 1   ??? cetirizine (ZYRTEC) 10 mg tablet TAKE 1 TABLET BY MOUTH EVERY DAY 90 Tab 1   ??? cholecalciferol (VITAMIN D3) (50,000 UNITS /1250 MCG) capsule Take 1 Cap by mouth every seven (7) days. Indications: low vitamin D levels 8 Cap 0   ??? levonorgestrel-ethinyl estradiol (Kurvelo, 28,) 0.15-0.03 mg tab Take 1 Tab by mouth daily. 90 Package 3   ??? dextroamphetamine-amphetamine (ADDERALL) 5 mg tablet Take 1 Tab by mouth two (2) times a day. Max Daily Amount: 10 mg. 60 Tab 0   ??? albuterol (PROVENTIL VENTOLIN) 2.5 mg /3 mL (0.083 %) nebu 3 mL by Nebulization route every four (4) hours as needed for Wheezing. 30 Nebule 3   ??? ibuprofen (MOTRIN) 800 mg tablet Take 1 Tab by mouth every eight (8) hours as needed for Pain. Take with food or milk; report use over 3x a week 15 Tab 0   ???  LORazepam (ATIVAN) 0.5 mg tablet Take 0.5 mg by mouth.     ??? dextroamphetamine-amphetamine (ADDERALL) 5 mg tablet Take 1 Tab by mouth two (2) times a day. Max Daily Amount: 10 mg. (Patient not taking: Reported on 06/27/2019) 60 Tab 0     Allergies   Allergen Reactions   ??? Strawberry Anaphylaxis   ??? Augmentin [Amoxicillin-Pot Clavulanate] Other (comments)     Rectal bleeding       Past Medical History:   Diagnosis Date   ??? Anxiety    ??? PCOS (polycystic ovarian syndrome)    ??? Sciatica      Past Surgical History:   Procedure Laterality Date   ??? HX ORTHOPAEDIC     ??? PR BREAST SURGERY PROCEDURE UNLISTED      r breast cyst removal     Family History   Problem Relation Age of Onset   ??? Cancer Maternal Aunt  breast   ??? Diabetes Maternal Grandmother    ??? Cancer Maternal Grandmother         breast   ??? Breast Cancer Maternal Grandmother      Social History     Tobacco Use   ??? Smoking status: Never Smoker   ??? Smokeless tobacco: Never Used   Substance Use Topics   ??? Alcohol use: Yes        Review of Systems    A comprehensive review of systems was negative except for that written in the HPI.     Objective:     Visit Vitals  BP 132/84 (BP 1 Location: Left upper arm, BP Patient Position: Sitting)   Pulse 92   Temp 98.3 ??F (36.8 ??C) (Oral)   Resp 20   Ht 5\' 2"  (1.575 m)   Wt 212 lb (96.2 kg)   LMP 06/06/2019 (Approximate)   SpO2 100%   BMI 38.78 kg/m??     General:  Alert, cooperative, no distress, appears stated age.   Head:  Normocephalic, without obvious abnormality, atraumatic.   Eyes:  Conjunctivae/corneas clear. PERRL, EOMs intact.    Ears:  Normal TMs and external ear canals both ears.   Nose: Nares normal. Septum midline. Mucosa normal. No drainage or sinus tenderness.   Throat: Lips, mucosa, and tongue normal. Teeth and gums normal.   Neck: Supple, symmetrical, trachea midline, no adenopathy, thyroid: no enlargement/tenderness/nodules, no carotid bruit and no JVD.   Back:   Symmetric, no curvature. ROM normal. No CVA  tenderness.   Lungs:   Clear to auscultation bilaterally.   Chest wall:  No tenderness or deformity.   Heart:  Regular rate and rhythm, S1, S2 normal, no murmur, click, rub or gallop.   Abdomen:   Soft, non-tender. Bowel sounds normal. No masses,  No organomegaly.   Extremities: Extremities normal, atraumatic, no cyanosis or edema.   Pulses: 2+ and symmetric all extremities.   Skin: Skin color, texture, turgor normal. No rashes or lesions.   Lymph nodes: Cervical, supraclavicular, and axillary nodes normal.   Neurologic: CNII-XII intact. Normal strength, sensation and reflexes throughout.  Breast exam: symmetrical, no masses, no adenopathy  Pelvic: normal external genitalia, no CMT, normal adnexa       Assessment/Plan:       ICD-10-CM ICD-9-CM    1. Physical exam  Z00.00 V70.9 HEPATITIS C AB      REFERRAL TO OPHTHALMOLOGY      METABOLIC PANEL, COMPREHENSIVE      LIPID PANEL      HEMOGLOBIN A1C WITH EAG      CBC WITH AUTOMATED DIFF      URINALYSIS W/ RFLX MICROSCOPIC      URINALYSIS W/ RFLX MICROSCOPIC      CBC WITH AUTOMATED DIFF      HEMOGLOBIN A1C WITH EAG      LIPID PANEL      METABOLIC PANEL, COMPREHENSIVE      HEPATITIS C AB   2. Pap smear for cervical cancer screening  Z12.4 V76.2 PAP IG, APTIMA HPV AND RFX 16/18,45 08/06/2019)      PAP IG, APTIMA HPV AND RFX 16/18,45 ([id number])   3. Visit for pelvic exam  Z01.419 V72.31 PAP IG, APTIMA HPV AND RFX 16/18,45 ([id number])      PAP IG, APTIMA HPV AND RFX 16/18,45 ([id number])   4. Encounter for immunization  Z23 V03.89 TETANUS, DIPHTHERIA TOXOIDS AND ACELLULAR PERTUSSIS VACCINE (TDAP), IN INDIVIDS. >=7, IM  Advised her to call back or return to office if symptoms worsen/change/persist.  Discussed expected course/resolution/complications of diagnosis in detail with patient.    Medication risks/benefits/costs/interactions/alternatives discussed with patient.  She was given an after visit summary which includes diagnoses, current medications, & vitals.  She expressed  understanding with the diagnosis and plan.

## 2019-06-27 NOTE — Progress Notes (Signed)
Subjective:      Lauren Rocha is a 39 y.o. female who presents today for   Chief Complaint   Patient presents with   ??? Gyn Exam      Physical   Recently obtained a Masters in [employer]! Celebration is planned for this weekend in [place]    PMH:  ADD  Vit D def  Hyperlipidemia  Anxiety  PCOS  ??  Pap due today  Has hx of Normal pap smears  Last menses 3 weeks ago  Takes OCPs  No vag itching or discharge    ??  mammo  Hx benign rt breast cyst removal  Maternal grandmother and 4 maternal great aunts have breast cancer  Has follow up every 6 mons  ??  Supplements  Advised to take MVI but patient had allergic rxn in the past  ??  Immunizations-  Advised flu vaccine  TDAP  Is due  COVID fully vaccinated Phizer  ??  Diet/exercise  Regular diet    ??  ophthal needs referral  ??  Dentist UTD  ??  ??          Screen Hep C      Patient Active Problem List    Diagnosis Date Noted   ??? Anxiety 05/26/2019   ??? Severe obesity ([facility]) 02/18/2018     Current Outpatient Medications   Medication Sig Dispense Refill   ??? escitalopram oxalate (LEXAPRO) 20 mg tablet TAKE 1 TABLET BY MOUTH EVERY DAY 90 Tab 1   ??? metFORMIN ER (GLUCOPHAGE XR) 500 mg tablet Take 2 tabs po daily 180 Tab 1   ??? cetirizine (ZYRTEC) 10 mg tablet TAKE 1 TABLET BY MOUTH EVERY DAY 90 Tab 1   ??? cholecalciferol (VITAMIN D3) (50,000 UNITS /1250 MCG) capsule Take 1 Cap by mouth every seven (7) days. Indications: low vitamin D levels 8 Cap 0   ??? levonorgestrel-ethinyl estradiol (Kurvelo, 28,) 0.15-0.03 mg tab Take 1 Tab by mouth daily. 90 Package 3   ??? dextroamphetamine-amphetamine (ADDERALL) 5 mg tablet Take 1 Tab by mouth two (2) times a day. Max Daily Amount: 10 mg. 60 Tab 0   ??? albuterol (PROVENTIL VENTOLIN) 2.5 mg /3 mL (0.083 %) nebu 3 mL by Nebulization route every four (4) hours as needed for Wheezing. 30 Nebule 3   ??? ibuprofen (MOTRIN) 800 mg tablet Take 1 Tab by mouth every eight (8) hours as needed for Pain. Take with food or milk; report use over 3x a week 15 Tab 0   ???  LORazepam (ATIVAN) 0.5 mg tablet Take 0.5 mg by mouth.     ??? dextroamphetamine-amphetamine (ADDERALL) 5 mg tablet Take 1 Tab by mouth two (2) times a day. Max Daily Amount: 10 mg. (Patient not taking: Reported on 06/27/2019) 60 Tab 0     Allergies   Allergen Reactions   ??? Strawberry Anaphylaxis   ??? Augmentin [Amoxicillin-Pot Clavulanate] Other (comments)     Rectal bleeding       Past Medical History:   Diagnosis Date   ??? Anxiety    ??? PCOS (polycystic ovarian syndrome)    ??? Sciatica      Past Surgical History:   Procedure Laterality Date   ??? HX ORTHOPAEDIC     ??? PR BREAST SURGERY PROCEDURE UNLISTED      r breast cyst removal     Family History   Problem Relation Age of Onset   ??? Cancer Maternal Aunt           breast   ??? Diabetes Maternal Grandmother    ??? Cancer Maternal Grandmother         breast   ??? Breast Cancer Maternal Grandmother      Social History     Tobacco Use   ??? Smoking status: Never Smoker   ??? Smokeless tobacco: Never Used   Substance Use Topics   ??? Alcohol use: Yes        Review of Systems    A comprehensive review of systems was negative except for that written in the HPI.     Objective:     Visit Vitals  BP 132/84 (BP 1 Location: Left upper arm, BP Patient Position: Sitting)   Pulse 92   Temp 98.3 ??F (36.8 ??C) (Oral)   Resp 20   Ht 5\' 2"  (1.575 m)   Wt 212 lb (96.2 kg)   LMP 06/06/2019 (Approximate)   SpO2 100%   BMI 38.78 kg/m??     General:  Alert, cooperative, no distress, appears stated age.   Head:  Normocephalic, without obvious abnormality, atraumatic.   Eyes:  Conjunctivae/corneas clear. PERRL, EOMs intact.    Ears:  Normal TMs and external ear canals both ears.   Nose: Nares normal. Septum midline. Mucosa normal. No drainage or sinus tenderness.   Throat: Lips, mucosa, and tongue normal. Teeth and gums normal.   Neck: Supple, symmetrical, trachea midline, no adenopathy, thyroid: no enlargement/tenderness/nodules, no carotid bruit and no JVD.   Back:   Symmetric, no curvature. ROM normal. No CVA  tenderness.   Lungs:   Clear to auscultation bilaterally.   Chest wall:  No tenderness or deformity.   Heart:  Regular rate and rhythm, S1, S2 normal, no murmur, click, rub or gallop.   Abdomen:   Soft, non-tender. Bowel sounds normal. No masses,  No organomegaly.   Extremities: Extremities normal, atraumatic, no cyanosis or edema.   Pulses: 2+ and symmetric all extremities.   Skin: Skin color, texture, turgor normal. No rashes or lesions.   Lymph nodes: Cervical, supraclavicular, and axillary nodes normal.   Neurologic: CNII-XII intact. Normal strength, sensation and reflexes throughout.  Breast exam: symmetrical, no masses, no adenopathy  Pelvic: normal external genitalia, no CMT, normal adnexa       Assessment/Plan:       ICD-10-CM ICD-9-CM    1. Physical exam  Z00.00 V70.9 HEPATITIS C AB      REFERRAL TO OPHTHALMOLOGY      METABOLIC PANEL, COMPREHENSIVE      LIPID PANEL      HEMOGLOBIN A1C WITH EAG      CBC WITH AUTOMATED DIFF      URINALYSIS W/ RFLX MICROSCOPIC      URINALYSIS W/ RFLX MICROSCOPIC      CBC WITH AUTOMATED DIFF      HEMOGLOBIN A1C WITH EAG      LIPID PANEL      METABOLIC PANEL, COMPREHENSIVE      HEPATITIS C AB   2. Pap smear for cervical cancer screening  Z12.4 V76.2 PAP IG, APTIMA HPV AND RFX 16/18,45 08/06/2019)      PAP IG, APTIMA HPV AND RFX 16/18,45 ([id number])   3. Visit for pelvic exam  Z01.419 V72.31 PAP IG, APTIMA HPV AND RFX 16/18,45 ([id number])      PAP IG, APTIMA HPV AND RFX 16/18,45 ([id number])   4. Encounter for immunization  Z23 V03.89 TETANUS, DIPHTHERIA TOXOIDS AND ACELLULAR PERTUSSIS VACCINE (TDAP), IN INDIVIDS. >=7, IM  Advised her to call back or return to office if symptoms worsen/change/persist.  Discussed expected course/resolution/complications of diagnosis in detail with patient.    Medication risks/benefits/costs/interactions/alternatives discussed with patient.  She was given an after visit summary which includes diagnoses, current medications, & vitals.  She expressed  understanding with the diagnosis and plan.

## 2019-06-28 LAB — URINALYSIS W/ RFLX MICROSCOPIC
Bilirubin, Urine: NEGATIVE
Bilirubin: NEGATIVE
Blood, Urine: NEGATIVE
Blood: NEGATIVE
Glucose, UA: NEGATIVE
Glucose: NEGATIVE
Leukocyte Esterase, Urine: NEGATIVE
Leukocyte Esterase: NEGATIVE
Nitrite, Urine: NEGATIVE
Nitrites: NEGATIVE
Protein, UA: NEGATIVE
Protein: NEGATIVE
Specific Gravity, UA: 1.028 NA (ref 1.005–1.030)
Specific Gravity: 1.028 (ref 1.005–1.030)
Urobilinogen, Urine: 0.2 mg/dL (ref 0.2–1.0)
Urobilinogen: 0.2 mg/dL (ref 0.2–1.0)
pH (UA): 5.5 (ref 5.0–7.5)
pH, UA: 5.5 NA (ref 5.0–7.5)

## 2019-06-28 LAB — CBC WITH AUTOMATED DIFF
ABS. BASOPHILS: 0 10*3/uL (ref 0.0–0.2)
ABS. EOSINOPHILS: 0 10*3/uL (ref 0.0–0.4)
ABS. IMM. GRANS.: 0 10*3/uL (ref 0.0–0.1)
ABS. MONOCYTES: 0.4 10*3/uL (ref 0.1–0.9)
ABS. NEUTROPHILS: 2.2 10*3/uL (ref 1.4–7.0)
Abs Lymphocytes: 2.2 10*3/uL (ref 0.7–3.1)
BASOPHILS: 1 %
EOSINOPHILS: 1 %
HCT: 40.1 % (ref 34.0–46.6)
HGB: 13 g/dL (ref 11.1–15.9)
IMMATURE GRANULOCYTES: 0 %
Lymphocytes: 45 %
MCH: 29 pg (ref 26.6–33.0)
MCHC: 32.4 g/dL (ref 31.5–35.7)
MCV: 89 fL (ref 79–97)
MONOCYTES: 8 %
NEUTROPHILS: 45 %
PLATELET: 369 10*3/uL (ref 150–450)
RBC: 4.49 x10E6/uL (ref 3.77–5.28)
RDW: 12.5 % (ref 11.7–15.4)
WBC: 4.8 10*3/uL (ref 3.4–10.8)

## 2019-06-28 LAB — METABOLIC PANEL, COMPREHENSIVE
A-G Ratio: 1.8 (ref 1.2–2.2)
ALT (SGPT): 14 IU/L (ref 0–32)
AST (SGOT): 13 IU/L (ref 0–40)
Albumin: 4.1 g/dL (ref 3.8–4.8)
Alk. phosphatase: 43 IU/L — ABNORMAL LOW (ref 48–121)
BUN/Creatinine ratio: 12 (ref 9–23)
BUN: 10 mg/dL (ref 6–20)
Bilirubin, total: 0.3 mg/dL (ref 0.0–1.2)
CO2: 21 mmol/L (ref 20–29)
Calcium: 8.7 mg/dL (ref 8.7–10.2)
Chloride: 108 mmol/L — ABNORMAL HIGH (ref 96–106)
Creatinine: 0.84 mg/dL (ref 0.57–1.00)
GFR est AA: 103 mL/min/{1.73_m2} (ref >59)
GFR est non-AA: 89 mL/min/{1.73_m2} (ref >59)
GLOBULIN, TOTAL: 2.3 g/dL (ref 1.5–4.5)
Glucose: 73 mg/dL (ref 65–99)
Potassium: 4.2 mmol/L (ref 3.5–5.2)
Protein, total: 6.4 g/dL (ref 6.0–8.5)
Sodium: 140 mmol/L (ref 134–144)

## 2019-06-28 LAB — LIPID PANEL
Cholesterol, Total: 215 mg/dL — ABNORMAL HIGH (ref 100–199)
Cholesterol, total: 215 mg/dL — ABNORMAL HIGH (ref 100–199)
HDL Cholesterol: 42 mg/dL (ref >39)
HDL: 42 mg/dL (ref >39)
LDL Calculated: 160 mg/dL — ABNORMAL HIGH (ref 0–99)
LDL, calculated: 160 mg/dL — ABNORMAL HIGH (ref 0–99)
Triglyceride: 72 mg/dL (ref 0–149)
Triglycerides: 72 mg/dL (ref 0–149)
VLDL, calculated: 13 mg/dL (ref 5–40)
VLDL: 13 mg/dL (ref 5–40)

## 2019-06-28 LAB — HEPATITIS C AB: Hep C Virus Ab: <0.1 s/co ratio (ref 0.0–0.9)

## 2019-06-28 LAB — HEMOGLOBIN A1C WITH EAG
Estimated average glucose: 97 mg/dL
Hemoglobin A1c: 5 % (ref 4.8–5.6)

## 2019-06-28 LAB — CBC WITH AUTO DIFFERENTIAL
Basophils %: 1 %
Basophils Absolute: 0 10*3/uL (ref 0.0–0.2)
Eosinophils %: 1 %
Eosinophils Absolute: 0 10*3/uL (ref 0.0–0.4)
Granulocyte Absolute Count: 0 10*3/uL (ref 0.0–0.1)
Hematocrit: 40.1 % (ref 34.0–46.6)
Hemoglobin: 13 g/dL (ref 11.1–15.9)
Immature Granulocytes: 0 %
Lymphocytes %: 45 %
Lymphocytes Absolute: 2.2 10*3/uL (ref 0.7–3.1)
MCH: 29 pg (ref 26.6–33.0)
MCHC: 32.4 g/dL (ref 31.5–35.7)
MCV: 89 fL (ref 79–97)
Monocytes %: 8 %
Monocytes Absolute: 0.4 10*3/uL (ref 0.1–0.9)
Neutrophils %: 45 %
Neutrophils Absolute: 2.2 10*3/uL (ref 1.4–7.0)
Platelets: 369 10*3/uL (ref 150–450)
RBC: 4.49 x10E6/uL (ref 3.77–5.28)
RDW: 12.5 % (ref 11.7–15.4)
WBC: 4.8 10*3/uL (ref 3.4–10.8)

## 2019-06-28 LAB — HEPATITIS C ANTIBODY: HCV Ab: <0.1 s/co ratio (ref 0.0–0.9)

## 2019-06-28 LAB — COMPREHENSIVE METABOLIC PANEL
ALT: 14 IU/L (ref 0–32)
AST: 13 IU/L (ref 0–40)
Albumin/Globulin Ratio: 1.8 NA (ref 1.2–2.2)
Albumin: 4.1 g/dL (ref 3.8–4.8)
Alkaline Phosphatase: 43 IU/L — ABNORMAL LOW (ref 48–121)
BUN: 10 mg/dL (ref 6–20)
Bun/Cre Ratio: 12 NA (ref 9–23)
CO2: 21 mmol/L (ref 20–29)
Calcium: 8.7 mg/dL (ref 8.7–10.2)
Chloride: 108 mmol/L — ABNORMAL HIGH (ref 96–106)
Creatinine: 0.84 mg/dL (ref 0.57–1.00)
EGFR IF NonAfrican American: 89 mL/min/{1.73_m2} (ref >59)
GFR African American: 103 mL/min/{1.73_m2} (ref >59)
Globulin, Total: 2.3 g/dL (ref 1.5–4.5)
Glucose: 73 mg/dL (ref 65–99)
Potassium: 4.2 mmol/L (ref 3.5–5.2)
Sodium: 140 mmol/L (ref 134–144)
Total Bilirubin: 0.3 mg/dL (ref 0.0–1.2)
Total Protein: 6.4 g/dL (ref 6.0–8.5)

## 2019-06-28 LAB — HEMOGLOBIN A1C W/EAG
Hemoglobin A1C: 5 % (ref 4.8–5.6)
eAG: 97 mg/dL

## 2019-06-29 MED ORDER — AMPHETAMINE-DEXTROAMPHETAMINE 5 MG TAB
5 mg | ORAL_TABLET | Freq: Two times a day (BID) | ORAL | 0 refills | Status: DC
Start: 2019-06-29 — End: 2019-12-31

## 2019-07-03 ENCOUNTER — Telehealth: Payer: BLUE CROSS/BLUE SHIELD | Attending: Clinical | Primary: Internal Medicine

## 2019-07-03 LAB — SPECIMEN STATUS REPORT

## 2019-07-03 LAB — PAP IG, APTIMA HPV AND RFX 16/18,45 (507805)
.: 0
HPV APTIMA: NEGATIVE

## 2019-07-03 LAB — PAP IG, APTIMA HPV AND RFX 16/18,45 (199305)
HPV Aptima: NEGATIVE
LABCORP 019018: 0

## 2019-07-03 NOTE — Progress Notes (Signed)
.  bh
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
## 2019-07-08 MED ORDER — CHOLECALCIFEROL (VITAMIN D3) 50,000 UNIT CAPSULE
ORAL_CAPSULE | ORAL | 0 refills | Status: AC
Start: 2019-07-08 — End: ?

## 2019-07-30 ENCOUNTER — Telehealth
Admit: 2019-07-30 | Discharge: 2019-07-30 | Payer: BLUE CROSS/BLUE SHIELD | Attending: Internal Medicine | Primary: Internal Medicine

## 2019-07-30 ENCOUNTER — Telehealth: Payer: BLUE CROSS/BLUE SHIELD | Attending: Clinical | Primary: Internal Medicine

## 2019-07-30 ENCOUNTER — Telehealth: Attending: Internal Medicine | Primary: Internal Medicine

## 2019-07-30 DIAGNOSIS — R61 Generalized hyperhidrosis: Secondary | ICD-10-CM

## 2019-07-30 NOTE — Progress Notes (Signed)
 Lauren Rocha is a 39 y.o. female    Chief Complaint   Patient presents with   . Sweats     1. Have you been to the ER, urgent care clinic since your last visit?  Hospitalized since your last visit? No      2. Have you seen or consulted any other health care providers outside of the [facility] since your last visit?  Include any pap smears or colon screening.  No

## 2019-07-30 NOTE — Progress Notes (Signed)
.  bh

## 2019-07-30 NOTE — Progress Notes (Signed)
Lauren Rocha is a 39 y.o. female who was seen by synchronous (real-time) audio-video technology on 07/30/2019 for Sweats        Assessment & Plan:   Diagnoses and all orders for this visit:    1. Sweating increase    2. Sweating profusely      Unsure of etiology at this time as to why patient has started sweating profusely.  She denies any fever or chills or upper respiratory symptoms no cough.  Her menstrual cycles are regular denies pregnancy.  Lab reports done in May showed a normal CBC and CMP.  To better determine the cause of this symptom will check TFTs LH and FSH will also repeat CBC and CMP.  Order QuantiFERON gold to rule out TB.  Order chest x-ray to rule out any lung abnormality lung malignancy evidence of TB.  Order EKG to rule out or screen for any cardiac cause.  Would also advise temporary discontinuation of Adderall since patient started this medication close to when she started having these new symptoms.    Check TFT. LH and FSH  Cbc,cmp (although normal in May)  quantiferon Gold  cxray  EKG  Discontinue adderall  Subjective:   Patient says she has started sweating a lot. She says this started earlier this year and has gotten bad over the last 2 months. Worse with exertion. She says she often has to change her clothes. Happens during the day and at night. Denies any cp or sob. No documented fever. Menstrual cycles are regular. She did start adderall a few weeks ago but not sure if related to the start of adderall or if she had these symptoms before      Prior to Admission medications    Medication Sig Start Date End Date Taking? Authorizing Provider   cholecalciferol (VITAMIN D3) (50,000 UNITS /1250 MCG) capsule TAKE 1 CAP BY MOUTH EVERY SEVEN (7) DAYS. INDICATIONS: LOW VITAMIN D LEVELS 07/08/19  Yes Latham-Solomon, Claris Gladden, MD   escitalopram oxalate (LEXAPRO) 20 mg tablet TAKE 1 TABLET BY MOUTH EVERY DAY 06/09/19  Yes Latham-Solomon, Claris Gladden, MD   metFORMIN ER (GLUCOPHAGE XR) 500 mg tablet Take 2  tabs po daily 05/13/19  Yes Latham-Solomon, Claris Gladden, MD   cetirizine (ZYRTEC) 10 mg tablet TAKE 1 TABLET BY MOUTH EVERY DAY 05/13/19  Yes Latham-Solomon, Claris Gladden, MD   levonorgestrel-ethinyl estradiol Williemae Natter, 28,) 0.15-0.03 mg tab Take 1 Tab by mouth daily. 05/13/19  Yes Latham-Solomon, Claris Gladden, MD   dextroamphetamine-amphetamine (ADDERALL) 5 mg tablet Take 1 Tab by mouth two (2) times a day. Max Daily Amount: 10 mg. 05/13/19  Yes Latham-Solomon, Claris Gladden, MD   albuterol (PROVENTIL VENTOLIN) 2.5 mg /3 mL (0.083 %) nebu 3 mL by Nebulization route every four (4) hours as needed for Wheezing. 03/03/19  Yes Latham-Solomon, Claris Gladden, MD   ibuprofen (MOTRIN) 800 mg tablet Take 1 Tab by mouth every eight (8) hours as needed for Pain. Take with food or milk; report use over 3x a week 08/27/18  Yes Mallie Snooks, MD   LORazepam (ATIVAN) 0.5 mg tablet Take 0.5 mg by mouth. 06/05/17  Yes Provider, Historical   dextroamphetamine-amphetamine (ADDERALL) 5 mg tablet Take 1 Tablet by mouth two (2) times a day. Max Daily Amount: 10 mg.  Patient not taking: Reported on 07/30/2019 06/29/19   Hanley Ben, MD         Review of Systems   Endo/Heme/Allergies:  Profuse excessive sweating       Objective:     Patient-Reported Vitals 01/02/2019   Patient-Reported Weight 225.1lb   Patient-Reported Pulse 86   Patient-Reported Temperature 98.4   Patient-Reported SpO2 98   Patient-Reported Systolic  121   Patient-Reported Diastolic 79        [INSTRUCTIONS:  "[x] " Indicates a positive item  "[] " Indicates a negative item  -- DELETE ALL ITEMS NOT EXAMINED]    Constitutional: [x]  Appears well-developed and well-nourished [x]  No apparent distress      []  Abnormal -     Mental status: [x]  Alert and awake  [x]  Oriented to person/place/time [x]  Able to follow commands    []  Abnormal -     Eyes:   EOM    [x]   Normal    []  Abnormal -   Sclera  [x]   Normal    []  Abnormal -          Discharge [x]   None visible   []  Abnormal -     HENT: [x]   Normocephalic, atraumatic  []  Abnormal -   [x]  Mouth/Throat: Mucous membranes are moist    External Ears [x]  Normal  []  Abnormal -    Neck: [x]  No visualized mass []  Abnormal -     Pulmonary/Chest: [x]  Respiratory effort normal   [x]  No visualized signs of difficulty breathing or respiratory distress        []  Abnormal -      Musculoskeletal:   [x]  Normal gait with no signs of ataxia         [x]  Normal range of motion of neck        []  Abnormal -     Neurological:        [x]  No Facial Asymmetry (Cranial nerve 7 motor function) (limited exam due to video visit)          [x]  No gaze palsy        []  Abnormal -          Skin:        [x]  No significant exanthematous lesions or discoloration noted on facial skin         []  Abnormal -            Psychiatric:       [x]  Normal Affect []  Abnormal -        [x]  No Hallucinations    Other pertinent observable physical exam findings:-        We discussed the expected course, resolution and complications of the diagnosis(es) in detail.  Medication risks, benefits, costs, interactions, and alternatives were discussed as indicated.  I advised her to contact the office if her condition worsens, changes or fails to improve as anticipated. She expressed understanding with the diagnosis(es) and plan.       Illiana H Mcinerney, was evaluated through a synchronous (real-time) audio-video encounter. The patient (or guardian if applicable) is aware that this is a billable service. Verbal consent to proceed has been obtained within the past 12 months. The visit was conducted pursuant to the emergency declaration under the and the , 1135 waiver authority and the and Act.  Patient identification was verified, and a caregiver was present when appropriate. The patient was located in a state where the provider was credentialed to provide care.      , MD

## 2019-08-18 ENCOUNTER — Encounter

## 2019-08-19 NOTE — Telephone Encounter (Signed)
Please check with patient regarding this med for ADD. At her last visit it was on hold due to her excessive sweating

## 2019-08-20 ENCOUNTER — Telehealth

## 2019-08-20 NOTE — Telephone Encounter (Signed)
Pt. Is waiting for medication to be approved. She said it do not make her sweat anymore.       Pending Medication Requests       dextroamphetamine/amphetamine 5 mg Oral 2 TIMES DAILY    Please Call [phone]

## 2019-08-21 MED ORDER — AMPHETAMINE-DEXTROAMPHETAMINE 5 MG TAB
5 mg | ORAL_TABLET | Freq: Two times a day (BID) | ORAL | 0 refills | Status: DC
Start: 2019-08-21 — End: 2019-10-14

## 2019-10-08 ENCOUNTER — Ambulatory Visit: Payer: BLUE CROSS/BLUE SHIELD | Attending: Clinical | Primary: Internal Medicine

## 2019-10-08 NOTE — Progress Notes (Signed)
.  bh

## 2019-10-14 ENCOUNTER — Encounter

## 2019-10-20 ENCOUNTER — Encounter

## 2019-10-20 NOTE — Telephone Encounter (Signed)
Pt. Is waiting for her medication to be approved     Pending Medication Requests       dextroamphetamine/amphetamine 5 mg Oral 2 TIMES DAILY

## 2019-10-23 MED ORDER — CETIRIZINE 10 MG TAB
10 mg | ORAL_TABLET | ORAL | 1 refills | Status: AC
Start: 2019-10-23 — End: ?

## 2019-10-23 MED ORDER — ESCITALOPRAM 20 MG TAB
20 mg | ORAL_TABLET | ORAL | 1 refills | Status: DC
Start: 2019-10-23 — End: 2020-02-05

## 2019-10-23 MED ORDER — METFORMIN SR 500 MG 24 HR TABLET
500 mg | ORAL_TABLET | ORAL | 1 refills | Status: DC
Start: 2019-10-23 — End: 2020-06-21

## 2019-10-24 MED ORDER — AMPHETAMINE-DEXTROAMPHETAMINE 5 MG TAB
5 mg | ORAL_TABLET | Freq: Two times a day (BID) | ORAL | 0 refills | Status: DC
Start: 2019-10-24 — End: 2019-12-02

## 2019-12-01 ENCOUNTER — Encounter

## 2019-12-02 ENCOUNTER — Encounter

## 2019-12-02 MED ORDER — AMPHETAMINE-DEXTROAMPHETAMINE 5 MG TAB
5 mg | ORAL_TABLET | Freq: Two times a day (BID) | ORAL | 0 refills | Status: DC
Start: 2019-12-02 — End: 2019-12-31

## 2019-12-02 NOTE — Telephone Encounter (Signed)
rx sent to doctor to refill

## 2019-12-02 NOTE — Telephone Encounter (Signed)
-----   Message from Marce Myron sent at 12/02/2019 11:35 AM EDT -----  Regarding: Dr.Latham-Solomon/Refill  Medication Refill    Caller (if not patient): n/a      Relationship of caller (if not patient): n/a      Best contact number(s): [phone]      Name of medication and dosage if known: Adderol or generic      Is patient out of this medication (yes/no): yes      Pharmacy name: [facility] [place]    Pharmacy listed in chart? (yes/no): n/a  Pharmacy phone number: [phone]      Details to clarify the request: n/a      Marce Myron

## 2019-12-31 ENCOUNTER — Ambulatory Visit
Admit: 2019-12-31 | Discharge: 2019-12-31 | Payer: BLUE CROSS/BLUE SHIELD | Attending: Psychiatric/Mental Health | Primary: Internal Medicine

## 2019-12-31 ENCOUNTER — Ambulatory Visit: Attending: Psychiatric/Mental Health | Primary: Internal Medicine

## 2019-12-31 DIAGNOSIS — F902 Attention-deficit hyperactivity disorder, combined type: Secondary | ICD-10-CM

## 2019-12-31 MED ORDER — AMPHETAMINE-DEXTROAMPHETAMINE 10 MG TAB
10 mg | ORAL_TABLET | Freq: Two times a day (BID) | ORAL | 0 refills | Status: DC
Start: 2019-12-31 — End: 2020-02-05

## 2019-12-31 NOTE — Progress Notes (Signed)
 Chief Complaint   Patient presents with   . New Patient     Establish care     Visit Vitals  BP 107/76 (BP 1 Location: Left arm, BP Patient Position: Sitting, BP Cuff Size: Large adult)   Pulse 83   Temp 97.7 F (36.5 C) (Temporal)   Resp 17   Ht 5' 1 (1.549 m)   Wt 96 kg (211 lb 9.6 oz)   SpO2 99%   BMI 39.98 kg/m     Prior to Admission medications    Medication Sig Start Date End Date Taking? Authorizing Provider   dextroamphetamine -amphetamine  (ADDERALL) 5 mg tablet Take 1 Tablet by mouth two (2) times a day. Max Daily Amount: 10 mg. 12/02/19  Yes Latham-Solomon, Vicki B, MD   cetirizine (Allergy Relief, cetirizine,) 10 mg tablet TAKE 1 TABLET BY MOUTH EVERY DAY 10/23/19  Yes Latham-Solomon, Vicki B, MD   metFORMIN ER (GLUCOPHAGE XR) 500 mg tablet TAKE 2 TABLETS BY MOUTH EVERY DAY 10/23/19  Yes Latham-Solomon, Vicki B, MD   escitalopram  oxalate (LEXAPRO ) 20 mg tablet TAKE 1 TABLET BY MOUTH EVERY DAY 10/23/19  Yes Latham-Solomon, Vicki B, MD   cholecalciferol (VITAMIN D3) (50,000 UNITS /1250 MCG) capsule TAKE 1 CAP BY MOUTH EVERY SEVEN (7) DAYS. INDICATIONS: LOW VITAMIN D LEVELS 07/08/19  Yes Latham-Solomon, Vicki B, MD   levonorgestrel-ethinyl estradiol (Kurvelo, 28,) 0.15-0.03 mg tab Take 1 Tab by mouth daily. 05/13/19  Yes Latham-Solomon, Vicki B, MD   albuterol (PROVENTIL VENTOLIN) 2.5 mg /3 mL (0.083 %) nebu 3 mL by Nebulization route every four (4) hours as needed for Wheezing. 03/03/19  Yes Latham-Solomon, Vicki B, MD   LORazepam (ATIVAN) 0.5 mg tablet Take 0.5 mg by mouth. 06/05/17  Yes Provider, Historical   dextroamphetamine -amphetamine  (ADDERALL) 5 mg tablet Take 1 Tablet by mouth two (2) times a day. Max Daily Amount: 10 mg.  Patient not taking: Reported on 07/30/2019 06/29/19   Cleo Orie NOVAK, MD   ibuprofen  (MOTRIN ) 800 mg tablet Take 1 Tab by mouth every eight (8) hours as needed for Pain. Take with food or milk; report use over 3x a week  Patient not taking: Reported on 12/31/2019 08/27/18   Blondie Rosella, MD
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
## 2020-01-28 ENCOUNTER — Ambulatory Visit: Payer: BLUE CROSS/BLUE SHIELD | Attending: Psychiatric/Mental Health | Primary: Internal Medicine

## 2020-02-01 ENCOUNTER — Encounter: Payer: BLUE CROSS/BLUE SHIELD | Primary: Internal Medicine

## 2020-02-05 ENCOUNTER — Telehealth
Admit: 2020-02-05 | Discharge: 2020-02-05 | Payer: BLUE CROSS/BLUE SHIELD | Attending: Psychiatric/Mental Health | Primary: Internal Medicine

## 2020-02-05 ENCOUNTER — Telehealth: Attending: Psychiatric/Mental Health | Primary: Internal Medicine

## 2020-02-05 DIAGNOSIS — F411 Generalized anxiety disorder: Secondary | ICD-10-CM

## 2020-02-05 MED ORDER — AMPHETAMINE-DEXTROAMPHETAMINE 10 MG TAB
10 mg | ORAL_TABLET | Freq: Two times a day (BID) | ORAL | 0 refills | Status: DC
Start: 2020-02-05 — End: 2020-04-21

## 2020-02-05 MED ORDER — ESCITALOPRAM 20 MG TAB
20 mg | ORAL_TABLET | Freq: Every day | ORAL | 0 refills | Status: DC
Start: 2020-02-05 — End: 2020-06-17

## 2020-02-05 NOTE — Progress Notes (Signed)
CHIEF COMPLAINT:  Lauren Rocha is a 40 y.o. female and was seen today for follow-up of psychiatric condition and psychotropic medication management.     HPI:    Lauren Rocha reports the following psychiatric symptoms by hx:  depression and anxiety impaired focus.  Overall symptoms have been present for . Currently symptoms  are of moderate severity. The symptoms occur intermittently.  Pt reports medications are effective. Met with pt via video telehealth for appt today  to review current treatment plan. Patient consents to virtual appointment.     FAMILY/SOCIAL HX:   Psychosocial stressors       REVIEW OF SYSTEMS:  Psychiatric symptoms being monitored for:  depression, anxiety  Appetite:no change from normal   Sleep: good   Neuro: denies     There were no vitals taken for this visit.    Side Effects:  none    MENTAL STATUS EXAM:   Sensorium  oriented to time, place and person   Relations cooperative   Appearance:  age appropriate and within normal Limits   Motor Behavior:  within normal limits   Speech:  normal pitch and normal volume   Thought Process: within normal limits   Thought Content free of delusions and free of hallucinations   Suicidal ideations none   Homicidal ideations none   Mood:  euthymic   Affect:  euthymic   Memory recent  adequate   Memory remote:  adequate   Concentration:  adequate   Abstraction:  abstract   Insight:  good   Reliability good   Judgment:  good     MEDICAL DECISION MAKING:  Problems addressed today:    ICD-10-CM ICD-9-CM    1. GAD (generalized anxiety disorder)  F41.1 300.02    2. Attention deficit hyperactivity disorder (ADHD), combined type  F90.2 314.01    3. MDD (major depressive disorder), recurrent, in partial remission ([place])  F33.41 296.35            Assessment:   Lauren Rocha is responding to treatment. Symptoms are less in occurrence and less in severity. Patient reports doing better. She was exposed to COVID over the holiday and was ill, but is now feeling better.  Discussed  current medications and dosages. Discussed positive coping strategies, relationship boundaries, and self care. Reviewed treatment goals and target symptoms to monitor for. Pt denies SI/HI/AH/VH and delusions.     Plan:   1.    Current Outpatient Medications   Medication Sig Dispense Refill   ??? cetirizine (Allergy Relief, cetirizine,) 10 mg tablet TAKE 1 TABLET BY MOUTH EVERY DAY 90 Tablet 1   ??? metFORMIN ER (GLUCOPHAGE XR) 500 mg tablet TAKE 2 TABLETS BY MOUTH EVERY DAY 180 Tablet 1   ??? escitalopram oxalate (LEXAPRO) 20 mg tablet TAKE 1 TABLET BY MOUTH EVERY DAY 90 Tablet 1   ??? cholecalciferol (VITAMIN D3) (50,000 UNITS /1250 MCG) capsule TAKE 1 CAP BY MOUTH EVERY SEVEN (7) DAYS. INDICATIONS: LOW VITAMIN D LEVELS 8 Capsule 0   ??? levonorgestrel-ethinyl estradiol (Kurvelo, 28,) 0.15-0.03 mg tab Take 1 Tab by mouth daily. 90 Package 3   ??? albuterol (PROVENTIL VENTOLIN) 2.5 mg /3 mL (0.083 %) nebu 3 mL by Nebulization route every four (4) hours as needed for Wheezing. 30 Nebule 3   ??? ibuprofen (MOTRIN) 800 mg tablet Take 1 Tab by mouth every eight (8) hours as needed for Pain. Take with food or milk; report use over 3x a week (Patient not taking: Reported on 12/31/2019)  15 Tab 0   ??? LORazepam (ATIVAN) 0.5 mg tablet Take 0.5 mg by mouth.            medication changes made today: continue Lexapro 20 mg     2.  Counseling and coordination of care including instructions for treatment, risks/benefits, risk factor reduction and patient/family education. She agrees with the plan. Patient instructed to call with any side effects, questions or issues.     3.    Follow-up and Dispositions    ?? Return in about 3 months (around 05/05/2020) for management .         Lauren Rocha, was evaluated through a synchronous (real-time) audio-video encounter. The patient (or guardian if applicable) is aware that this is a billable service. Verbal consent to proceed has been obtained within the past 12 months. The visit was conducted pursuant  to the emergency declaration under the [place], [place] waiver authority and the [employer] and [employer] Act.  Patient identification was verified, and a caregiver was present when appropriate. The patient was located in a state where the provider was credentialed to provide care.       An electronic signature was used to authenticate this note.  -- Lauren Evener, NP      02/05/2020  Lauren Evener, NP

## 2020-03-16 ENCOUNTER — Encounter: Attending: Psychiatric/Mental Health | Primary: Internal Medicine

## 2020-04-21 ENCOUNTER — Encounter

## 2020-04-21 MED ORDER — AMPHETAMINE-DEXTROAMPHETAMINE 10 MG TAB
10 mg | ORAL_TABLET | Freq: Two times a day (BID) | ORAL | 0 refills | Status: DC
Start: 2020-04-21 — End: 2020-04-27

## 2020-04-21 NOTE — Progress Notes (Signed)
Adderall refill sent to pharmacy

## 2020-04-21 NOTE — Telephone Encounter (Signed)
Patient is requesting adderall refill.

## 2020-04-27 ENCOUNTER — Encounter

## 2020-04-27 MED ORDER — AMPHETAMINE-DEXTROAMPHETAMINE 10 MG TAB
10 mg | ORAL_TABLET | Freq: Two times a day (BID) | ORAL | 0 refills | Status: DC
Start: 2020-04-27 — End: 2020-05-21

## 2020-04-27 NOTE — Progress Notes (Signed)
Original Pharmacy was out of stock of Adderall. Resent to:     [id number] - [place], [place] - [address]   Lauren Rocha, [place] [address]   Phone:  [phone] ??Fax:  [phone]

## 2020-04-27 NOTE — Telephone Encounter (Signed)
[facility] [address] [facility].  Please send to [facility].

## 2020-05-21 ENCOUNTER — Ambulatory Visit: Payer: BLUE CROSS/BLUE SHIELD | Attending: Psychiatric/Mental Health | Primary: Internal Medicine

## 2020-05-21 ENCOUNTER — Telehealth
Admit: 2020-05-21 | Discharge: 2020-05-21 | Payer: BLUE CROSS/BLUE SHIELD | Attending: Psychiatric/Mental Health | Primary: Internal Medicine

## 2020-05-21 ENCOUNTER — Telehealth: Attending: Psychiatric/Mental Health | Primary: Internal Medicine

## 2020-05-21 DIAGNOSIS — F3341 Major depressive disorder, recurrent, in partial remission: Secondary | ICD-10-CM

## 2020-05-21 MED ORDER — AMPHETAMINE-DEXTROAMPHETAMINE 10 MG TAB
10 mg | ORAL_TABLET | Freq: Two times a day (BID) | ORAL | 0 refills | Status: DC
Start: 2020-05-21 — End: 2020-08-10

## 2020-05-21 NOTE — Progress Notes (Signed)
CHIEF COMPLAINT:  Lauren Rocha is a 40 y.o. female and was seen today for follow-up of psychiatric condition and psychotropic medication management.     HPI:    Lauren Rocha reports the following psychiatric symptoms by hx:  depression and anxiety impaired focus.  Overall symptoms have been present for . Currently symptoms  are of moderate severity. The symptoms occur intermittently.  Pt reports medications are effective. Met with pt via video telehealth for appt today  to review current treatment plan. Patient consents to virtual appointment.       FAMILY/SOCIAL HX:   Psychosocial Stressors     REVIEW OF SYSTEMS:  Psychiatric symptoms being monitored for:  depression, ADHD, anxiety   Appetite:good   Sleep: good   Neuro: denies     There were no vitals taken for this visit.    Side Effects:  none    MENTAL STATUS EXAM:   Sensorium  oriented to time, place and person   Relations cooperative   Appearance:  age appropriate and within normal Limits   Motor Behavior:  within normal limits   Speech:  normal pitch and normal volume   Thought Process: within normal limits   Thought Content free of delusions and free of hallucinations   Suicidal ideations none   Homicidal ideations none   Mood:  euthymic   Affect:  euthymic   Memory recent  adequate   Memory remote:  adequate   Concentration:  adequate   Abstraction:  abstract   Insight:  good   Reliability good   Judgment:  good     MEDICAL DECISION MAKING:  Problems addressed today:    ICD-10-CM ICD-9-CM    1. MDD (major depressive disorder), recurrent, in partial remission ([place])  F33.41 296.35    2. Attention deficit hyperactivity disorder (ADHD), combined type  F90.2 314.01 dextroamphetamine-amphetamine (ADDERALL) 10 mg tablet   3. GAD (generalized anxiety disorder)  F41.1 300.02            Assessment:   Lauren Rocha is responding to treatment. Symptoms are less in severity and less in occurrence. Patient reports she ran out of Lexapro and immediately noticed an exacerbation  of symptoms. She has restarted and her symptoms have improved. Overall she reports she is doing well. Discussed positive coping strategies, psychotherapy, referral given for [employer].  Discussed current medications and dosages. Will continue with current treatment plan. Reviewed treatment goals and target symptoms to monitor for. Denies SI/HI/AH/VH and delusions.     Plan:   1.    Current Outpatient Medications   Medication Sig Dispense Refill   ??? dextroamphetamine-amphetamine (ADDERALL) 10 mg tablet Take 1 Tablet by mouth two (2) times a day for 30 days. Max Daily Amount: 20 mg. 60 Tablet 0   ??? cetirizine (Allergy Relief, cetirizine,) 10 mg tablet TAKE 1 TABLET BY MOUTH EVERY DAY 90 Tablet 1   ??? metFORMIN ER (GLUCOPHAGE XR) 500 mg tablet TAKE 2 TABLETS BY MOUTH EVERY DAY 180 Tablet 1   ??? cholecalciferol (VITAMIN D3) (50,000 UNITS /1250 MCG) capsule TAKE 1 CAP BY MOUTH EVERY SEVEN (7) DAYS. INDICATIONS: LOW VITAMIN D LEVELS 8 Capsule 0   ??? levonorgestrel-ethinyl estradiol (Kurvelo, 28,) 0.15-0.03 mg tab Take 1 Tab by mouth daily. 90 Package 3   ??? albuterol (PROVENTIL VENTOLIN) 2.5 mg /3 mL (0.083 %) nebu 3 mL by Nebulization route every four (4) hours as needed for Wheezing. 30 Nebule 3   ??? ibuprofen (MOTRIN) 800 mg tablet Take  1 Tab by mouth every eight (8) hours as needed for Pain. Take with food or milk; report use over 3x a week (Patient not taking: Reported on 12/31/2019) 15 Tab 0   ??? LORazepam (ATIVAN) 0.5 mg tablet Take 0.5 mg by mouth.            medication changes made today: Cont Lexapro 20 mg po daily, Cont Adderall 10 mg po BID    2.  Counseling and coordination of care including instructions for treatment, risks/benefits, risk factor reduction and patient/family education. She agrees with the plan. Patient instructed to call with any side effects, questions or issues.       Lauren Rocha, was evaluated through a synchronous (real-time) audio-video encounter. The patient (or guardian if  applicable) is aware that this is a billable service, which includes applicable co-pays. Verbal consent to proceed has been obtained. The visit was conducted pursuant to the emergency declaration under the [place], [place] waiver authority and the [employer] and [employer] Act.  Patient identification was verified, and a caregiver was present when appropriate. The patient was located at home in a state where the provider was licensed to provide care.       An electronic signature was used to authenticate this note.  -- Alcide Evener, NP     05/21/2020

## 2020-06-16 ENCOUNTER — Encounter

## 2020-06-17 MED ORDER — ESCITALOPRAM 20 MG TAB
20 mg | ORAL_TABLET | ORAL | 0 refills | Status: DC
Start: 2020-06-17 — End: 2020-09-16

## 2020-06-21 MED ORDER — METFORMIN SR 500 MG 24 HR TABLET
500 mg | ORAL_TABLET | ORAL | 1 refills | Status: AC
Start: 2020-06-21 — End: ?

## 2020-06-21 MED ORDER — ALTAVERA (28) 0.15 MG-0.03 MG TABLET
ORAL_TABLET | ORAL | 2 refills | Status: AC
Start: 2020-06-21 — End: ?

## 2020-08-10 ENCOUNTER — Encounter

## 2020-08-10 MED ORDER — AMPHETAMINE-DEXTROAMPHETAMINE 10 MG TAB
10 mg | ORAL_TABLET | Freq: Two times a day (BID) | ORAL | 0 refills | Status: DC
Start: 2020-08-10 — End: 2020-09-16

## 2020-08-10 NOTE — Telephone Encounter (Signed)
Pt made a fu appt for 7/20  Last appt 4/22      Requested rf on Adderall please- unable to pull med from list due to possibly having an end date

## 2020-08-18 ENCOUNTER — Ambulatory Visit: Attending: Psychiatric/Mental Health | Primary: Internal Medicine

## 2020-08-27 ENCOUNTER — Ambulatory Visit: Payer: BLUE CROSS/BLUE SHIELD | Attending: Psychiatric/Mental Health | Primary: Internal Medicine

## 2020-09-08 DIAGNOSIS — S93492A Sprain of other ligament of left ankle, initial encounter: Secondary | ICD-10-CM | POA: Insufficient documentation

## 2020-09-16 ENCOUNTER — Ambulatory Visit
Admit: 2020-09-16 | Discharge: 2020-09-16 | Payer: BLUE CROSS/BLUE SHIELD | Attending: Psychiatric/Mental Health | Primary: Internal Medicine

## 2020-09-16 ENCOUNTER — Ambulatory Visit: Attending: Psychiatric/Mental Health | Primary: Internal Medicine

## 2020-09-16 DIAGNOSIS — F3341 Major depressive disorder, recurrent, in partial remission: Secondary | ICD-10-CM

## 2020-09-16 MED ORDER — AMPHETAMINE-DEXTROAMPHETAMINE 10 MG TAB
10 mg | ORAL_TABLET | Freq: Two times a day (BID) | ORAL | 0 refills | Status: AC
Start: 2020-09-16 — End: 2020-11-15

## 2020-09-16 MED ORDER — LORAZEPAM 0.5 MG TAB
0.5 mg | ORAL_TABLET | Freq: Two times a day (BID) | ORAL | 2 refills | Status: AC | PRN
Start: 2020-09-16 — End: ?

## 2020-09-16 MED ORDER — ESCITALOPRAM 20 MG TAB
20 mg | ORAL_TABLET | Freq: Every day | ORAL | 2 refills | Status: DC
Start: 2020-09-16 — End: 2020-11-15

## 2020-09-16 NOTE — Progress Notes (Signed)
 Chief Complaint   Patient presents with    Medication Management     Visit Vitals  BP 120/88 (BP 1 Location: Right upper arm, BP Patient Position: Sitting, BP Cuff Size: Adult)   Pulse 92   Temp 97.5 F (36.4 C) (Temporal)   Resp 17   Ht 5' 2 (1.575 m)   Wt 100.2 kg (221 lb)   SpO2 99%   BMI 40.42 kg/m   Provider to review medication    3 most recent PHQ Screens 05/21/2020   Little interest or pleasure in doing things Not at all   Feeling down, depressed, irritable, or hopeless Not at all   Total Score PHQ 2 0     1. Have you been to the ER, urgent care clinic since your last visit?  Hospitalized since your last visit?[facility].  Sprain - left ankle.  June 2022.    2. Have you seen or consulted any other health care providers outside of the [facility] since your last visit?  Include any pap smears or colon screening. [facility].  09/08/20

## 2020-09-16 NOTE — Progress Notes (Signed)
CHIEF COMPLAINT:  Lauren Rocha is a 40 y.o. female and was seen today for follow-up of psychiatric condition and psychotropic medication management.     HPI:    Lauren Rocha reports the following psychiatric symptoms by hx:  depression and anxiety impaired focus.  Overall symptoms have been present for . Currently symptoms are of low to moderate severity. The symptoms occur intermittently.  Pt reports medications are effective. Met with pt for appt today to review current treatment plan.     FAMILY/SOCIAL HX:   Psychosocial Stressors      REVIEW OF SYSTEMS:  Psychiatric symptoms being monitored for:  depression, ADHD, anxiety   Appetite:good   Sleep: good   Neuro: denies     There were no vitals taken for this visit.    Side Effects:  none    MENTAL STATUS EXAM:   Sensorium  oriented to time, place and person   Relations cooperative   Appearance:  age appropriate and within normal Limits   Motor Behavior:  within normal limits   Speech:  normal pitch and normal volume   Thought Process: within normal limits   Thought Content free of delusions and free of hallucinations   Suicidal ideations none   Homicidal ideations none   Mood:  euthymic   Affect:  euthymic   Memory recent  adequate   Memory remote:  adequate   Concentration:  adequate   Abstraction:  abstract   Insight:  good   Reliability good   Judgment:  good     MEDICAL DECISION MAKING:  Problems addressed today:    ICD-10-CM ICD-9-CM    1. MDD (major depressive disorder), recurrent, in partial remission ([place])  F33.41 296.35       2. Attention deficit hyperactivity disorder (ADHD), combined type  F90.2 314.01 dextroamphetamine-amphetamine (ADDERALL) 10 mg tablet      3. GAD (generalized anxiety disorder)  F41.1 300.02 LORazepam (ATIVAN) 0.5 mg tablet      4. Anxiety  F41.9 300.00 escitalopram oxalate (LEXAPRO) 20 mg tablet        Assessment:   Lauren Rocha is responding to treatment. Symptoms are improving. Patient is doing well. She is currently going back to  school for Doctorate in [place]. She is also transitioning to another job. She report occasional anxiety attacks, but only uses Ativan sparingly. Patient aware of benzo safety and agreement.   Discussed current medications and dosages. No changes made. Reinforced positive coping strategies. Reviewed treatment goals and target symptoms to monitor for. Will continue to monitor.     Plan:   1.    Current Outpatient Medications   Medication Sig Dispense Refill    escitalopram oxalate (LEXAPRO) 20 mg tablet Take 1 Tablet by mouth daily. 90 Tablet 2    dextroamphetamine-amphetamine (ADDERALL) 10 mg tablet Take 1 Tablet by mouth two (2) times a day. Max Daily Amount: 20 mg. 60 Tablet 0    LORazepam (ATIVAN) 0.5 mg tablet Take 1 Tablet by mouth two (2) times daily as needed for Anxiety. Max Daily Amount: 1 mg. 30 Tablet 2    Altavera, 28, 0.15-0.03 mg tab TAKE 1 TABLET BY MOUTH EVERY DAY 84 Tablet 2    metFORMIN ER (GLUCOPHAGE XR) 500 mg tablet TAKE 2 TABLETS BY MOUTH EVERY DAY 180 Tablet 1    cetirizine (Allergy Relief, cetirizine,) 10 mg tablet TAKE 1 TABLET BY MOUTH EVERY DAY 90 Tablet 1    cholecalciferol (VITAMIN D3) (50,000 UNITS /1250 MCG) capsule TAKE 1 CAP  BY MOUTH EVERY SEVEN (7) DAYS. INDICATIONS: LOW VITAMIN D LEVELS 8 Capsule 0    albuterol (PROVENTIL VENTOLIN) 2.5 mg /3 mL (0.083 %) nebu 3 mL by Nebulization route every four (4) hours as needed for Wheezing. 30 Nebule 3    ibuprofen (MOTRIN) 800 mg tablet Take 1 Tab by mouth every eight (8) hours as needed for Pain. Take with food or milk; report use over 3x a week (Patient not taking: Reported on 12/31/2019) 15 Tab 0          medication changes made today: Continue Adderall 10 mg twice daily, continue Ativan 0.5 mg twice daily as needed.     2.  Counseling and coordination of care including instructions for treatment, risks/benefits, risk factor reduction and patient/family education. She agrees with the plan. Patient instructed to call with any side effects,  questions or issues.            09/16/2020  Alcide Evener, NP

## 2020-11-15 ENCOUNTER — Encounter

## 2020-11-16 MED ORDER — ESCITALOPRAM 20 MG TAB
20 mg | ORAL_TABLET | Freq: Every day | ORAL | 2 refills | Status: AC
Start: 2020-11-16 — End: ?

## 2020-11-16 MED ORDER — AMPHETAMINE-DEXTROAMPHETAMINE 10 MG TAB
10 mg | ORAL_TABLET | Freq: Two times a day (BID) | ORAL | 0 refills | Status: AC
Start: 2020-11-16 — End: 2021-02-22

## 2021-02-22 ENCOUNTER — Encounter

## 2021-02-23 NOTE — Telephone Encounter (Signed)
Last visit: 08.18.22  Next visit: 02.26.23    PMP:  12/14/2020 11/16/2020 1 Dextroamp-Amphetamin 10 Mg Tab 60.00 30 [facility]

## 2021-02-24 MED ORDER — AMPHETAMINE-DEXTROAMPHETAMINE 10 MG TAB
10 mg | ORAL_TABLET | Freq: Two times a day (BID) | ORAL | 0 refills | Status: DC
Start: 2021-02-24 — End: 2021-02-28

## 2021-02-25 ENCOUNTER — Encounter

## 2021-02-28 NOTE — Telephone Encounter (Signed)
Last visit: 09/16/2020   Next visit: 03/17/2021      [facility] [address] [facility] [address] [facility] of stock.

## 2021-02-28 NOTE — Telephone Encounter (Signed)
Requested Prescriptions     Pending Prescriptions Disp Refills    dextroamphetamine-amphetamine (ADDERALL) 10 mg tablet 60 Tablet 0     Sig: Take 1 Tablet by mouth two (2) times a day. Max Daily Amount: 20 mg.     Refused Prescriptions Disp Refills    dextroamphetamine-amphetamine (ADDERALL) 10 mg tablet 60 Tablet 0     Sig: Take 1 Tablet by mouth two (2) times a day. Max Daily Amount: 20 mg.     Refused By: Lacey Jensen     Reason for Refusal: Request already responded to by other means (e.g. phone or fax)      Last visit: 09/16/2020   Next visit: 03/17/2021     Patient is [facility] [address] [facility] [address] is out of stock.

## 2021-02-28 NOTE — Telephone Encounter (Signed)
Refilled 02/24/21

## 2021-03-01 MED ORDER — AMPHETAMINE-DEXTROAMPHETAMINE 10 MG TAB
10 mg | ORAL_TABLET | Freq: Two times a day (BID) | ORAL | 0 refills | Status: DC
Start: 2021-03-01 — End: 2021-03-04

## 2021-03-04 ENCOUNTER — Encounter

## 2021-03-07 NOTE — Telephone Encounter (Signed)
Original script sent 02/28/21. Pharmacy was out of stock.  Patient confirmed new pharmacy has 10mg  in stock )

## 2021-03-08 ENCOUNTER — Encounter

## 2021-03-08 MED ORDER — AMPHETAMINE-DEXTROAMPHETAMINE 10 MG TAB
10 mg | ORAL_TABLET | Freq: Two times a day (BID) | ORAL | 0 refills | Status: DC
Start: 2021-03-08 — End: 2021-03-17

## 2021-03-08 MED ORDER — AMPHETAMINE-DEXTROAMPHETAMINE 10 MG TAB
10 mg | ORAL_TABLET | Freq: Two times a day (BID) | ORAL | 0 refills | Status: DC
Start: 2021-03-08 — End: 2021-03-08

## 2021-03-08 NOTE — Progress Notes (Signed)
Prescription sent to: [id number] - [place], [place] - [address] AT  [facility] [place] [address]   [address], [place] [address]

## 2021-03-17 ENCOUNTER — Ambulatory Visit
Admit: 2021-03-17 | Discharge: 2021-03-17 | Payer: PRIVATE HEALTH INSURANCE | Attending: Psychiatric/Mental Health | Primary: Internal Medicine

## 2021-03-17 ENCOUNTER — Ambulatory Visit: Attending: Psychiatric/Mental Health | Primary: Internal Medicine

## 2021-03-17 DIAGNOSIS — F3341 Major depressive disorder, recurrent, in partial remission: Secondary | ICD-10-CM

## 2021-03-17 MED ORDER — METHYLPHENIDATE SR 18 MG 24 HR TAB
18 mg | ORAL_TABLET | Freq: Every day | ORAL | 0 refills | Status: AC
Start: 2021-03-17 — End: ?

## 2021-03-17 NOTE — Progress Notes (Signed)
 Chief Complaint   Patient presents with    Medication Management     Visit Vitals  BP (!) 138/98 (BP 1 Location: Left upper arm, BP Patient Position: Sitting, BP Cuff Size: Large adult)   Pulse 79   Temp 98.6 F (37 C) (Oral)   Resp 17   Ht 5' 1 (1.549 m)   Wt 99 kg (218 lb 3.2 oz)   SpO2 99%   BMI 41.23 kg/m     Patient states she recently start on new bp medication. Monitored by PCP, next follow up is  in three months.  Patient will follow up sooner. Patient states she has been very stressed at work.  1. Have you been to the ER, urgent care clinic since your last visit?  Hospitalized since your last visit?No    2. Have you seen or consulted any other health care providers outside of the [facility] since your last visit?  Include any pap smears or colon screening. No
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
## 2021-04-21 ENCOUNTER — Ambulatory Visit: Payer: PRIVATE HEALTH INSURANCE | Attending: Psychiatric/Mental Health | Primary: Internal Medicine

## 2021-05-12 ENCOUNTER — Ambulatory Visit: Payer: PRIVATE HEALTH INSURANCE | Attending: Psychiatric/Mental Health | Primary: Internal Medicine

## 2021-06-15 ENCOUNTER — Encounter

## 2021-06-15 MED ORDER — AMPHETAMINE-DEXTROAMPHETAMINE 10 MG PO TABS
10 MG | ORAL_TABLET | Freq: Two times a day (BID) | ORAL | 0 refills | Status: AC
Start: 2021-06-15 — End: 2021-07-15

## 2021-06-15 NOTE — Telephone Encounter (Signed)
Next visit: 07.05.23    She would like to stick with the Adderall. [facility] [place]     PMP:  Last fill: 04/20/2021   Wrote: 02/24/2021 2   Dextroamp-Amphetamin 10 Mg Tab  Qty: 60.00   Day Supply: 30   [facility]    03/17/2021   03/17/2021 2   Methylphenidate Er 18 Mg Tab  30.00   30   [facility]

## 2021-07-26 ENCOUNTER — Encounter

## 2021-07-26 MED ORDER — AMPHETAMINE-DEXTROAMPHETAMINE 10 MG PO TABS
10 MG | ORAL_TABLET | Freq: Two times a day (BID) | ORAL | 0 refills | Status: DC
Start: 2021-07-26 — End: 2021-11-21

## 2021-07-26 NOTE — Telephone Encounter (Signed)
Patient confirmed Adderall 10 mg tablet in stocked at preferred pharmacy.     [place]:  Written:06/15/2021   Filled:06/15/2021 2  Drug:Dextroamp-Amphetamin 10 Mg Tab  QTY:60.00   Days:30   Provider:[facility]    Last visit:03/17/21  Next visit:0705/23    [id number] [place], [place] - [address] - P [phone] - F [phone]

## 2021-08-03 ENCOUNTER — Telehealth
Admit: 2021-08-03 | Discharge: 2021-08-03 | Payer: PRIVATE HEALTH INSURANCE | Attending: Psychiatric/Mental Health | Primary: Internal Medicine

## 2021-08-03 DIAGNOSIS — F3341 Major depressive disorder, recurrent, in partial remission: Secondary | ICD-10-CM

## 2021-08-03 MED ORDER — ESCITALOPRAM OXALATE 20 MG PO TABS
20 MG | ORAL_TABLET | Freq: Every day | ORAL | 5 refills | Status: DC
Start: 2021-08-03 — End: 2021-11-21

## 2021-08-03 NOTE — Progress Notes (Signed)
CHIEF COMPLAINT:  Lauren Rocha is a 41 y.o. female and was seen today for follow-up of psychiatric condition and psychotropic medication management.     HPI:    Analina reports the following psychiatric symptoms by hx:  depression and anxiety impaired focus.  Overall symptoms have been present for years. Currently symptoms are of moderate severity. The symptoms occur intermittently. Patient reports medications beneficial. ADHD, anxiety and Depression symptoms have improved. Met with pt via telehealth video for appt today to review current treatment plan.      FAMILY/SOCIAL HX:   Finances and work  She is moving     REVIEW OF SYSTEMS:  Psychiatric symptoms being monitored for:  depression, ADHD, anxiety   Appetite:good   Sleep: good   Neuro: denies     There were no vitals taken for this visit.    Side Effects:  none    MENTAL STATUS EXAM:   Sensorium  oriented to time, place and person   Relations cooperative   Appearance:  age appropriate and within normal Limits   Motor Behavior:  within normal limits   Speech:  normal pitch and normal volume   Thought Process: within normal limits   Thought Content free of delusions and free of hallucinations   Suicidal ideations none   Homicidal ideations none   Mood:  Anxious     Affect:  normal    Memory recent  adequate   Memory remote:  adequate   Concentration:  adequate   Abstraction:  abstract   Insight:  good   Reliability good   Judgment:  good         MEDICAL DECISION MAKING:  Problems addressed today:   Diagnosis Orders   1. Major depressive disorder, recurrent, in partial remission ([facility])  escitalopram (LEXAPRO) 20 MG tablet        Assessment:   Lauren Rocha is responding to treatment. Symptoms are improved.. She is starting a new position soon and a new job. Reinforced positive coping strategies. Discussed current medications and dosages. No changes made today. Discussed transition of care. Reviewed treatment goals and target symptoms to monitor for.     Plan:   1.     Current Outpatient Medications   Medication Sig Dispense Refill    escitalopram (LEXAPRO) 20 MG tablet Take 1 tablet by mouth daily 30 tablet 5    amphetamine-dextroamphetamine (ADDERALL) 10 MG tablet Take 1 tablet by mouth 2 times daily for 30 days. Max Daily Amount: 2 tablets 60 tablet 0    albuterol (PROVENTIL) (2.5 MG/3ML) 0.083% nebulizer solution Inhale 3 mLs into the lungs every 4 hours as needed      cetirizine (ZYRTEC) 10 MG tablet Take 1 tablet by mouth daily      vitamin D (CHOLECALCIFEROL) 50000 UNIT CAPS Take 1 capsule by mouth every 7 days      ibuprofen (ADVIL;MOTRIN) 800 MG tablet Take 1 tablet by mouth every 8 hours as needed      levonorgestrel-ethinyl estradiol (ALTAVERA) 0.15-30 MG-MCG per tablet Take 1 tablet by mouth daily      LORazepam (ATIVAN) 0.5 MG tablet Take 1 tablet by mouth 2 times daily as needed. Max Daily Amount: 1 mg      metFORMIN (GLUCOPHAGE-XR) 500 MG extended release tablet Take 2 tablets by mouth daily      Methylphenidate HCl ER 18 MG TB24 Take 18 mg by mouth daily. Max Daily Amount: 18 mg       No current facility-administered  medications for this visit.          medication changes made today: No changes made today, continue Adderall 10 mg po BID, continue Lexapro 20 mg po daily    2.  Counseling and coordination of care including instructions for treatment, risks/benefits, risk factor reduction and patient/family education. She agrees with the plan. Patient instructed to call with any side effects, questions or issues.     [name], was evaluated through a synchronous (real-time) audio-video encounter. The patient (or guardian if applicable) is aware that this is a billable service, which includes applicable co-pays. This Virtual Visit was conducted with patient's (and/or legal guardian's) consent. Patient identification was verified, and a caregiver was present when appropriate.   The patient was located at Home: [address]  [place] [address]  [address]  Provider was located at [facility] (Appt Dept): [address]  [place] [address]          08/03/2021  Alcide Evener, APRN - NP

## 2021-08-25 ENCOUNTER — Encounter

## 2021-08-25 MED ORDER — AMPHETAMINE-DEXTROAMPHETAMINE 10 MG PO TABS
10 MG | ORAL_TABLET | Freq: Two times a day (BID) | ORAL | 0 refills | Status: DC
Start: 2021-08-25 — End: 2021-11-21

## 2021-08-25 NOTE — Telephone Encounter (Signed)
Returned patient call. Spoke to patient. Patient stated she would call back to schedule appt .

## 2021-08-25 NOTE — Telephone Encounter (Signed)
Pt called requesting a RF on Adderrall.  Last seen 08/03/2021- no fu at this time

## 2021-08-25 NOTE — Telephone Encounter (Signed)
Left vm for patient to return call. Asked patient to schedule with  new provider, which pharmacy to send medications, and confirm if pharmacy has it in stock.

## 2021-11-04 ENCOUNTER — Ambulatory Visit: Payer: BLUE CROSS/BLUE SHIELD | Attending: Psychiatric/Mental Health | Primary: Internal Medicine

## 2021-11-18 ENCOUNTER — Ambulatory Visit: Payer: BLUE CROSS/BLUE SHIELD | Attending: Psychiatric/Mental Health | Primary: Internal Medicine

## 2021-11-21 ENCOUNTER — Ambulatory Visit
Admit: 2021-11-21 | Discharge: 2021-11-21 | Payer: BLUE CROSS/BLUE SHIELD | Attending: Psychiatric/Mental Health | Primary: Internal Medicine

## 2021-11-21 DIAGNOSIS — F411 Generalized anxiety disorder: Secondary | ICD-10-CM

## 2021-11-21 MED ORDER — ESCITALOPRAM OXALATE 20 MG PO TABS
20 MG | ORAL_TABLET | Freq: Every day | ORAL | 2 refills | Status: DC
Start: 2021-11-21 — End: 2022-01-17

## 2021-11-21 MED ORDER — AMPHETAMINE-DEXTROAMPHETAMINE 10 MG PO TABS
10 MG | ORAL_TABLET | Freq: Two times a day (BID) | ORAL | 0 refills | Status: AC
Start: 2021-11-21 — End: 2021-12-21
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
## 2021-11-21 NOTE — Progress Notes (Signed)
Chief Complaint   Patient presents with    New Patient     Former [facility] pt     Vitals:    11/21/21 1317   BP: 123/89   Pulse: 85   Resp: 16   Temp: 98.4 F (36.9 C)   SpO2: 100%     Prior to Admission medications    Medication Sig Start Date End Date Taking? Authorizing Provider   amphetamine-dextroamphetamine (ADDERALL, 10MG ,) 10 MG tablet Take 1 tablet by mouth 2 times daily for 30 days. Max Daily Amount: 20 mg 10/26/21 11/25/21 Yes Ntlabati, Karmen Bongo, APRN - NP   escitalopram (LEXAPRO) 20 MG tablet Take 1 tablet by mouth daily 08/03/21  Yes Ntlabati, Latoya R, APRN - NP   albuterol (PROVENTIL) (2.5 MG/3ML) 0.083% nebulizer solution Inhale 3 mLs into the lungs every 4 hours as needed 03/03/19  Yes Automatic Reconciliation, Ar   cetirizine (ZYRTEC) 10 MG tablet Take 1 tablet by mouth daily 10/23/19  Yes Automatic Reconciliation, Ar   vitamin D (CHOLECALCIFEROL) 50000 UNIT CAPS Take 1 capsule by mouth every 7 days 07/08/19  Yes Automatic Reconciliation, Ar   ibuprofen (ADVIL;MOTRIN) 800 MG tablet Take 1 tablet by mouth every 8 hours as needed 08/27/18  Yes Automatic Reconciliation, Ar   levonorgestrel-ethinyl estradiol (ALTAVERA) 0.15-30 MG-MCG per tablet Take 1 tablet by mouth daily 06/21/20  Yes Automatic Reconciliation, Ar   LORazepam (ATIVAN) 0.5 MG tablet Take 1 tablet by mouth 2 times daily as needed. 09/16/20  Yes Automatic Reconciliation, Ar   metFORMIN (GLUCOPHAGE-XR) 500 MG extended release tablet Take 2 tablets by mouth daily 06/21/20  Yes Automatic Reconciliation, Ar   Methylphenidate HCl ER 18 MG TB24 Take 18 mg by mouth daily. Max Daily Amount: 18 mg 03/17/21  Yes Automatic Reconciliation, Ar   amphetamine-dextroamphetamine (ADDERALL, 10MG ,) 10 MG tablet Take 1 tablet by mouth 2 times daily for 30 days. Max Daily Amount: 20 mg 08/25/21 09/24/21  Ntlabati, Latoya R, APRN - NP   amphetamine-dextroamphetamine (ADDERALL, 10MG ,) 10 MG tablet Take 1 tablet by mouth 2 times daily for 30 days. Max Daily Amount: 20 mg  09/25/21 10/25/21  Ntlabati, Latoya R, APRN - NP   amphetamine-dextroamphetamine (ADDERALL) 10 MG tablet Take 1 tablet by mouth 2 times daily for 30 days. Max Daily Amount: 2 tablets 07/26/21 08/25/21  Ntlabati, Karmen Bongo, APRN - NP     PHQ-9 Total Score: 9 (11/21/2021  1:16 PM)  Thoughts that you would be better off dead, or of hurting yourself in some way: 0 (11/21/2021  1:16 PM)        11/21/2021     1:16 PM   GAD-7 SCREENING   Feeling nervous, anxious, or on edge More than half the days   Not being able to stop or control worrying More than half the days   Worrying too much about different things More than half the days   Trouble relaxing Several days   Being so restless that it is hard to sit still Several days   Becoming easily annoyed or irritable Several days   Feeling afraid as if something awful might happen Not at all   GAD-7 Total Score 9   How difficult have these problems made it for you to do your work, take care of things at home, or get along with other people? Somewhat difficult     1. Have you been to the ER, urgent care clinic since your last visit?  Hospitalized since your last visit?No  2. Have you seen or consulted any other health care providers outside of the [facility] since your last visit?  Include any pap smears or colon screening. No

## 2022-01-15 ENCOUNTER — Encounter

## 2022-01-17 ENCOUNTER — Encounter

## 2022-01-17 MED ORDER — ESCITALOPRAM OXALATE 20 MG PO TABS
20 MG | ORAL_TABLET | Freq: Every day | ORAL | 0 refills | Status: AC
Start: 2022-01-17 — End: 2022-02-20

## 2022-02-01 ENCOUNTER — Encounter

## 2022-02-01 NOTE — Telephone Encounter (Signed)
Patient is requesting a refill on following and wants a call back when it is sent to pharmacy                              amphetamine-dextroamphetamine (ADDERALL, 10MG ,) 10 MG tablet

## 2022-02-02 ENCOUNTER — Encounter

## 2022-02-02 MED ORDER — AMPHETAMINE-DEXTROAMPHETAMINE 10 MG PO TABS
10 MG | ORAL_TABLET | Freq: Two times a day (BID) | ORAL | 0 refills | Status: DC
Start: 2022-02-02 — End: 2022-02-20

## 2022-02-20 ENCOUNTER — Telehealth
Admit: 2022-02-20 | Discharge: 2022-02-20 | Payer: BLUE CROSS/BLUE SHIELD | Attending: Psychiatric/Mental Health | Primary: Internal Medicine

## 2022-02-20 DIAGNOSIS — F902 Attention-deficit hyperactivity disorder, combined type: Secondary | ICD-10-CM

## 2022-02-20 MED ORDER — AMPHETAMINE-DEXTROAMPHET ER 20 MG PO CP24
20 MG | ORAL_CAPSULE | Freq: Every morning | ORAL | 0 refills | Status: DC
Start: 2022-02-20 — End: 2022-03-08

## 2022-02-20 MED ORDER — ESCITALOPRAM OXALATE 20 MG PO TABS
20 MG | ORAL_TABLET | Freq: Every day | ORAL | 0 refills | Status: DC
Start: 2022-02-20 — End: 2022-04-24

## 2022-02-20 NOTE — Progress Notes (Signed)
Lauren Rocha, was evaluated through a synchronous (real-time) audio-video encounter. The patient (or guardian if applicable) is aware that this is a billable service, which includes applicable co-pays. This Virtual Visit was conducted with patient's (and/or legal guardian's) consent. Patient identification was verified, and a caregiver was present when appropriate.     The patient was located at Home: [address]  [place] [address]  Provider was located at [facility] [employer] [facility] (Appt Dept): [address]  [address]  [place],  [place] [address]    {STOP! Confirm you are appropriately licensed, registered, or certified to deliver care in the state where the patient is located as indicated above. If you are not or unsure, please re-schedule the visit.     Are you appropriately licensed in the patient's state?: Yes    CHIEF COMPLAINT:  Lauren Rocha is a 42 y.o. female and was seen today for follow-up of psychiatric condition and psychotropic medication management.     HPI:    Lauren Rocha reports the following psychiatric symptoms by hx:  depression, anxiety, inattentiveness.  Overall symptoms have been present for years. Currently symptoms are of low to moderate severity. The symptoms occur less frequently and are less severe. Patient reports work stress. Overall, she feels her depression and anxiety are mostly manageable. She rates her depression at 2/10 with 10 being the worst. Anxiety is rated at 6/10 with 10 being the worst. Energy level is good. Concentrate and ability to stay on task is good with medications. She reports being mostly compliant with medications. Patient reports having days where she doesn't remember to take her second dose of Adderall. She reports struggle to concentrate and stay organize when this happens. Denies side effects. No complaint of chest pain, dizziness, palpitations, headaches. Patient Appetite:no change from normal. She reports just starting Keto diet. Sleep: fair,  averaging about 5 hours. Patient denies symptoms of psychosis or mania. Denies suicidal or homicidal ideation.    Current Outpatient Medications on File Prior to Visit   Medication Sig Dispense Refill    amphetamine-dextroamphetamine (ADDERALL, 10MG ,) 10 MG tablet Take 1 tablet by mouth 2 times daily for 30 days. Max Daily Amount: 20 mg 60 tablet 0    escitalopram (LEXAPRO) 20 MG tablet Take 1 tablet by mouth daily 30 tablet 0    albuterol (PROVENTIL) (2.5 MG/3ML) 0.083% nebulizer solution Inhale 3 mLs into the lungs every 4 hours as needed      cetirizine (ZYRTEC) 10 MG tablet Take 1 tablet by mouth daily      vitamin D (CHOLECALCIFEROL) 50000 UNIT CAPS Take 1 capsule by mouth every 7 days      ibuprofen (ADVIL;MOTRIN) 800 MG tablet Take 1 tablet by mouth every 8 hours as needed      levonorgestrel-ethinyl estradiol (ALTAVERA) 0.15-30 MG-MCG per tablet Take 1 tablet by mouth daily      LORazepam (ATIVAN) 0.5 MG tablet Take 1 tablet by mouth 2 times daily as needed.      metFORMIN (GLUCOPHAGE-XR) 500 MG extended release tablet Take 2 tablets by mouth daily       No current facility-administered medications on file prior to visit.        Past Medical History:   Diagnosis Date    Anxiety     PCOS (polycystic ovarian syndrome)     Sciatica         Family History   Problem Relation Age of Onset    Cancer Maternal Grandmother  breast    Diabetes Maternal Grandmother     Breast Cancer Maternal Grandmother     Cancer Maternal Aunt         breast          Social History     Socioeconomic History    Marital status: Divorced     Spouse name: Not on file    Number of children: Not on file    Years of education: Not on file    Highest education level: Not on file   Occupational History    Not on file   Tobacco Use    Smoking status: Never    Smokeless tobacco: Never   Substance and Sexual Activity    Alcohol use: Yes    Drug use: Not on file    Sexual activity: Not on file   Other Topics Concern    Not on file   Social  History Narrative    Not on file     Social Determinants of Health     Financial Resource Strain: Not on file   Food Insecurity: Not on file   Transportation Needs: Not on file   Physical Activity: Not on file   Stress: Not on file   Social Connections: Not on file   Intimate Partner Violence: Not on file   Housing Stability: Not on file       REVIEW OF SYSTEMS:  Psychiatric symptoms being monitored for:  depression, anxiety, inattentiveness  Constitutional: No report of fever, or weight gain.  Eyes: No report of acute vision changes.   ENT: No report of hearing changes or difficulty swallowing.   Cardiac: No report of chest pain, edema or orthopnea.   Respiratory: Denies dyspnea, cough or wheeze.   GI: No report of abdominal pain.   GU: No report of dysuria or hematuria.   Musculoskeletal: No report of joint pain or swelling.   Skin: No report of rash, lesion, or abrasions.   Neurologic: No report of seizures, blackout, numbness.   Endocrine: No report of polyuria or polydipsia.   Hematologic: No report of blood clots or easy bleeding.   Allergy: No report of hives or allergic reaction.   Reproductive: No report of significant issues    There were no vitals taken for this visit.    MENTAL STATUS EXAM:   Appearance: Appears stated age. Good grooming.  Involuntary Movements: None  Attitude: Cooperative  Speech: normal  Language: patient able to repeat the sentence "no ifs ands or buts."  Mood:  "pretty good"  Affect: congruent with mood  Thought Process: Logical   Thought Content: no delusion or paranoid thoughts.  Suicidal/ Homicidal Ideations: denied  Thought Perception: no auditory or visual hallucinations  Orientation: alert, oriented to person, place and time.  Recent Memory: Adequate  Remote Memory: Adequate  Attention/Concentration: able to spell word "WORLD" backwards.  Insight/ Judgment: Good    [place]:      11/21/2021     1:16 PM   PHQ-9    Little interest or pleasure in doing things 0   Feeling down,  depressed, or hopeless 0   Trouble falling or staying asleep, or sleeping too much 3   Feeling tired or having little energy 1   Poor appetite or overeating 3   Feeling bad about yourself - or that you are a failure or have let yourself or your family down 1   Trouble concentrating on things, such as reading the newspaper or watching television  1   Moving or speaking so slowly that other people could have noticed. Or the opposite - being so fidgety or restless that you have been moving around a lot more than usual 0   Thoughts that you would be better off dead, or of hurting yourself in some way 0   PHQ-2 Score 0   PHQ-9 Total Score 9   If you checked off any problems, how difficult have these problems made it for you to do your work, take care of things at home, or get along with other people? 1              11/21/2021     1:16 PM   GAD-7 SCREENING   Feeling nervous, anxious, or on edge More than half the days   Not being able to stop or control worrying More than half the days   Worrying too much about different things More than half the days   Trouble relaxing Several days   Being so restless that it is hard to sit still Several days   Becoming easily annoyed or irritable Several days   Feeling afraid as if something awful might happen Not at all   GAD-7 Total Score 9   How difficult have these problems made it for you to do your work, take care of things at home, or get along with other people? Somewhat difficult        MEDICAL DECISION MAKING:  Problems addressed today:      ICD-10-CM    1. Attention-deficit hyperactivity disorder, combined type  F90.2       2. Major depressive disorder, recurrent, in partial remission (HCC)  F33.41       3. Generalized anxiety disorder  F41.1            Assessment:   Lauren Rocha  is responding to treatment. Symptoms are improving. Reviewed and discussed current medications and dosages. Mutually agreed to switch to Adderall XR 20 mg  to help with compliance and all day coverage.  Patient to communicate if symptoms worsen or fail to improve before their next scheduled visit. Risks, benefits, and side effects of medications to include drug to drug interactions were reviewed and discussed in detail. Patient agreed that the potential benefit from a medication trial outweighs the acknowledged risks. Reviewed treatment goals and target symptoms to monitor for.     Plan:   1. MEDICATION:        medication changes made today:     Current Outpatient Medications:     amphetamine-dextroamphetamine (ADDERALL, 10MG ,) 10 MG tablet, Take 1 tablet by mouth 2 times daily for 30 days. Max Daily Amount: 20 mg, Disp: 60 tablet, Rfl: 0    escitalopram (LEXAPRO) 20 MG tablet, Take 1 tablet by mouth daily, Disp: 30 tablet, Rfl: 0    albuterol (PROVENTIL) (2.5 MG/3ML) 0.083% nebulizer solution, Inhale 3 mLs into the lungs every 4 hours as needed, Disp: , Rfl:     cetirizine (ZYRTEC) 10 MG tablet, Take 1 tablet by mouth daily, Disp: , Rfl:     vitamin D (CHOLECALCIFEROL) 50000 UNIT CAPS, Take 1 capsule by mouth every 7 days, Disp: , Rfl:     ibuprofen (ADVIL;MOTRIN) 800 MG tablet, Take 1 tablet by mouth every 8 hours as needed, Disp: , Rfl:     levonorgestrel-ethinyl estradiol (ALTAVERA) 0.15-30 MG-MCG per tablet, Take 1 tablet by mouth daily, Disp: , Rfl:     LORazepam (ATIVAN) 0.5 MG tablet, Take 1 tablet  by mouth 2 times daily as needed., Disp: , Rfl:     metFORMIN (GLUCOPHAGE-XR) 500 MG extended release tablet, Take 2 tablets by mouth daily, Disp: , Rfl:     2.  Counseling and coordination of care including instructions for treatment, risks/benefits, risk factor reduction and patient/family education. She agrees with the plan. Patient instructed to call with any side effects, questions or issues.   3.PSYCHOTHERAPY: Patient was provided with supportive therapy, strongly encourage to seek psychotherapy.  4. MEDICAL CARE: Patient was strongly encourage to take their medical medications and follow up with their PCP  on regular basis.    5. SUBSTANCE ABUSE CARE: Patient denies a smoking, drinking, abusing any illicit drugs.  6. Patient was informed that in case of emergency to go to the nearest ER or call 911.     Patient was given time to ask questions and voice concerns. I believe all questions concerns were adequately addressed at this office visit. Patient verbalized agreement and understanding of the above-stated plan.    Follow-up and Dispositions    Return in about 3 months (around 05/22/2022).       02/20/2022  Constance Holster, APRN - NP

## 2022-02-21 ENCOUNTER — Ambulatory Visit: Payer: BLUE CROSS/BLUE SHIELD | Attending: Psychiatric/Mental Health | Primary: Internal Medicine

## 2022-03-08 ENCOUNTER — Encounter

## 2022-03-08 MED ORDER — AMPHETAMINE-DEXTROAMPHETAMINE 10 MG PO TABS
10 MG | ORAL_TABLET | Freq: Two times a day (BID) | ORAL | 0 refills | Status: AC
Start: 2022-03-08 — End: 2022-04-07

## 2022-04-10 ENCOUNTER — Encounter

## 2022-04-10 MED ORDER — AMPHETAMINE-DEXTROAMPHETAMINE 10 MG PO TABS
10 MG | ORAL_TABLET | Freq: Two times a day (BID) | ORAL | 0 refills | Status: DC
Start: 2022-04-10 — End: 2022-05-08

## 2022-04-14 NOTE — Telephone Encounter (Signed)
closed

## 2022-04-17 NOTE — Telephone Encounter (Signed)
closed

## 2022-04-24 ENCOUNTER — Encounter

## 2022-04-25 MED ORDER — ESCITALOPRAM OXALATE 20 MG PO TABS
20 MG | ORAL_TABLET | Freq: Every day | ORAL | 0 refills | Status: AC
Start: 2022-04-25 — End: 2022-05-08

## 2022-04-29 ENCOUNTER — Inpatient Hospital Stay
Admit: 2022-04-29 | Discharge: 2022-04-29 | Disposition: A | Discharge status: Home / Self Care | Payer: BLUE CROSS/BLUE SHIELD | Attending: Emergency Medicine

## 2022-04-29 ENCOUNTER — Emergency Department: Payer: BLUE CROSS/BLUE SHIELD | Primary: Internal Medicine

## 2022-04-29 DIAGNOSIS — N12 Tubulo-interstitial nephritis, not specified as acute or chronic: Secondary | ICD-10-CM

## 2022-04-29 LAB — CBC WITH AUTO DIFFERENTIAL
Basophils %: 0 % (ref 0–2)
Basophils Absolute: 0 10*3/uL (ref 0.0–0.1)
Eosinophils %: 0 % (ref 0–5)
Eosinophils Absolute: 0 10*3/uL (ref 0.0–0.4)
Hematocrit: 42.4 % (ref 35.0–45.0)
Hemoglobin: 14.6 g/dL (ref 12.0–16.0)
Immature Granulocytes %: 1 % — ABNORMAL HIGH (ref 0.0–0.5)
Immature Granulocytes Absolute: 0 10*3/uL (ref 0.00–0.04)
Lymphocytes %: 22 % (ref 21–52)
Lymphocytes Absolute: 1.1 10*3/uL (ref 0.9–3.6)
MCH: 29.5 PG (ref 24.0–34.0)
MCHC: 34.4 g/dL (ref 31.0–37.0)
MCV: 85.7 FL (ref 78.0–100.0)
MPV: 8.3 FL — ABNORMAL LOW (ref 9.2–11.8)
Monocytes %: 5 % (ref 3–10)
Monocytes Absolute: 0.2 10*3/uL (ref 0.05–1.2)
Neutrophils %: 73 % (ref 40–73)
Neutrophils Absolute: 3.5 10*3/uL (ref 1.8–8.0)
Nucleated RBCs: 0 PER 100 WBC
Platelets: 323 10*3/uL (ref 135–420)
RBC: 4.95 M/uL (ref 4.20–5.30)
RDW: 12.1 % (ref 11.6–14.5)
WBC: 4.9 10*3/uL (ref 4.6–13.2)
nRBC: 0 10*3/uL (ref 0.00–0.01)

## 2022-04-29 LAB — URINALYSIS
Bilirubin Urine: NEGATIVE
Blood, Urine: NEGATIVE
Glucose, UA: NEGATIVE mg/dL
Nitrite, Urine: NEGATIVE
Specific Gravity, UA: 1.03 (ref 1.005–1.030)
Urobilinogen, Urine: 1 EU/dL (ref 0.2–1.0)
pH, Urine: 5 (ref 5.0–8.0)

## 2022-04-29 LAB — URINALYSIS, MICRO: WBC, UA: 4 /hpf (ref 0–4)

## 2022-04-29 LAB — COMPREHENSIVE METABOLIC PANEL
ALT: 18 U/L (ref 13–56)
AST: 10 U/L (ref 10–38)
Albumin/Globulin Ratio: 0.9 (ref 0.8–1.7)
Albumin: 3.4 g/dL (ref 3.4–5.0)
Alk Phosphatase: 59 U/L (ref 45–117)
Anion Gap: 6 mmol/L (ref 3.0–18)
BUN/Creatinine Ratio: 13 (ref 12–20)
BUN: 11 MG/DL (ref 7.0–18)
CO2: 23 mmol/L (ref 21–32)
Calcium: 8.5 MG/DL (ref 8.5–10.1)
Chloride: 110 mmol/L (ref 100–111)
Creatinine: 0.83 MG/DL (ref 0.6–1.3)
Est, Glom Filt Rate: >90 mL/min/{1.73_m2} (ref >60)
Globulin: 3.7 g/dL (ref 2.0–4.0)
Glucose: 88 mg/dL (ref 74–99)
Potassium: 3.7 mmol/L (ref 3.5–5.5)
Sodium: 139 mmol/L (ref 136–145)
Total Bilirubin: 0.4 MG/DL (ref 0.2–1.0)
Total Protein: 7.1 g/dL (ref 6.4–8.2)

## 2022-04-29 LAB — LIPASE: Lipase: 31 U/L (ref 13–75)

## 2022-04-29 LAB — PREGNANCY, URINE: Pregnancy, Urine: NEGATIVE

## 2022-04-29 LAB — POCT GLUCOSE: POC Glucose: 83 mg/dL (ref 70–110)

## 2022-04-29 LAB — TROPONIN: Troponin, High Sensitivity: 3 ng/L (ref 0–54)

## 2022-04-29 MED ORDER — CEFDINIR 300 MG PO CAPS
300 | ORAL_CAPSULE | Freq: Two times a day (BID) | ORAL | 0 refills | Status: AC
Start: 2022-04-29 — End: 2022-05-06

## 2022-04-29 MED ORDER — METHOCARBAMOL 750 MG PO TABS
750 | ORAL_TABLET | Freq: Three times a day (TID) | ORAL | 0 refills | Status: AC | PRN
Start: 2022-04-29 — End: 2022-05-04

## 2022-04-29 MED ORDER — LACTATED RINGERS IV BOLUS
Freq: Once | INTRAVENOUS | Status: AC
Start: 2022-04-29 — End: 2022-04-29
  Administered 2022-04-29: 16:00:00 1000 mL via INTRAVENOUS

## 2022-04-29 MED ORDER — LACTATED RINGERS IV SOLN
INTRAVENOUS | Status: DC
Start: 2022-04-29 — End: 2022-04-29
  Administered 2022-04-29: 17:00:00 via INTRAVENOUS

## 2022-04-29 MED ORDER — KETOROLAC TROMETHAMINE 15 MG/ML IJ SOLN
15 | INTRAMUSCULAR | Status: AC
Start: 2022-04-29 — End: 2022-04-29
  Administered 2022-04-29: 16:00:00 15 mg via INTRAVENOUS

## 2022-04-29 MED ORDER — IBUPROFEN 600 MG PO TABS
600 | ORAL_TABLET | Freq: Four times a day (QID) | ORAL | 0 refills | Status: AC | PRN
Start: 2022-04-29 — End: ?

## 2022-04-29 MED ORDER — FLUCONAZOLE 150 MG PO TABS
150 | ORAL_TABLET | Freq: Once | ORAL | 0 refills | Status: AC
Start: 2022-04-29 — End: 2022-04-29

## 2022-04-29 MED ORDER — CEFTRIAXONE SODIUM 1 G IJ SOLR
1 g | Freq: Once | INTRAMUSCULAR | Status: AC
Start: 2022-04-29 — End: 2022-04-29
  Administered 2022-04-29: 17:00:00 1000 mg via INTRAVENOUS

## 2022-04-29 MED ORDER — ONDANSETRON HCL 4 MG/2ML IJ SOLN
4 MG/2ML | INTRAMUSCULAR | Status: AC
Start: 2022-04-29 — End: 2022-04-29
  Administered 2022-04-29: 16:00:00 4 mg via INTRAVENOUS

## 2022-04-29 MED ORDER — FAMOTIDINE (PF) 20 MG/2ML IV SOLN
20 MG/2ML | INTRAVENOUS | Status: AC
Start: 2022-04-29 — End: 2022-04-29
  Administered 2022-04-29: 16:00:00 20 mg via INTRAVENOUS

## 2022-04-29 MED ORDER — ONDANSETRON 4 MG PO TBDP
4 | ORAL_TABLET | Freq: Three times a day (TID) | ORAL | 0 refills | Status: AC | PRN
Start: 2022-04-29 — End: ?

## 2022-04-29 MED FILL — ONDANSETRON HCL 4 MG/2ML IJ SOLN: 4 MG/2ML | INTRAMUSCULAR | Qty: 2

## 2022-04-29 MED FILL — CEFTRIAXONE SODIUM 1 G IJ SOLR: 1 g | INTRAMUSCULAR | Qty: 1000

## 2022-04-29 MED FILL — KETOROLAC TROMETHAMINE 15 MG/ML IJ SOLN: 15 MG/ML | INTRAMUSCULAR | Qty: 1

## 2022-04-29 MED FILL — FAMOTIDINE (PF) 20 MG/2ML IV SOLN: 20 MG/2ML | INTRAVENOUS | Qty: 2

## 2022-04-29 MED FILL — LACTATED RINGERS IV SOLN: INTRAVENOUS | Qty: 1000

## 2022-04-29 NOTE — ED Triage Notes (Signed)
Ambulatory to 2 with c/o severe sharp pain in lower back radiating down right leg with numbness. Befast neg. States hx of sciatica but much worse past 2 days. States N/V since yesterday.

## 2022-04-29 NOTE — ED Provider Notes (Signed)
EMERGENCY DEPARTMENT HISTORY AND PHYSICAL EXAM    11:25 AM      Date: 04/29/2022  Patient Name: Lauren Rocha    History of Presenting Illness     Chief Complaint   Patient presents with    Back Pain    Leg Problem    Emesis    Nausea       History From: Patient    Lauren Rocha is a 42 y.o. female   Patient is a 42 year old female with a history of PCOS, anxiety, sciatica, Achilles surgery, that presents emergency department with complaint of nausea, vomiting, and diarrhea that began overnight last night.  Patient said that she started having some vomiting and diarrhea overnight and this morning got up try to eat something and then started having the same thing over again.  During this episode she started having some pain to her low back with radiating down the right thigh occasionally get some numbness into her right foot.  Patient says she has a history of sciatica this been several years since she has had an episode like this.  The patient denies any other aggravating or alleviating factors.  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  The patient is not a smoker, occasionally drinks alcohol, denies any drug use.  Patient is also having some pain that radiates to her flank.           Nursing Notes were all reviewed and agreed with or any disagreements were addressed in the HPI.    PCP: Hilbert Odor, MD    Current Facility-Administered Medications   Medication Dose Route Frequency Provider Last Rate Last Admin    lactated ringers bolus 1,000 mL  1,000 mL IntraVENous Once Everlena Cooper, MD        lactated ringers IV soln infusion   IntraVENous Continuous Everlena Cooper, MD        ondansetron [facility]) injection 4 mg  4 mg IntraVENous NOW Everlena Cooper, MD        famotidine (PEPCID) 20 mg in sodium chloride (PF) 0.9 % 10 mL injection  20 mg IntraVENous NOW Everlena Cooper, MD        ketorolac (TORADOL) injection  15 mg  15 mg IntraVENous NOW Everlena Cooper, MD         Current Outpatient Medications   Medication Sig Dispense Refill    escitalopram (LEXAPRO) 20 MG tablet Take 1 tablet by mouth daily 30 tablet 0    amphetamine-dextroamphetamine (ADDERALL) 10 MG tablet Take 1 tablet by mouth 2 times daily for 30 days. Max Daily Amount: 20 mg 60 tablet 0    albuterol (PROVENTIL) (2.5 MG/3ML) 0.083% nebulizer solution Inhale 3 mLs into the lungs every 4 hours as needed      cetirizine (ZYRTEC) 10 MG tablet Take 1 tablet by mouth daily      vitamin D (CHOLECALCIFEROL) 50000 UNIT CAPS Take 1 capsule by mouth every 7 days      ibuprofen (ADVIL;MOTRIN) 800 MG tablet Take 1 tablet by mouth every 8 hours as needed      levonorgestrel-ethinyl estradiol (ALTAVERA) 0.15-30 MG-MCG per tablet Take 1 tablet by mouth daily      LORazepam (ATIVAN) 0.5 MG tablet Take 1 tablet by mouth 2 times daily as needed.      metFORMIN (GLUCOPHAGE-XR) 500 MG extended release tablet Take 2 tablets by mouth daily  Past History     Past Medical History:  Past Medical History:   Diagnosis Date    Anxiety     PCOS (polycystic ovarian syndrome)     Sciatica        Past Surgical History:  Past Surgical History:   Procedure Laterality Date    BREAST SURGERY      r breast cyst removal    ORTHOPEDIC SURGERY         Family History:  Family History   Problem Relation Age of Onset    Cancer Maternal Grandmother         breast    Diabetes Maternal Grandmother     Breast Cancer Maternal Grandmother     Cancer Maternal Aunt         breast       Social History:  Social History     Tobacco Use    Smoking status: Never    Smokeless tobacco: Never   Substance Use Topics    Alcohol use: Yes       Allergies:  Allergies   Allergen Reactions    Strawberry Anaphylaxis    Amoxicillin-Pot Clavulanate Other (See Comments)     Rectal bleeding         Review of Systems       Review of Systems   Constitutional:  Negative for activity change and fatigue.   HENT:  Negative for  congestion.    Eyes: Negative.    Respiratory:  Negative for chest tightness.    Cardiovascular:  Negative for chest pain.   Gastrointestinal:  Positive for abdominal pain, diarrhea, nausea and vomiting.   Genitourinary:  Negative for dysuria.   Musculoskeletal:  Negative for arthralgias.   Skin: Negative.    Neurological:  Negative for weakness.   Hematological: Negative.          Physical Exam   BP 132/75   Pulse 92   Temp 98.6 F (37 C) (Oral)   Resp 16   Ht 1.549 m (5\' 1" )   Wt 97.1 kg (214 lb)   SpO2 99%   BMI 40.43 kg/m       Physical Exam  Vitals and nursing note reviewed.   Constitutional:       General: She is not in acute distress.     Appearance: Normal appearance.   HENT:      Head: Normocephalic and atraumatic.      Right Ear: External ear normal.      Left Ear: External ear normal.      Nose: Nose normal.      Mouth/Throat:      Mouth: Mucous membranes are moist.   Eyes:      Extraocular Movements: Extraocular movements intact.      Pupils: Pupils are equal, round, and reactive to light.   Cardiovascular:      Rate and Rhythm: Normal rate.   Pulmonary:      Effort: Pulmonary effort is normal.   Abdominal:      General: There is no distension.      Palpations: Abdomen is soft.   Musculoskeletal:         General: Normal range of motion.      Comments: Right-sided paraspinal lumbar tenderness, no step-off  Straight leg raising reproduces pain down the leg     Skin:     General: Skin is warm and dry.      Capillary Refill: Capillary refill takes less than 2 seconds.  Neurological:      General: No focal deficit present.      Mental Status: She is alert and oriented to person, place, and time.      Comments: Symmetric upper body strength, symmetric lower body strength with pain at the right hip with movement, full range of motion of the bilateral lower extremities, distal pulses are equal   Psychiatric:         Mood and Affect: Mood normal.         Behavior: Behavior normal.      Comments: Good  insight to her care, supportive friend at the bedside           Diagnostic Study Results     Labs -  Recent Results (from the past 12 hour(s))   POCT Glucose    Collection Time: 04/29/22 10:34 AM   Result Value Ref Range    POC Glucose 83 70 - 110 mg/dL   CBC with Auto Differential    Collection Time: 04/29/22 10:38 AM   Result Value Ref Range    WBC 4.9 4.6 - 13.2 K/uL    RBC 4.95 4.20 - 5.30 M/uL    Hemoglobin 14.6 12.0 - 16.0 g/dL    Hematocrit 63.8 45.3 - 45.0 %    MCV 85.7 78.0 - 100.0 FL    MCH 29.5 24.0 - 34.0 PG    MCHC 34.4 31.0 - 37.0 g/dL    RDW 64.6 80.3 - 21.2 %    Platelets 323 135 - 420 K/uL    MPV 8.3 (L) 9.2 - 11.8 FL    Nucleated RBCs 0.0 0 PER 100 WBC    nRBC 0.00 0.00 - 0.01 K/uL    Neutrophils % 73 40 - 73 %    Lymphocytes % 22 21 - 52 %    Monocytes % 5 3 - 10 %    Eosinophils % 0 0 - 5 %    Basophils % 0 0 - 2 %    Immature Granulocytes 1 (H) 0.0 - 0.5 %    Neutrophils Absolute 3.5 1.8 - 8.0 K/UL    Lymphocytes Absolute 1.1 0.9 - 3.6 K/UL    Monocytes Absolute 0.2 0.05 - 1.2 K/UL    Eosinophils Absolute 0.0 0.0 - 0.4 K/UL    Basophils Absolute 0.0 0.0 - 0.1 K/UL    Absolute Immature Granulocyte 0.0 0.00 - 0.04 K/UL    Differential Type AUTOMATED     Comprehensive Metabolic Panel    Collection Time: 04/29/22 10:38 AM   Result Value Ref Range    Sodium 139 136 - 145 mmol/L    Potassium 3.7 3.5 - 5.5 mmol/L    Chloride 110 100 - 111 mmol/L    CO2 23 21 - 32 mmol/L    Anion Gap 6 3.0 - 18 mmol/L    Glucose 88 74 - 99 mg/dL    BUN 11 7.0 - 18 MG/DL    Creatinine 2.48 0.6 - 1.3 MG/DL    Bun/Cre Ratio 13 12 - 20      Est, Glom Filt Rate >90 >60 ml/min/1.83m2    Calcium 8.5 8.5 - 10.1 MG/DL    Total Bilirubin 0.4 0.2 - 1.0 MG/DL    ALT 18 13 - 56 U/L    AST 10 10 - 38 U/L    Alk Phosphatase 59 45 - 117 U/L    Total Protein 7.1 6.4 - 8.2 g/dL    Albumin 3.4 3.4 - 5.0  g/dL    Globulin 3.7 2.0 - 4.0 g/dL    Albumin/Globulin Ratio 0.9 0.8 - 1.7     Lipase    Collection Time: 04/29/22 10:38 AM   Result  Value Ref Range    Lipase 31 13 - 75 U/L   Troponin    Collection Time: 04/29/22 10:38 AM   Result Value Ref Range    Troponin, High Sensitivity 3 0 - 54 ng/L   EKG 12 Lead    Collection Time: 04/29/22 10:46 AM   Result Value Ref Range    Ventricular Rate 90 BPM    Atrial Rate 90 BPM    P-R Interval 146 ms    QRS Duration 66 ms    Q-T Interval 350 ms    QTc Calculation (Bazett) 428 ms    P Axis 51 degrees    R Axis 13 degrees    T Axis 45 degrees    Diagnosis       Normal sinus rhythm with sinus arrhythmia  Low voltage QRS  Nonspecific T wave abnormality  Abnormal ECG  No previous ECGs available     Urinalysis    Collection Time: 04/29/22 10:50 AM   Result Value Ref Range    Color, UA YELLOW      Appearance CLOUDY      Specific Gravity, UA 1.030 1.005 - 1.030      pH, Urine 5.0 5.0 - 8.0      Protein, UA TRACE (A) NEG mg/dL    Glucose, UA Negative NEG mg/dL    Ketones, Urine TRACE (A) NEG mg/dL    Bilirubin Urine Negative NEG      Blood, Urine Negative NEG      Urobilinogen, Urine 1.0 0.2 - 1.0 EU/dL    Nitrite, Urine Negative NEG      Leukocyte Esterase, Urine MODERATE (A) NEG     Pregnancy, Urine    Collection Time: 04/29/22 10:50 AM   Result Value Ref Range    HCG(Urine) Pregnancy Test Negative NEG     Urinalysis, Micro    Collection Time: 04/29/22 10:50 AM   Result Value Ref Range    WBC, UA 4 to 10 0 - 4 /hpf    RBC, UA NONE 0 - 5 /hpf    Epithelial Cells UA 3+ 0 - 5 /lpf    BACTERIA, URINE FEW (A) NEG /hpf    Mucus, UA 4+ (A) NEG /lpf       Radiologic Studies -   No orders to display         Medical Decision Making   I am the first provider for this patient.    I reviewed the vital signs, available nursing notes, past medical history, past surgical history, family history and social history.    Vital Signs-Reviewed the patient's vital signs.    EKG:, Normal sinus rhythm at 90, interpreted by me      ED Course: Progress Notes, Reevaluation, and Consults:    Provider Notes (Medical Decision Making):      MDM  Number of Diagnoses or Management Options  Nausea and vomiting, unspecified vomiting type  Pain in joint involving right pelvic region and thigh  Pyelonephritis  Diagnosis management comments: Patient is a 42 year old female with a history of PCOS, anxiety, ADHD, presents emergency department with complaint of nausea, vomiting, diarrhea, with some right-sided back pain radiating down to her hip.  Patient has a history of sciatica and had some radicular symptoms down the  leg without any motor loss on my exam.  Patient's workup shows reassuring labs however there is some evidence of pyuria and bacteria in her urine without any blood suggesting that she has a urinary tract infection.  Suspect the patient has pyelonephritis causing some of her more systemic symptoms and after supportive care is feeling much better and is given a dose of Rocephin.  The patient has no hematuria and presentation is not suggestive of stone disease and there is no motor symptoms suggest the patient has a spinal infection.  Will proceed with close outpatient care, and the patient has great insight to her care and will follow closely as an outpatient and return if she is at all worsened or concerned.         Patient is feeling much better, said pain is almost completely resolved after Toradol, will proceed with close outpatient care.    Workup and recommendations were reviewed with the patient and all questions were answered.  The patient understands the plan and will proceed with close outpatient care.  I have encouraged the patient to return if at all worsened or concerned.   Myriam Jacobson, DO 12:48 AM      Patient was given the following medications:  Medications   lactated ringers bolus 1,000 mL (has no administration in time range)   lactated ringers IV soln infusion (has no administration in time range)   ondansetron (ZOFRAN) injection 4 mg (has no administration in time range)   famotidine (PEPCID) 20 mg in sodium chloride  (PF) 0.9 % 10 mL injection (has no administration in time range)   ketorolac (TORADOL) injection 15 mg (has no administration in time range)       CONSULTS: (Who and What was discussed)  None    Chronic Conditions: PCOS, anxiety, ADHD    Social Determinants affecting Dx or Tx: None    Records Reviewed (source and summary of external notes): Nursing Notes, Old Medical Records, Previous Radiology Studies, and Previous Laboratory Studies    Procedures      CLINICAL MANAGEMENT TOOLS:  Not Applicable, Back Pain: MEDICAL DECISION MAKING:   I considered, but did not perform, additional testing such as MRI Spine or Brain, as well as admission or transfer to a higher level of care.     I utilized an evidence-based risk rating tool (CMT) along with my training and experience to weigh the risk of discharge against the risks of further testing, imaging, or hospitalization. At this time, I estimate the risks of additional testing, imaging, or hospitalization (in the case of discharge) to be equal to or greater than the risk of discharge. Given the symptoms and findings present at this time, the chance of SEA or SCC is so remote that additional testing or imaging is more likely to harm the patient than diagnose SEA. [id number]    SHARED DECISION MAKING:   I discussed my risk assessment with the patient. The patient understands and consents to the risk of disposition/plan, as well as the risk of uncertainty in estimating outcomes. [id number]            Diagnosis     Clinical Impression:   1. Pyelonephritis    2. Nausea and vomiting, unspecified vomiting type    3. Pain in joint involving right pelvic region and thigh        Disposition: DC    Hanley Ben, MD  [address] [place]  [address]  [place] [address]  [phone]  In 2 days      [facility]  [address] [place] [address]  [phone]    As needed, If symptoms worsen       Disclaimer: Sections of this note  are dictated using utilizing voice recognition software.  Minor typographical errors may be present. If questions arise, please do not hesitate to contact me or call our department.       Alver Sorrow, MD  05/03/22 [phone]

## 2022-04-29 NOTE — Discharge Instructions (Signed)
Start taking your antibiotics tomorrow as we gave your first dose in the emergency department, take ibuprofen with your muscle relaxant for pain, Zofran as needed for vomiting, make sure to follow closely with your primary provider.  If you have any fevers, worsening pain, or you are at all worsened or concerned, please return immediately for reevaluation.

## 2022-04-30 LAB — EKG 12-LEAD
Atrial Rate: 90 {beats}/min
Diagnosis: NORMAL
P Axis: 51 degrees
P-R Interval: 146 ms
Q-T Interval: 350 ms
QRS Duration: 66 ms
QTc Calculation (Bazett): 428 ms
R Axis: 13 degrees
T Axis: 45 degrees
Ventricular Rate: 90 {beats}/min

## 2022-04-30 LAB — CULTURE, URINE: Colony count: 15000

## 2022-05-08 ENCOUNTER — Ambulatory Visit
Admit: 2022-05-08 | Discharge: 2022-05-08 | Payer: BLUE CROSS/BLUE SHIELD | Attending: Psychiatric/Mental Health | Primary: Internal Medicine

## 2022-05-08 DIAGNOSIS — F902 Attention-deficit hyperactivity disorder, combined type: Secondary | ICD-10-CM

## 2022-05-08 MED ORDER — ESCITALOPRAM OXALATE 20 MG PO TABS
20 MG | ORAL_TABLET | Freq: Every day | ORAL | 0 refills | Status: AC
Start: 2022-05-08 — End: 2022-08-02

## 2022-05-08 MED ORDER — AMPHETAMINE-DEXTROAMPHETAMINE 10 MG PO TABS
10 MG | ORAL_TABLET | Freq: Two times a day (BID) | ORAL | 0 refills | Status: AC
Start: 2022-05-08 — End: 2022-06-07

## 2022-05-08 NOTE — Progress Notes (Signed)
Lauren Rocha, was evaluated through a synchronous (real-time) audio-video encounter. The patient (or guardian if applicable) is aware that this is a billable service, which includes applicable co-pays. This Virtual Visit was conducted with patient's (and/or legal guardian's) consent. Patient identification was verified, and a caregiver was present when appropriate.     The patient was located at Home: [id number]  [place] [id number]  [facility] [address]  [address]  [place],  [place] [id number]  Confirm you are appropriately licensed, registered, or certified to deliver care in the state where the patient is located as indicated above. If you are not or unsure, please re-schedule the visit: Yes, I confirm.       CHIEF COMPLAINT:  Lauren Rocha is a 42 y.o. female and was seen today for follow-up of psychiatric condition and psychotropic medication management.     HPI:    Lauren Rocha reports the following psychiatric symptoms by hx:  depression, anxiety, inattentiveness.  Overall symptoms have been present for years. Currently symptoms are of low to moderate severity. The symptoms occur less frequently and are less severe. Patient reports work stress and having some resurfacing of past issues. Depression has been mostly manageable. She rates her depression at 2/10 with 10 being the worst. Her anxiety is somewhat increased with current work stress, Anxiety is rated at 8/10 with 10 being the worst. Energy level is good. Concentrate and ability to stay on task is good with medications. She reports  better compliance with medications. She has a pill box and sets alarms. Denies side effects. No complaint of chest pain, dizziness, palpitations, headaches. Patient Appetite:no change from normal. Sleep: fair, averaging about 5 hours. 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 She has started to participate in weekly therapy. Patient denies symptoms of psychosis or mania. Denies any current thoughts of suicide or homicide.    Current Outpatient Medications on File Prior to Visit   Medication Sig Dispense Refill    ibuprofen (IBU) 600 MG tablet Take 1 tablet by mouth every 6 hours as needed for Pain 20 tablet 0    ondansetron (ZOFRAN-ODT) 4 MG disintegrating tablet Take 1 tablet by mouth 3 times daily as needed for Nausea or Vomiting 21 tablet 0    albuterol (PROVENTIL) (2.5 MG/3ML) 0.083% nebulizer solution Inhale 3 mLs into the lungs every 4 hours as needed      cetirizine (ZYRTEC) 10 MG tablet Take 1 tablet by mouth daily      vitamin D (CHOLECALCIFEROL) 50000 UNIT CAPS Take 1 capsule by mouth every 7 days      levonorgestrel-ethinyl estradiol (ALTAVERA) 0.15-30 MG-MCG per tablet Take 1 tablet by mouth daily      LORazepam (ATIVAN) 0.5 MG tablet Take 1 tablet by mouth 2 times daily as needed.      metFORMIN (GLUCOPHAGE-XR) 500 MG extended release tablet Take 2 tablets by mouth daily       No current facility-administered medications on file prior to visit.        Past Medical History:   Diagnosis Date    Anxiety     PCOS (polycystic ovarian syndrome)     Sciatica  Family History   Problem Relation Age of Onset    Cancer Maternal Grandmother         breast    Diabetes Maternal Grandmother     Breast Cancer Maternal Grandmother     Cancer Maternal Aunt         breast          Social History     Socioeconomic History    Marital status: Divorced     Spouse name: Not on file    Number of children: Not on file    Years of education: Not on file    Highest education level: Not on file   Occupational History    Not on file   Tobacco Use    Smoking status: Never    Smokeless tobacco: Never   Substance and Sexual Activity    Alcohol use: Yes     Drug use: Not on file    Sexual activity: Not on file   Other Topics Concern    Not on file   Social History Narrative    Not on file     Social Determinants of Health     Financial Resource Strain: Not on file   Food Insecurity: Not on file   Transportation Needs: Not on file   Physical Activity: Not on file   Stress: Not on file   Social Connections: Not on file   Intimate Partner Violence: Not on file   Housing Stability: Not on file     REVIEW OF SYSTEMS:  Psychiatric symptoms being monitored for:  depression, anxiety, inattentiveness  Constitutional: No report of fever, or weight gain.  Eyes: No report of acute vision changes.   ENT: No report of hearing changes or difficulty swallowing.   Cardiac: No report of chest pain, edema or orthopnea.   Respiratory: Denies dyspnea, cough or wheeze.   GI: No report of abdominal pain.   GU: No report of dysuria or hematuria.   Musculoskeletal: No report of joint pain or swelling.   Skin: No report of rash, lesion, or abrasions.   Neurologic: No report of seizures, blackout, numbness.   Endocrine: No report of polyuria or polydipsia.   Hematologic: No report of blood clots or easy bleeding.   Allergy: No report of hives or allergic reaction.   Reproductive: No report of significant issues    There were no vitals taken for this visit.    MENTAL STATUS EXAM:   Appearance: Appears stated age. Good grooming.  Involuntary Movements: None  Attitude: Cooperative  Speech: normal  Language: patient able to repeat the sentence "no ifs ands or buts."  Mood:  "pretty good"  Affect: congruent with mood  Thought Process: Logical   Thought Content: no delusion or paranoid thoughts.  Suicidal/ Homicidal Ideations: denied  Thought Perception: no auditory or visual hallucinations  Orientation: alert, oriented to person, place and time.  Recent Memory: Adequate  Remote Memory: Adequate  Attention/Concentration: able to spell word "WORLD" backwards.  Insight/ Judgment: Good    [place]:       11/21/2021     1:16 PM   PHQ-9    Little interest or pleasure in doing things 0   Feeling down, depressed, or hopeless 0   Trouble falling or staying asleep, or sleeping too much 3   Feeling tired or having little energy 1   Poor appetite or overeating 3   Feeling bad about yourself - or that you are a failure or have  let yourself or your family down 1   Trouble concentrating on things, such as reading the newspaper or watching television 1   Moving or speaking so slowly that other people could have noticed. Or the opposite - being so fidgety or restless that you have been moving around a lot more than usual 0   Thoughts that you would be better off dead, or of hurting yourself in some way 0   PHQ-2 Score 0   PHQ-9 Total Score 9   If you checked off any problems, how difficult have these problems made it for you to do your work, take care of things at home, or get along with other people? 1              11/21/2021     1:16 PM   GAD-7 SCREENING   Feeling nervous, anxious, or on edge More than half the days   Not being able to stop or control worrying More than half the days   Worrying too much about different things More than half the days   Trouble relaxing Several days   Being so restless that it is hard to sit still Several days   Becoming easily annoyed or irritable Several days   Feeling afraid as if something awful might happen Not at all   GAD-7 Total Score 9   How difficult have these problems made it for you to do your work, take care of things at home, or get along with other people? Somewhat difficult        MEDICAL DECISION MAKING:  Problems addressed today:      ICD-10-CM    1. Attention-deficit hyperactivity disorder, combined type  F90.2 amphetamine-dextroamphetamine (ADDERALL) 10 MG tablet      2. Major depressive disorder, recurrent, in partial remission (HCC)  F33.41 escitalopram (LEXAPRO) 20 MG tablet      3. Generalized anxiety disorder  F41.1 escitalopram (LEXAPRO) 20 MG tablet            Assessment:   Cherith  is responding to treatment. Anxiety is somewhat increased with stress. Discussed current medications and dosages.Mutually agreed to continue with current regimen. Patient to communicate if symptoms worsen or fail to improve before their next scheduled visit. Risks, benefits, and side effects of medications to include drug to drug interactions were reviewed and discussed in detail. Patient agreed that the potential benefit from a medication trial outweighs the acknowledged risks. Reviewed treatment goals and target symptoms to monitor for.     Plan:  1. MEDICATION:        medication changes made today:     Current Outpatient Medications:     amphetamine-dextroamphetamine (ADDERALL) 10 MG tablet, Take 1 tablet by mouth 2 times daily for 30 days. Max Daily Amount: 20 mg, Disp: 60 tablet, Rfl: 0    escitalopram (LEXAPRO) 20 MG tablet, Take 1 tablet by mouth daily, Disp: 90 tablet, Rfl: 0    ibuprofen (IBU) 600 MG tablet, Take 1 tablet by mouth every 6 hours as needed for Pain, Disp: 20 tablet, Rfl: 0    ondansetron (ZOFRAN-ODT) 4 MG disintegrating tablet, Take 1 tablet by mouth 3 times daily as needed for Nausea or Vomiting, Disp: 21 tablet, Rfl: 0    albuterol (PROVENTIL) (2.5 MG/3ML) 0.083% nebulizer solution, Inhale 3 mLs into the lungs every 4 hours as needed, Disp: , Rfl:     cetirizine (ZYRTEC) 10 MG tablet, Take 1 tablet by mouth daily, Disp: , Rfl:  vitamin D (CHOLECALCIFEROL) 50000 UNIT CAPS, Take 1 capsule by mouth every 7 days, Disp: , Rfl:     levonorgestrel-ethinyl estradiol (ALTAVERA) 0.15-30 MG-MCG per tablet, Take 1 tablet by mouth daily, Disp: , Rfl:     LORazepam (ATIVAN) 0.5 MG tablet, Take 1 tablet by mouth 2 times daily as needed., Disp: , Rfl:     metFORMIN (GLUCOPHAGE-XR) 500 MG extended release tablet, Take 2 tablets by mouth daily, Disp: , Rfl:     2.  Counseling and coordination of care including instructions for treatment, risks/benefits, risk factor reduction and  patient/family education. She agrees with the plan. Patient instructed to call with any side effects, questions or issues.   3.PSYCHOTHERAPY: Patient was provided with supportive therapy, strongly encourage to seek psychotherapy.  Patient refused psychotherapy at this time.  4. MEDICAL CARE: Patient was strongly encourage to take their medical medications and follow up with their PCP on regular basis.    5. SUBSTANCE ABUSE CARE: Patient denies a smoking, drinking, abusing any illicit drugs.  6. Patient was informed that in case of emergency to go to the nearest ER or call 911.     Patient was given time to ask questions and voice concerns. I believe all questions concerns were adequately addressed at this office visit. Patient verbalized agreement and understanding of the above-stated plan.    Follow-up and Dispositions    Return in about 3 months (around 08/07/2022).       05/08/2022  Durenda Hurt, APRN - NP

## 2022-06-26 ENCOUNTER — Encounter

## 2022-06-27 MED ORDER — AMPHETAMINE-DEXTROAMPHETAMINE 10 MG PO TABS
10 MG | ORAL_TABLET | Freq: Two times a day (BID) | ORAL | 0 refills | Status: DC
Start: 2022-06-27 — End: 2022-08-02

## 2022-06-27 NOTE — Telephone Encounter (Signed)
Called and left a detailed voice message for callback to schedule IN OFFICE follow up visit. Provider requesting patient to return in about 3 months (around 08/07/2022).

## 2022-08-02 ENCOUNTER — Encounter

## 2022-08-02 MED ORDER — AMPHETAMINE-DEXTROAMPHETAMINE 10 MG PO TABS
10 MG | ORAL_TABLET | Freq: Two times a day (BID) | ORAL | 0 refills | Status: AC
Start: 2022-08-02 — End: 2022-09-01

## 2022-08-02 MED ORDER — ESCITALOPRAM OXALATE 20 MG PO TABS
20 MG | ORAL_TABLET | Freq: Every day | ORAL | 0 refills | Status: AC
Start: 2022-08-02 — End: 2022-09-18

## 2022-09-18 ENCOUNTER — Encounter

## 2022-09-18 MED ORDER — AMPHETAMINE-DEXTROAMPHETAMINE 10 MG PO TABS
10 MG | ORAL_TABLET | Freq: Two times a day (BID) | ORAL | 0 refills | Status: AC
Start: 2022-09-18 — End: 2022-10-18

## 2022-09-18 MED ORDER — ESCITALOPRAM OXALATE 20 MG PO TABS
20 MG | ORAL_TABLET | Freq: Every day | ORAL | 0 refills | Status: DC
Start: 2022-09-18 — End: 2022-12-13

## 2022-09-19 ENCOUNTER — Ambulatory Visit
Admit: 2022-09-19 | Discharge: 2022-09-19 | Payer: BLUE CROSS/BLUE SHIELD | Attending: Psychiatric/Mental Health | Primary: Internal Medicine

## 2022-09-19 DIAGNOSIS — F3341 Major depressive disorder, recurrent, in partial remission: Secondary | ICD-10-CM

## 2022-09-19 MED ORDER — TOPIRAMATE 50 MG PO TABS
50 | ORAL_TABLET | Freq: Every evening | ORAL | 0 refills | Status: AC
Start: 2022-09-19 — End: ?

## 2022-09-19 NOTE — Progress Notes (Unsigned)
Chief Complaint   Patient presents with    Medication Check      Vitals:    09/19/22 1504   BP: 123/82   Pulse: 65   Resp: 16   Temp: 97.1 F (36.2 C)   SpO2: 97%      Prior to Admission medications    Medication Sig Start Date End Date Taking? Authorizing Provider   hydrOXYzine HCl (ATARAX) 25 MG tablet Take 1 tablet by mouth at bedtime   Yes [provider]   amLODIPine (NORVASC) 2.5 MG tablet Take 2 tablets by mouth daily   Yes [provider]   amphetamine-dextroamphetamine (ADDERALL) 10 MG tablet Take 1 tablet by mouth 2 times daily for 30 days. Max Daily Amount: 20 mg 09/18/22 10/18/22 Yes Durenda Hurt, APRN - NP   escitalopram (LEXAPRO) 20 MG tablet Take 1 tablet by mouth daily 09/18/22  Yes Durenda Hurt, APRN - NP   ibuprofen (IBU) 600 MG tablet Take 1 tablet by mouth every 6 hours as needed for Pain 04/29/22  Yes Alver Sorrow, MD   ondansetron (ZOFRAN-ODT) 4 MG disintegrating tablet Take 1 tablet by mouth 3 times daily as needed for Nausea or Vomiting 04/29/22  Yes Alver Sorrow, MD   albuterol (PROVENTIL) (2.5 MG/3ML) 0.083% nebulizer solution Inhale 3 mLs into the lungs every 4 hours as needed 03/03/19  Yes Automatic Reconciliation, Ar   cetirizine (ZYRTEC) 10 MG tablet Take 1 tablet by mouth daily 10/23/19  Yes Automatic Reconciliation, Ar   vitamin D (CHOLECALCIFEROL) 50000 UNIT CAPS Take 1 capsule by mouth every 7 days 07/08/19  Yes Automatic Reconciliation, Ar   levonorgestrel-ethinyl estradiol (ALTAVERA) 0.15-30 MG-MCG per tablet Take 1 tablet by mouth daily 06/21/20  Yes Automatic Reconciliation, Ar   metFORMIN (GLUCOPHAGE-XR) 500 MG extended release tablet Take 2 tablets by mouth daily 06/21/20  Yes Automatic Reconciliation, Ar      PHQ-9 Total Score: 11 (09/19/2022  3:01 PM)  Thoughts that you would be better off dead, or of hurting yourself in some way: 0 (09/19/2022  3:01 PM)         09/19/2022     3:01 PM 11/21/2021     1:16 PM 03/17/2021     3:40 PM   PHQ-9     Little interest or pleasure in doing things 1 0 1   Feeling down, depressed, or hopeless 1 0 2   Trouble falling or staying asleep, or sleeping too much 2 3 3    Feeling tired or having little energy 2 1 2    Poor appetite or overeating 1 3 2    Feeling bad about yourself - or that you are a failure or have let yourself or your family down 1 1 3    Trouble concentrating on things, such as reading the newspaper or watching television 3 1 3    Moving or speaking so slowly that other people could have noticed. Or the opposite - being so fidgety or restless that you have been moving around a lot more than usual 0 0 0   Thoughts that you would be better off dead, or of hurting yourself in some way 0 0    PHQ-2 Score 2 0 3   PHQ-9 Total Score 11 9 16    If you checked off any problems, how difficult have these problems made it for you to do your work, take care of things at home, or get along with other people? 2 1  09/19/2022     3:01 PM 11/21/2021     1:16 PM   GAD-7 SCREENING   Feeling nervous, anxious, or on edge Several days More than half the days   Not being able to stop or control worrying More than half the days More than half the days   Worrying too much about different things More than half the days More than half the days   Trouble relaxing More than half the days Several days   Being so restless that it is hard to sit still Several days Several days   Becoming easily annoyed or irritable More than half the days Several days   Feeling afraid as if something awful might happen Several days Not at all   GAD-7 Total Score 11 9   How difficult have these problems made it for you to do your work, take care of things at home, or get along with other people? Very difficult Somewhat difficult        "Have you been to the ER, urgent care clinic since your last visit?  Hospitalized since your last visit?"    NO    "Have you seen or consulted any other health care providers outside of [facility] since  your last visit?"    NO

## 2022-09-20 NOTE — Progress Notes (Signed)
CHIEF COMPLAINT:  Lauren Rocha is a 42 y.o. female and was seen today for follow-up of psychiatric condition and psychotropic medication management.     HPI:    Lauren Rocha  reports the following psychiatric symptoms by hx:  depression, anxiety, inattentiveness.  Overall symptoms have been present for years. Currently symptoms are of low to moderate severity. The symptoms occur less frequently and are less severe. 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 Depression has been mostly manageable. She rates her depression at 2-3/10 with 10 being the worst. Her anxiety is somewhat increased with current work stress, Anxiety is rated at 7-8/10 with 10 being the worst. Energy level is good. Concentrate and ability to stay on task is good with medications. She reports compliance with medications. Denies side effects. No complaint of chest pain, dizziness, palpitations, headaches. Patient Appetite:no change from normal. Sleep: fair, averaging about 5 hours. She continues to participate in weekly therapy. Patient denies symptoms of psychosis or mania. Denies any current thoughts of suicide or homicide.     Current Outpatient Medications on File Prior to Visit   Medication Sig Dispense Refill    hydrOXYzine HCl (ATARAX) 25 MG tablet Take 1 tablet by mouth at bedtime      amLODIPine (NORVASC) 2.5 MG tablet Take 2 tablets by mouth daily      amphetamine-dextroamphetamine (ADDERALL) 10 MG tablet Take 1 tablet by mouth 2 times daily for 30 days. Max Daily Amount: 20 mg 60 tablet 0    escitalopram (LEXAPRO) 20 MG tablet Take 1 tablet by mouth daily 90 tablet 0    ibuprofen (IBU) 600 MG tablet Take 1 tablet by mouth every 6 hours as needed for Pain 20 tablet 0    ondansetron (ZOFRAN-ODT) 4 MG disintegrating tablet Take 1 tablet by mouth 3 times daily as needed for Nausea or Vomiting 21 tablet 0    albuterol (PROVENTIL) (2.5 MG/3ML) 0.083% nebulizer solution Inhale 3 mLs into the lungs every 4 hours as needed      cetirizine  (ZYRTEC) 10 MG tablet Take 1 tablet by mouth daily      vitamin D (CHOLECALCIFEROL) 50000 UNIT CAPS Take 1 capsule by mouth every 7 days      levonorgestrel-ethinyl estradiol (ALTAVERA) 0.15-30 MG-MCG per tablet Take 1 tablet by mouth daily      metFORMIN (GLUCOPHAGE-XR) 500 MG extended release tablet Take 2 tablets by mouth daily       No current facility-administered medications on file prior to visit.        Past Medical History:   Diagnosis Date    Anxiety     PCOS (polycystic ovarian syndrome)     Sciatica         Family History   Problem Relation Age of Onset    Cancer Maternal Grandmother         breast    Diabetes Maternal Grandmother     Breast Cancer Maternal Grandmother     Cancer Maternal Aunt         breast     Social History     Socioeconomic History    Marital status: Divorced     Spouse name: Not on file    Number of children: Not on file    Years of education: Not on file    Highest education level: Not on file   Occupational History    Not on file   Tobacco Use    Smoking status: Never    Smokeless  tobacco: Never   Substance and Sexual Activity    Alcohol use: Yes    Drug use: Not on file    Sexual activity: Not on file   Other Topics Concern    Not on file   Social History Narrative    Not on file     Social Determinants of Health     Financial Resource Strain: Not on file   Food Insecurity: Not on file   Transportation Needs: Not on file   Physical Activity: Not on file   Stress: Not on file   Social Connections: Not on file   Intimate Partner Violence: Not on file   Housing Stability: Not on file     REVIEW OF SYSTEMS:  Psychiatric symptoms being monitored for:  depression, anxiety, inattentiveness   Constitutional: No report of fever, or weight gain.  Eyes: No report of acute vision changes.   ENT: No report of hearing changes or difficulty swallowing.   Cardiac: No report of chest pain, edema or orthopnea.   Respiratory: Denies dyspnea, cough or wheeze.   GI: No report of abdominal pain.   GU:  No report of dysuria or hematuria.   Musculoskeletal: No report of joint pain or swelling.   Skin: No report of rash, lesion, or abrasions.   Neurologic: No report of seizures, blackout, numbness.   Endocrine: No report of polyuria or polydipsia.   Hematologic: No report of blood clots or easy bleeding.   Allergy: No report of hives or allergic reaction.   Reproductive: No report of significant issues    BP 123/82 (Site: Left Upper Arm, Position: Sitting, Cuff Size: Large Adult)   Pulse 65   Temp 97.1 F (36.2 C) (Oral)   Resp 16   Ht 1.549 m (5\' 1" )   Wt 97.1 kg (214 lb)   SpO2 97%   BMI 40.43 kg/m     MENTAL STATUS EXAM:   Appearance: Appears stated age. Good grooming.  Involuntary Movements: None  Attitude: Cooperative  Speech: normal  Language: patient able to repeat the sentence "no ifs ands or buts."  Mood:  "frustrated"  Affect: congruent with mood  Thought Process: Logical   Thought Content: no delusion or paranoid thoughts.  Suicidal/ Homicidal Ideations: denied  Thought Perception: no auditory or visual hallucinations  Orientation: alert, oriented to person, place and time.  Recent Memory: Adequate  Remote Memory: Adequate  Attention/Concentration: able to spell word "WORLD" backwards.  Insight/ Judgment: Good    [place]:      09/19/2022     3:01 PM   PHQ-9    Little interest or pleasure in doing things 1   Feeling down, depressed, or hopeless 1   Trouble falling or staying asleep, or sleeping too much 2   Feeling tired or having little energy 2   Poor appetite or overeating 1   Feeling bad about yourself - or that you are a failure or have let yourself or your family down 1   Trouble concentrating on things, such as reading the newspaper or watching television 3   Moving or speaking so slowly that other people could have noticed. Or the opposite - being so fidgety or restless that you have been moving around a lot more than usual 0   Thoughts that you would be better off dead, or of hurting  yourself in some way 0   PHQ-2 Score 2   PHQ-9 Total Score 11   If you checked off any problems,  how difficult have these problems made it for you to do your work, take care of things at home, or get along with other people? 2            09/19/2022     3:01 PM 11/21/2021     1:16 PM   GAD-7 SCREENING   Feeling nervous, anxious, or on edge Several days More than half the days   Not being able to stop or control worrying More than half the days More than half the days   Worrying too much about different things More than half the days More than half the days   Trouble relaxing More than half the days Several days   Being so restless that it is hard to sit still Several days Several days   Becoming easily annoyed or irritable More than half the days Several days   Feeling afraid as if something awful might happen Several days Not at all   GAD-7 Total Score 11 9   How difficult have these problems made it for you to do your work, take care of things at home, or get along with other people? Very difficult Somewhat difficult     MEDICAL DECISION MAKING:  Problems addressed today:    ICD-10-CM    1. Major depressive disorder, recurrent, in partial remission (HCC)  F33.41 topiramate (TOPAMAX) 50 MG tablet         Assessment:   Lauren Rocha  is responding to treatment. Symptoms are improving. Discussed current medications and dosages. Mutually agreed to  add small dose of Topamax for irritability/sleep. Patient to communicate if symptoms worsen or fail to improve before their next scheduled visit. Risks, benefits, and side effects of medications to include drug to drug interactions were reviewed and discussed in detail. Patient encouraged to continue with adequate hydration. Patient agreed that the potential benefit from a medication trial outweighs the acknowledged risks. Reviewed treatment goals and target symptoms to monitor for.     Plan:  1. MEDICATION:        medication changes made today:     Current Outpatient Medications:      hydrOXYzine HCl (ATARAX) 25 MG tablet, Take 1 tablet by mouth at bedtime, Disp: , Rfl:     amLODIPine (NORVASC) 2.5 MG tablet, Take 2 tablets by mouth daily, Disp: , Rfl:     topiramate (TOPAMAX) 50 MG tablet, Take 1 tablet by mouth nightly, Disp: 30 tablet, Rfl: 0    amphetamine-dextroamphetamine (ADDERALL) 10 MG tablet, Take 1 tablet by mouth 2 times daily for 30 days. Max Daily Amount: 20 mg, Disp: 60 tablet, Rfl: 0    escitalopram (LEXAPRO) 20 MG tablet, Take 1 tablet by mouth daily, Disp: 90 tablet, Rfl: 0    ibuprofen (IBU) 600 MG tablet, Take 1 tablet by mouth every 6 hours as needed for Pain, Disp: 20 tablet, Rfl: 0    ondansetron (ZOFRAN-ODT) 4 MG disintegrating tablet, Take 1 tablet by mouth 3 times daily as needed for Nausea or Vomiting, Disp: 21 tablet, Rfl: 0    albuterol (PROVENTIL) (2.5 MG/3ML) 0.083% nebulizer solution, Inhale 3 mLs into the lungs every 4 hours as needed, Disp: , Rfl:     cetirizine (ZYRTEC) 10 MG tablet, Take 1 tablet by mouth daily, Disp: , Rfl:     vitamin D (CHOLECALCIFEROL) 50000 UNIT CAPS, Take 1 capsule by mouth every 7 days, Disp: , Rfl:     levonorgestrel-ethinyl estradiol (ALTAVERA) 0.15-30 MG-MCG per tablet, Take 1 tablet by  mouth daily, Disp: , Rfl:     metFORMIN (GLUCOPHAGE-XR) 500 MG extended release tablet, Take 2 tablets by mouth daily, Disp: , Rfl:     2.  Counseling and coordination of care including instructions for treatment, risks/benefits, risk factor reduction and patient/family education. She agrees with the plan. Patient instructed to call with any side effects, questions or issues.   3.PSYCHOTHERAPY: Patient was provided with supportive therapy, strongly encourage to seek psychotherapy.    4. MEDICAL CARE: Patient was strongly encourage to take their medical medications and follow up with their PCP on regular basis.    5. SUBSTANCE ABUSE CARE: Patient denies a smoking, drinking, abusing any illicit drugs.  6. Patient was informed that in case of  emergency to go to the nearest ER or call 911.     Patient was given time to ask questions and voice concerns. I believe all questions concerns were adequately addressed at this office visit. Patient verbalized agreement and understanding of the above-stated plan.    Follow-up and Dispositions    Return in about 6 weeks (around 10/31/2022).         09/20/2022  Durenda Hurt, APRN - NP

## 2022-10-14 ENCOUNTER — Encounter

## 2022-10-16 MED ORDER — TOPIRAMATE 50 MG PO TABS
50 | ORAL_TABLET | Freq: Every evening | ORAL | 1 refills | Status: AC
Start: 2022-10-16 — End: ?

## 2022-10-21 ENCOUNTER — Encounter

## 2022-10-23 MED ORDER — AMPHETAMINE-DEXTROAMPHETAMINE 10 MG PO TABS
10 MG | ORAL_TABLET | Freq: Two times a day (BID) | ORAL | 0 refills | Status: DC
Start: 2022-10-23 — End: 2022-12-13

## 2022-10-24 ENCOUNTER — Telehealth
Admit: 2022-10-24 | Discharge: 2022-10-24 | Payer: BLUE CROSS/BLUE SHIELD | Attending: Psychiatric/Mental Health | Primary: Internal Medicine

## 2022-10-24 DIAGNOSIS — F3341 Major depressive disorder, recurrent, in partial remission: Secondary | ICD-10-CM

## 2022-10-24 NOTE — Progress Notes (Signed)
 Lauren Rocha, was evaluated through a synchronous (real-time) audio-video encounter. The patient (or guardian if applicable) is aware that this is a billable service, which includes applicable co-pays. This Virtual Visit was conducted with patient's (and/or legal guardian's) consent. Patient identification was verified, and a caregiver was present when appropriate.     The patient was located at Home: [address]  [place] [address]  Provider was located at [employer] (Appt Dept): [address]  [place],  [place] [address]  Confirm you are appropriately licensed, registered, or certified to deliver care in the state where the patient is located as indicated above. If you are not or unsure, please re-schedule the visit: Yes, I confirm.     CHIEF COMPLAINT:  Lauren Rocha is a 42 y.o. female and was seen today for follow-up of psychiatric condition and psychotropic medication management.     HPI:    Shawnay  reports the following psychiatric symptoms by hx:  depression, anxiety, inattentiveness.  Overall symptoms have been present for years. Currently symptoms are of moderate severity. The symptoms occur less frequently and are less severe. Patient reports that she has received a merit and increase in her wages which has relieves some of her financial worries.  Depression  and anxiety have been more manageable. She rates her depression at 3/10 with 10 being the worst. Anxiety is rated at 5/10 with 10 being the worst. Energy level is good. Concentrate and ability to stay on task is good with medications.  Mood is better. She reports compliance with medications. She reports noticing some increase in palpitations. Denies any dizziness, chest discomfort, or headaches. Patient Appetite: decreased from normal. She reports having to place self on a schedule to remind her of eating. Sleep: improved. She continues to participate in weekly therapy. Patient states that she was able to take  some time off and feels this has been beneficial. Patient denies symptoms of psychosis or mania. Denies any current thoughts of suicide or homicide.     Current Outpatient Medications on File Prior to Visit   Medication Sig Dispense Refill    amphetamine -dextroamphetamine  (ADDERALL) 10 MG tablet Take 1 tablet by mouth 2 times daily for 30 days. Max Daily Amount: 20 mg 60 tablet 0    topiramate  (TOPAMAX ) 50 MG tablet TAKE 1 TABLET BY MOUTH EVERY DAY AT NIGHT 90 tablet 1    hydrOXYzine HCl (ATARAX) 25 MG tablet Take 1 tablet by mouth at bedtime      amLODIPine (NORVASC) 2.5 MG tablet Take 2 tablets by mouth daily      escitalopram  (LEXAPRO ) 20 MG tablet Take 1 tablet by mouth daily 90 tablet 0    ibuprofen  (IBU) 600 MG tablet Take 1 tablet by mouth every 6 hours as needed for Pain 20 tablet 0    ondansetron  (ZOFRAN -ODT) 4 MG disintegrating tablet Take 1 tablet by mouth 3 times daily as needed for Nausea or Vomiting 21 tablet 0    albuterol (PROVENTIL) (2.5 MG/3ML) 0.083% nebulizer solution Inhale 3 mLs into the lungs every 4 hours as needed      cetirizine (ZYRTEC) 10 MG tablet Take 1 tablet by mouth daily      vitamin D (CHOLECALCIFEROL) 50000 UNIT CAPS Take 1 capsule by mouth every 7 days      levonorgestrel-ethinyl estradiol (ALTAVERA) 0.15-30 MG-MCG per tablet Take 1 tablet by mouth daily      metFORMIN (GLUCOPHAGE-XR) 500 MG extended release tablet Take 2  tablets by mouth daily       No current facility-administered medications on file prior to visit.        Past Medical History:   Diagnosis Date    Anxiety     PCOS (polycystic ovarian syndrome)     Sciatica         Family History   Problem Relation Age of Onset    Cancer Maternal Grandmother         breast    Diabetes Maternal Grandmother     Breast Cancer Maternal Grandmother     Cancer Maternal Aunt         breast          Social History     Socioeconomic History    Marital status: Divorced     Spouse name: Not on file    Number of children: Not on file     Years of education: Not on file    Highest education level: Not on file   Occupational History    Not on file   Tobacco Use    Smoking status: Never    Smokeless tobacco: Never   Substance and Sexual Activity    Alcohol use: Yes    Drug use: Not on file    Sexual activity: Not on file   Other Topics Concern    Not on file   Social History Narrative    Not on file     Social Determinants of Health     Financial Resource Strain: Not on file   Food Insecurity: Not on file   Transportation Needs: Not on file   Physical Activity: Not on file   Stress: Not on file   Social Connections: Not on file   Intimate Partner Violence: Not on file   Housing Stability: Not on file     REVIEW OF SYSTEMS:  Psychiatric symptoms being monitored for:  depression, anxiety, irritability, sleep disturbances  Constitutional: No report of fever, or weight gain.  Eyes: No report of acute vision changes.   ENT: No report of hearing changes or difficulty swallowing.   Cardiac: No report of chest pain, edema or orthopnea.   Respiratory: Denies dyspnea, cough or wheeze.   GI: No report of abdominal pain.   GU: No report of dysuria or hematuria.   Musculoskeletal: No report of joint pain or swelling.   Skin: No report of rash, lesion, or abrasions.   Neurologic: No report of seizures, blackout, numbness.   Endocrine: No report of polyuria or polydipsia.   Hematologic: No report of blood clots or easy bleeding.   Allergy: No report of hives or allergic reaction.   Reproductive: No report of significant issues    There were no vitals taken for this visit.    MENTAL STATUS EXAM:   Appearance: Appears stated age. Good grooming.  Involuntary Movements: None  Attitude: Cooperative  Speech: normal  Language: patient able to repeat the sentence no ifs ands or buts.  Mood:  better  Affect: congruent with mood  Thought Process: Logical   Thought Content: no delusion or paranoid thoughts.  Suicidal/ Homicidal Ideations: denied  Thought Perception: no  auditory or visual hallucinations  Orientation: alert, oriented to person, place and time.  Recent Memory: Adequate  Remote Memory: Adequate  Attention/Concentration: able to spell word "WORLD" backwards.  Insight/ Judgment: Good    [place]:      09/19/2022     3:01 PM   PHQ-9  Little interest or pleasure in doing things 1   Feeling down, depressed, or hopeless 1   Trouble falling or staying asleep, or sleeping too much 2   Feeling tired or having little energy 2   Poor appetite or overeating 1   Feeling bad about yourself - or that you are a failure or have let yourself or your family down 1   Trouble concentrating on things, such as reading the newspaper or watching television 3   Moving or speaking so slowly that other people could have noticed. Or the opposite - being so fidgety or restless that you have been moving around a lot more than usual 0   Thoughts that you would be better off dead, or of hurting yourself in some way 0   PHQ-2 Score 2   PHQ-9 Total Score 11   If you checked off any problems, how difficult have these problems made it for you to do your work, take care of things at home, or get along with other people? 2            09/19/2022     3:01 PM 11/21/2021     1:16 PM   GAD-7 SCREENING   Feeling nervous, anxious, or on edge Several days More than half the days   Not being able to stop or control worrying More than half the days More than half the days   Worrying too much about different things More than half the days More than half the days   Trouble relaxing More than half the days Several days   Being so restless that it is hard to sit still Several days Several days   Becoming easily annoyed or irritable More than half the days Several days   Feeling afraid as if something awful might happen Several days Not at all   GAD-7 Total Score 11 9   How difficult have these problems made it for you to do your work, take care of things at home, or get along with other people? Very difficult Somewhat  difficult        MEDICAL DECISION MAKING:  Problems addressed today:      ICD-10-CM    1. Major depressive disorder, recurrent, in partial remission (HCC)  F33.41       2. Attention-deficit hyperactivity disorder, combined type  F90.2       3. Generalized anxiety disorder  F41.1            Assessment:   The patient is responding positively to treatment, with noticeable improvement in symptoms. During the consultation, we reviewed the current medications and dosages. It was mutually agreed to decrease the Adderall dosage to 5 mg BID. The patient has an adequate supply of Adderall 10 mg, which was just filled on 9/23, and has been instructed to take 1/2 tablet twice daily until the next appointment in 4 weeks. There are no changes to the topiramate  or escitalopram  prescriptions. The patient has been instructed to monitor their blood pressure and keep a log of readings for review at the next appointment.     The patient should communicate if symptoms worsen or do not improve before the next scheduled visit. We reviewed and discussed in detail the risks, benefits, and side effects of the medications, including potential drug-to-drug interactions. The patient agreed that the potential benefits of the medication trial outweigh the acknowledged risks. Lastly, we reviewed the treatment goals and the target symptoms to monitor.     Plan:  1. MEDICATION:        medication changes made today:     Current Outpatient Medications:     amphetamine -dextroamphetamine  (ADDERALL) 10 MG tablet, Take 1 tablet by mouth 2 times daily for 30 days. Max Daily Amount: 20 mg, Disp: 60 tablet, Rfl: 0    topiramate  (TOPAMAX ) 50 MG tablet, TAKE 1 TABLET BY MOUTH EVERY DAY AT NIGHT, Disp: 90 tablet, Rfl: 1    hydrOXYzine HCl (ATARAX) 25 MG tablet, Take 1 tablet by mouth at bedtime, Disp: , Rfl:     amLODIPine (NORVASC) 2.5 MG tablet, Take 2 tablets by mouth daily, Disp: , Rfl:     escitalopram  (LEXAPRO ) 20 MG tablet, Take 1 tablet by mouth daily,  Disp: 90 tablet, Rfl: 0    ibuprofen  (IBU) 600 MG tablet, Take 1 tablet by mouth every 6 hours as needed for Pain, Disp: 20 tablet, Rfl: 0    ondansetron  (ZOFRAN -ODT) 4 MG disintegrating tablet, Take 1 tablet by mouth 3 times daily as needed for Nausea or Vomiting, Disp: 21 tablet, Rfl: 0    albuterol (PROVENTIL) (2.5 MG/3ML) 0.083% nebulizer solution, Inhale 3 mLs into the lungs every 4 hours as needed, Disp: , Rfl:     cetirizine (ZYRTEC) 10 MG tablet, Take 1 tablet by mouth daily, Disp: , Rfl:     vitamin D (CHOLECALCIFEROL) 50000 UNIT CAPS, Take 1 capsule by mouth every 7 days, Disp: , Rfl:     levonorgestrel-ethinyl estradiol (ALTAVERA) 0.15-30 MG-MCG per tablet, Take 1 tablet by mouth daily, Disp: , Rfl:     metFORMIN (GLUCOPHAGE-XR) 500 MG extended release tablet, Take 2 tablets by mouth daily, Disp: , Rfl:     2.  Counseling and coordination of care including instructions for treatment, risks/benefits, risk factor reduction and patient/family education. She agrees with the plan. Patient instructed to call with any side effects, questions or issues.   3.PSYCHOTHERAPY: Patient was provided with supportive therapy, strongly encourage to seek psychotherapy.  Patient refused psychotherapy at this time.  4. MEDICAL CARE: Patient was strongly encourage to take their medical medications and follow up with their PCP on regular basis.    5. SUBSTANCE ABUSE CARE: Patient denies a smoking, drinking, abusing any illicit drugs.  6. Patient was informed that in case of emergency to go to the nearest ER or call 911.     Patient was given time to ask questions and voice concerns. I believe all questions concerns were adequately addressed at this office visit. Patient verbalized agreement and understanding of the above-stated plan.    Follow-up and Dispositions    Return in about 4 weeks (around 11/21/2022).       10/24/2022  Jock Spire, APRN - NP

## 2022-12-13 ENCOUNTER — Encounter

## 2022-12-13 MED ORDER — AMPHETAMINE-DEXTROAMPHETAMINE 10 MG PO TABS
10 | ORAL_TABLET | Freq: Two times a day (BID) | ORAL | 0 refills | Status: AC
Start: 2022-12-13 — End: 2023-01-12

## 2022-12-13 MED ORDER — ESCITALOPRAM OXALATE 20 MG PO TABS
20 | ORAL_TABLET | Freq: Every day | ORAL | 0 refills | Status: AC
Start: 2022-12-13 — End: ?

## 2022-12-13 NOTE — Telephone Encounter (Signed)
 Chief Complaint   Patient presents with    Medication Refill       Requested Prescriptions     Pending Prescriptions Disp Refills    amphetamine-dextroamphetamine (ADDERALL) 10 MG tablet 60 tablet 0     Sig: Take 1 tablet by mouth 2 times daily for 30 days

## 2023-01-05 ENCOUNTER — Inpatient Hospital Stay
Admit: 2023-01-05 | Discharge: 2023-01-06 | Disposition: A | Discharge status: Home / Self Care | Payer: BLUE CROSS/BLUE SHIELD | Admitting: Emergency Medicine

## 2023-01-05 DIAGNOSIS — N764 Abscess of vulva: Secondary | ICD-10-CM

## 2023-01-05 NOTE — ED Triage Notes (Signed)
 Ambulatory pt c/o cyst to L labia x 1 day. Pt reports swelling has drastically increased since this morning. Denies discharge or bleeding

## 2023-01-05 NOTE — ED Notes (Signed)
This procedure has been fully reviewed with the patient and written informed consent has been obtained.

## 2023-01-05 NOTE — ED Notes (Signed)
 Discharge teaching provided to patient regarding treatment received,medications prescribed, and proper follow-up care. Patient verbalized understanding directions and follow up care. Patient left ambulatory with discharge paperwork in hand.

## 2023-01-05 NOTE — ED Provider Notes (Signed)
`  [facility]  eMERGENCY dEPARTMENT eNCOUnter      Pt Name: Lauren Rocha  MRN: [id number]  Birthdate 1981-01-22 of evaluation: 01/05/2023  Provider:Meliya Mcconahy B. Manson Passey, MD    CHIEF COMPLAINT       HPI    Lauren Rocha is a 42 y.o. female presents

## 2023-01-06 MED ORDER — KETOROLAC TROMETHAMINE 15 MG/ML IJ SOLN
15 | Freq: Once | INTRAMUSCULAR | Status: DC
Start: 2023-01-06 — End: 2023-01-05

## 2023-01-06 MED ORDER — KETOROLAC TROMETHAMINE 15 MG/ML IJ SOLN
15 | Freq: Once | INTRAMUSCULAR | Status: AC
Start: 2023-01-06 — End: 2023-01-05
  Administered 2023-01-06: 02:00:00 15 mg via INTRAMUSCULAR

## 2023-01-06 MED ORDER — DOXYCYCLINE HYCLATE 100 MG PO CAPS
100 MG | ORAL | Status: AC
Start: 2023-01-06 — End: 2023-01-05
  Administered 2023-01-06: 02:00:00 100 mg via ORAL

## 2023-01-06 MED ORDER — DOXYCYCLINE HYCLATE 100 MG PO TABS
100 | ORAL_TABLET | Freq: Two times a day (BID) | ORAL | 0 refills | Status: AC
Start: 2023-01-06 — End: 2023-01-15

## 2023-01-06 MED ORDER — FLUCONAZOLE 150 MG PO TABS
150 | ORAL_TABLET | Freq: Once | ORAL | 0 refills | Status: AC
Start: 2023-01-06 — End: 2023-01-05

## 2023-01-06 MED ORDER — HYDROCODONE-ACETAMINOPHEN 5-325 MG PO TABS
5-325 | ORAL_TABLET | Freq: Four times a day (QID) | ORAL | 0 refills | Status: AC | PRN
Start: 2023-01-06 — End: 2023-01-08

## 2023-01-06 MED FILL — DOXYCYCLINE HYCLATE 100 MG PO CAPS: 100 MG | ORAL | Qty: 1 | Fill #0

## 2023-01-06 MED FILL — KETOROLAC TROMETHAMINE 15 MG/ML IJ SOLN: 15 MG/ML | INTRAMUSCULAR | Qty: 1 | Fill #0

## 2023-01-12 ENCOUNTER — Telehealth
Admit: 2023-01-12 | Discharge: 2023-01-12 | Payer: BLUE CROSS/BLUE SHIELD | Attending: Psychiatric/Mental Health | Primary: Internal Medicine

## 2023-01-12 DIAGNOSIS — F902 Attention-deficit hyperactivity disorder, combined type: Secondary | ICD-10-CM

## 2023-01-12 NOTE — Progress Notes (Signed)
 Lauren Rocha, was evaluated through a synchronous (real-time) audio-video encounter. The patient (or guardian if applicable) is aware that this is a billable service, which includes applicable co-pays. This Virtual Visit was conducted with patient's (a

## 2023-01-12 NOTE — Progress Notes (Signed)
 Chief Complaint   Patient presents with    Medication Check     Prior to Admission medications    Medication Sig Start Date End Date Taking? Authorizing Provider   fluconazole (DIFLUCAN) 150 MG tablet TAKE 1 TABLET BY MOUTH ONCE FOR 1 DOSE ON DAY 1 OF INFE

## 2023-01-16 NOTE — Telephone Encounter (Signed)
 Called patient and left a detailed voice message requesting callback for preferred pharmacy.Provider unable to verify submitting e-prescriptions with [facility].Provider requesting patient to choose an retail pharmacy ([facility], [place]) loc

## 2023-01-27 ENCOUNTER — Emergency Department (HOSPITAL_COMMUNITY): Payer: Self-pay

## 2023-01-27 ENCOUNTER — Other Ambulatory Visit: Payer: Self-pay

## 2023-01-27 ENCOUNTER — Encounter (HOSPITAL_COMMUNITY): Payer: Self-pay

## 2023-01-27 ENCOUNTER — Emergency Department (HOSPITAL_COMMUNITY)
Admission: EM | Admit: 2023-01-27 | Discharge: 2023-01-28 | Discharge status: Against Medical Advice | Payer: Self-pay | Attending: Emergency Medicine | Admitting: Emergency Medicine

## 2023-01-27 DIAGNOSIS — J101 Influenza due to other identified influenza virus with other respiratory manifestations: Secondary | ICD-10-CM | POA: Insufficient documentation

## 2023-01-27 DIAGNOSIS — R0981 Nasal congestion: Secondary | ICD-10-CM | POA: Insufficient documentation

## 2023-01-27 DIAGNOSIS — Z1152 Encounter for screening for COVID-19: Secondary | ICD-10-CM | POA: Insufficient documentation

## 2023-01-27 DIAGNOSIS — R059 Cough, unspecified: Secondary | ICD-10-CM | POA: Insufficient documentation

## 2023-01-27 DIAGNOSIS — M791 Myalgia, unspecified site: Secondary | ICD-10-CM | POA: Insufficient documentation

## 2023-01-27 DIAGNOSIS — Z5321 Procedure and treatment not carried out due to patient leaving prior to being seen by health care provider: Secondary | ICD-10-CM | POA: Insufficient documentation

## 2023-01-27 NOTE — ED Triage Notes (Signed)
Pt reports being sick since Christmas eve with c/o dry cough, congestion, and body aches unrelieved with home nebulizers.

## 2023-01-28 LAB — RESP PANEL BY RT-PCR (RSV, FLU A&B, COVID)  RVPGX2
Influenza A by PCR: POSITIVE — AB
Influenza B by PCR: NEGATIVE
Resp Syncytial Virus by PCR: NEGATIVE
SARS Coronavirus 2 by RT PCR: NEGATIVE

## 2023-01-28 NOTE — ED Notes (Signed)
Pt decided to leave while waiting to go to a room.

## 2023-02-05 ENCOUNTER — Encounter: Admit: 2023-02-05 | Admitting: Psychiatric/Mental Health

## 2023-02-05 DIAGNOSIS — F902 Attention-deficit hyperactivity disorder, combined type: Secondary | ICD-10-CM

## 2023-02-06 ENCOUNTER — Encounter: Admit: 2023-02-06 | Admitting: Psychiatric/Mental Health

## 2023-02-06 DIAGNOSIS — F902 Attention-deficit hyperactivity disorder, combined type: Secondary | ICD-10-CM

## 2023-02-06 MED ORDER — AMPHETAMINE-DEXTROAMPHETAMINE 10 MG PO TABS
10 | ORAL_TABLET | Freq: Two times a day (BID) | ORAL | 0 refills | Status: AC
Start: 2023-02-06 — End: 2023-03-08

## 2023-02-06 MED ORDER — AMPHETAMINE-DEXTROAMPHETAMINE 10 MG PO TABS
10 | ORAL_TABLET | Freq: Two times a day (BID) | ORAL | 0 refills | Status: AC
Start: 2023-02-06 — End: 2023-05-07

## 2023-02-06 MED ORDER — AMPHETAMINE-DEXTROAMPHETAMINE 10 MG PO TABS
10 | ORAL_TABLET | Freq: Two times a day (BID) | ORAL | 0 refills | Status: AC
Start: 2023-02-06 — End: 2023-04-07

## 2023-02-06 MED ORDER — ESCITALOPRAM OXALATE 20 MG PO TABS
20 | ORAL_TABLET | Freq: Every day | ORAL | 0 refills | Status: AC
Start: 2023-02-06 — End: ?

## 2023-03-07 ENCOUNTER — Emergency Department (HOSPITAL_COMMUNITY)
Admission: EM | Admit: 2023-03-07 | Discharge: 2023-03-08 | Disposition: A | Discharge status: Home / Self Care | Payer: Self-pay | Attending: Emergency Medicine | Admitting: Emergency Medicine

## 2023-03-07 ENCOUNTER — Other Ambulatory Visit: Payer: Self-pay

## 2023-03-07 ENCOUNTER — Encounter (HOSPITAL_COMMUNITY): Payer: Self-pay | Admitting: Emergency Medicine

## 2023-03-07 DIAGNOSIS — L0291 Cutaneous abscess, unspecified: Secondary | ICD-10-CM

## 2023-03-07 DIAGNOSIS — L0231 Cutaneous abscess of buttock: Secondary | ICD-10-CM | POA: Insufficient documentation

## 2023-03-07 NOTE — ED Triage Notes (Signed)
 Pt reports she was seen at [facility] yesterday for abscess to vaginal area.  Reports she had an US  and they told her there was nothing to drain at that time, diagnosed with cellulitis, given toradol  and anbx.  Pt reports area has began to swell more and spread back to buttock area, describes some skin sloughing and increased pain.

## 2023-03-08 LAB — CBC WITH DIFFERENTIAL/PLATELET
Abs Immature Granulocytes: 0.03 10*3/uL (ref 0.00–0.07)
Basophils Absolute: 0 10*3/uL (ref 0.0–0.1)
Basophils Relative: 0 %
Eosinophils Absolute: 0.1 10*3/uL (ref 0.0–0.5)
Eosinophils Relative: 2 %
HCT: 41 % (ref 36.0–46.0)
Hemoglobin: 13 g/dL (ref 12.0–15.0)
Immature Granulocytes: 0 %
Lymphocytes Relative: 40 %
Lymphs Abs: 2.6 10*3/uL (ref 0.7–4.0)
MCH: 28.2 pg (ref 26.0–34.0)
MCHC: 31.7 g/dL (ref 30.0–36.0)
MCV: 88.9 fL (ref 80.0–100.0)
Monocytes Absolute: 0.6 10*3/uL (ref 0.1–1.0)
Monocytes Relative: 10 %
Neutro Abs: 3.2 10*3/uL (ref 1.7–7.7)
Neutrophils Relative %: 48 %
Platelets: 417 10*3/uL — ABNORMAL HIGH (ref 150–400)
RBC: 4.61 MIL/uL (ref 3.87–5.11)
RDW: 12.6 % (ref 11.5–15.5)
WBC: 6.7 10*3/uL (ref 4.0–10.5)
nRBC: 0 % (ref 0.0–0.2)

## 2023-03-08 LAB — BASIC METABOLIC PANEL
Anion gap: 9 (ref 5–15)
BUN: 15 mg/dL (ref 6–20)
CO2: 21 mmol/L — ABNORMAL LOW (ref 22–32)
Calcium: 8.7 mg/dL — ABNORMAL LOW (ref 8.9–10.3)
Chloride: 107 mmol/L (ref 98–111)
Creatinine, Ser: 0.97 mg/dL (ref 0.44–1.00)
GFR, Estimated: >60 mL/min (ref >60)
Glucose, Bld: 86 mg/dL (ref 70–99)
Potassium: 4.2 mmol/L (ref 3.5–5.1)
Sodium: 137 mmol/L (ref 135–145)

## 2023-03-08 MED ORDER — BACITRACIN ZINC 500 UNIT/GM EX OINT
TOPICAL_OINTMENT | Freq: Two times a day (BID) | CUTANEOUS | Status: DC
  Administered 2023-03-08: 1 via TOPICAL
  Filled 2023-03-08: qty 0.9

## 2023-03-08 MED ORDER — HYDROCODONE-ACETAMINOPHEN 5-325 MG PO TABS
1.0000 | ORAL_TABLET | Freq: Once | ORAL | Status: AC
  Administered 2023-03-08: 1 via ORAL
  Filled 2023-03-08: qty 1

## 2023-03-08 MED ORDER — LIDOCAINE HCL (PF) 1 % IJ SOLN
5.0000 mL | Freq: Once | INTRAMUSCULAR | Status: AC
  Administered 2023-03-08: 5 mL via INTRADERMAL
  Filled 2023-03-08: qty 30

## 2023-03-08 NOTE — ED Provider Notes (Signed)
 [place] EMERGENCY DEPARTMENT AT [facility] Provider Note   CSN: [id number] Arrival date & time: 03/07/23  2326     History  Chief Complaint  Patient presents with   Abscess    Kim Klein is Klein 43 y.o. female history of PCOS, GAD presented with abscess near her labral area for the past 3 days.  Patient went to [facility] yesterday and had an ultrasound done that did not show any pockets of pus and so she was given antibiotics along with Toradol  for cellulitis however states that when she got home she started having purulent material coming out of the area.  Patient denies fevers since being discharged but states when she was there yesterday she had Klein fever but is unsure of what.  Patient that she does have history of this back in December however states she did not finish the antibiotics then and thinks that may be the cause of this.  Patient denies abdominal pain nausea vomiting.  Home Medications Prior to Admission medications   Medication Sig Start Date End Date Taking? Authorizing Provider  albuterol  (PROVENTIL  HFA;VENTOLIN  HFA) 108 (90 Base) MCG/ACT inhaler Inhale 2 puffs into the lungs every 6 (six) hours as needed for wheezing or shortness of breath. 05/15/18   Kim Lye A, FNP  cetirizine  (ZYRTEC ) 10 MG tablet TAKE 1 TABLET BY MOUTH EVERY DAY 11/12/17   Kim Potter M, DO  escitalopram  (LEXAPRO ) 20 MG tablet Take 1 tablet (20 mg total) by mouth daily. 12/21/17   Kim Potter HERO, DO  fluconazole  (DIFLUCAN ) 150 MG tablet Take 1 tablet (150 mg total) by mouth every three (3) days as needed. 12/08/18   Kim Lye LABOR, FNP  levonorgestrel -ethinyl estradiol (ALTAVERA) 0.15-30 MG-MCG tablet Take 1 tablet by mouth daily. (Needs to be seen before next refill) 03/13/19   Kim Potter HERO, DO  LORazepam  (ATIVAN ) 0.5 MG tablet Take 1 tablet (0.5 mg total) by mouth 2 (two) times daily as needed for anxiety. Patient not taking: Reported on 06/20/2018 06/05/17   Kim Potter HERO, DO  metFORMIN  (GLUCOPHAGE -XR) 500 MG 24 hr tablet Take 2 tablets (1,000 mg total) by mouth daily with supper. 11/21/17   Kim Potter HERO, DO  metroNIDAZOLE  (FLAGYL ) 500 MG tablet Take 1 tablet (500 mg total) by mouth 2 (two) times daily. 07/19/18   Webb, Padonda B, FNP  phentermine  (ADIPEX-P ) 37.5 MG tablet Take 0.5-1 tablets (18.75-37.5 mg total) by mouth daily before breakfast. 06/20/18   Kim Lye A, FNP  Vitamin D , Ergocalciferol , (DRISDOL ) 1.25 MG (50000 UT) CAPS capsule Take 1 capsule (50,000 Units total) by mouth every 7 (seven) days. 07/15/18   Kim Potter HERO, DO      Allergies    Augmentin [amoxicillin-pot clavulanate], Cinnamon, and Strawberry extract    Review of Systems   Review of Systems  Physical Exam Updated Vital Signs BP (!) 128/90   Pulse 80   Temp 98.3 F (36.8 C)   Resp 17   Ht 5' 1 (1.549 Klein)   Wt 100.2 kg   SpO2 100%   BMI 41.76 kg/Klein  Physical Exam Constitutional:      General: She is not in acute distress. Abdominal:     Palpations: Abdomen is soft.     Tenderness: There is no abdominal tenderness. There is no guarding or rebound.  Genitourinary:    Comments: Chaperone: Kim Klein, NT Areas of fluctuance, erythema and tenderness from left gluteal fold extending close to the left labia No  signs of perineum involvement No crepitus noted Neurological:     Mental Status: She is alert.     ED Results / Procedures / Treatments   Labs (all labs ordered are listed, but only abnormal results are displayed) Labs Reviewed  CBC WITH DIFFERENTIAL/PLATELET - Abnormal; Notable for the following components:      Result Value   Platelets 417 (*)    All other components within normal limits  BASIC METABOLIC PANEL - Abnormal; Notable for the following components:   CO2 21 (*)    Calcium 8.7 (*)    All other components within normal limits    EKG None  Radiology No results found.  Procedures .Incision and  Drainage  Date/Time: 03/08/2023 3:53 AM  Performed by: Kim Lynwood DASEN, PA-C Authorized by: Kim Lynwood DASEN, PA-C   Consent:    Consent obtained:  Verbal   Consent given by:  Patient   Risks, benefits, and alternatives were discussed: yes     Risks discussed:  Bleeding, damage to other organs, infection, incomplete drainage and pain Universal protocol:    Procedure explained and questions answered to patient or proxy's satisfaction: yes     Patient identity confirmed:  Verbally with patient Location:    Type:  Abscess   Size:  4.0   Location:  Anogenital   Anogenital location: Left buttocks. Pre-procedure details:    Skin preparation:  Chlorhexidine with alcohol Sedation:    Sedation type:  None Anesthesia:    Anesthesia method:  Local infiltration   Local anesthetic:  Lidocaine  1% w/o epi Procedure type:    Complexity:  Complex Procedure details:    Ultrasound guidance: no     Needle aspiration: no     Incision types:  Single straight   Incision depth:  Dermal   Wound management:  Probed and deloculated   Drainage:  Bloody and purulent   Wound treatment:  Wound left open   Packing materials:  1/4 in iodoform gauze   Amount 1/4 iodoform:  4 inches Post-procedure details:    Procedure completion:  Tolerated     Medications Ordered in ED Medications  bacitracin  ointment (has no administration in time range)  lidocaine  (PF) (XYLOCAINE ) 1 % injection 5 mL (5 mLs Intradermal Given 03/08/23 0256)    ED Course/ Medical Decision Making/ Klein&P                                 Medical Decision Making Amount and/or Complexity of Data Reviewed Labs: ordered.   Kim Klein 43 y.o. presented today for an abscess.  Working DDx that I considered at this time includes, but not limited to, abscess, cellulitis, erysipelas, HS, folliculitis, lymphangitis, necrotizing fasciitis/cellulitis, Fournier's, DVT.  R/o DDx: cellulitis, erysipelas, HS, folliculitis, lymphangitis,  necrotizing fasciitis/cellulitis, Fournier's, DVT: These are considered less likely due to history of present illness, physical exam, labs/imaging findings  Review of prior external notes: 03/06/2023 ED  Unique Tests and My Independent Interpretation:  CBC: Unremarkable BMP: Unremarkable  Social Determinants of Health: none  Discussion with Independent Historian:  Family member  Discussion of Management of Tests: None  Risk: Medium: Prescription medication use  Risk Stratification Score: None  Plan: On exam patient was no acute distress stable vitals.  Upon chart review it appears patient was afebrile yesterday at [facility] but she had Klein temperature of 97.9 per patient's chart.  With Klein chaperone present evaluate the abscess  and it does appear to be on the left gluteal fold.  There does appear to be dried purulent material around with fluctuance noted in tenderness to palpation.  Exam was not consistent with Fournier's as there is no crepitus or signs of necrotizing cellulitis.  I discussed with the patient the risks and benefit of doing an incision and drainage and patient with full decision-making capacity verbalized their understanding symptoms and want to proceed with incision and drainage.  I&D was performed and does show purulent material which does indicate antibiotic use.  Encourage patient continues the doxycycline  prescribed by previous provider.  I spoke with patient following up in 2 days for wound check.  I discussed with the patient red flag symptoms to be aware of including fevers, worsening of abscess, etc. patient verbalized understanding of this.  I encouraged patient to use heat packs to help with the draining.  Patient was given return precautions. Patient stable for discharge at this time.  Patient verbalized understanding of plan.  This chart was dictated using voice recognition software.  Despite best efforts to proofread,  errors can occur which can change the  documentation meaning.        Final Clinical Impression(s) / ED Diagnoses Final diagnoses:  Abscess    Rx / DC Orders ED Discharge Orders     None         Kim Lynwood ONEIDA DEVONNA 03/08/23 0354    Emil Share, DO 03/08/23 [phone]

## 2023-03-08 NOTE — Discharge Instructions (Signed)
 We saw you in the ER for the abscess. We drained the abscess, and have packed the wound. Keep the packing in there for 2 days, and see a primary care doctor or return to the ER for the packing removal and wound recheck.  Please continue the antibiotics you are prescribed by the previous provider.  Keep the wound clean and dry otherwise, and allow for any drainage to occur. Please follow up with primary care provider to be rechecked in the next week to ensure you are healing properly. If symptoms change or worsen, please return to ER.

## 2023-03-10 ENCOUNTER — Encounter (HOSPITAL_COMMUNITY): Payer: Self-pay

## 2023-03-10 ENCOUNTER — Emergency Department (HOSPITAL_COMMUNITY)
Admission: EM | Admit: 2023-03-10 | Discharge: 2023-03-10 | Disposition: A | Discharge status: Home / Self Care | Payer: Self-pay | Attending: Emergency Medicine | Admitting: Emergency Medicine

## 2023-03-10 ENCOUNTER — Other Ambulatory Visit: Payer: Self-pay

## 2023-03-10 DIAGNOSIS — Z4800 Encounter for change or removal of nonsurgical wound dressing: Secondary | ICD-10-CM | POA: Insufficient documentation

## 2023-03-10 DIAGNOSIS — Z5189 Encounter for other specified aftercare: Secondary | ICD-10-CM

## 2023-03-10 DIAGNOSIS — Z7984 Long term (current) use of oral hypoglycemic drugs: Secondary | ICD-10-CM | POA: Insufficient documentation

## 2023-03-10 NOTE — ED Triage Notes (Addendum)
 Pt states that she is here to have packing removed from her vaginal abscess.

## 2023-03-10 NOTE — ED Provider Notes (Signed)
 [place] EMERGENCY DEPARTMENT AT [facility] Provider Note   CSN: [id number] Arrival date & time: 03/10/23  1234     History  Chief Complaint  Patient presents with   Wound Check    Kim Klein is a 43 y.o. female.  The history is provided by the patient and medical records. No language interpreter was used.  Wound Check This is a new problem. The current episode started more than 2 days ago. The problem has been gradually improving. Pertinent negatives include no chest pain, no abdominal pain, no headaches and no shortness of breath. Nothing aggravates the symptoms. Nothing relieves the symptoms. She has tried nothing for the symptoms. The treatment provided no relief.       Home Medications Prior to Admission medications   Medication Sig Start Date End Date Taking? Authorizing Provider  albuterol  (PROVENTIL  HFA;VENTOLIN  HFA) 108 (90 Base) MCG/ACT inhaler Inhale 2 puffs into the lungs every 6 (six) hours as needed for wheezing or shortness of breath. 05/15/18   Lavell Lye A, FNP  cetirizine  (ZYRTEC ) 10 MG tablet TAKE 1 TABLET BY MOUTH EVERY DAY 11/12/17   Jolinda Potter M, DO  escitalopram  (LEXAPRO ) 20 MG tablet Take 1 tablet (20 mg total) by mouth daily. 12/21/17   Jolinda Potter HERO, DO  fluconazole  (DIFLUCAN ) 150 MG tablet Take 1 tablet (150 mg total) by mouth every three (3) days as needed. 12/08/18   Lavell Lye LABOR, FNP  levonorgestrel -ethinyl estradiol (ALTAVERA) 0.15-30 MG-MCG tablet Take 1 tablet by mouth daily. (Needs to be seen before next refill) 03/13/19   Jolinda Potter HERO, DO  LORazepam  (ATIVAN ) 0.5 MG tablet Take 1 tablet (0.5 mg total) by mouth 2 (two) times daily as needed for anxiety. Patient not taking: Reported on 06/20/2018 06/05/17   Jolinda Potter HERO, DO  metFORMIN  (GLUCOPHAGE -XR) 500 MG 24 hr tablet Take 2 tablets (1,000 mg total) by mouth daily with supper. 11/21/17   Jolinda Potter HERO, DO  metroNIDAZOLE  (FLAGYL ) 500 MG tablet  Take 1 tablet (500 mg total) by mouth 2 (two) times daily. 07/19/18   Webb, Padonda B, FNP  phentermine  (ADIPEX-P ) 37.5 MG tablet Take 0.5-1 tablets (18.75-37.5 mg total) by mouth daily before breakfast. 06/20/18   Lavell Lye A, FNP  Vitamin D , Ergocalciferol , (DRISDOL ) 1.25 MG (50000 UT) CAPS capsule Take 1 capsule (50,000 Units total) by mouth every 7 (seven) days. 07/15/18   Jolinda Potter HERO, DO      Allergies    Augmentin [amoxicillin-pot clavulanate], Cinnamon, and Strawberry extract    Review of Systems   Review of Systems  Constitutional:  Negative for chills, fatigue and fever.  HENT:  Negative for congestion.   Eyes:  Negative for pain and visual disturbance.  Respiratory:  Negative for cough and shortness of breath.   Cardiovascular:  Negative for chest pain and palpitations.  Gastrointestinal:  Negative for abdominal pain and vomiting.  Genitourinary:  Negative for dysuria, hematuria and pelvic pain (improving).  Musculoskeletal:  Negative for back pain.  Skin:  Positive for wound.  Neurological:  Negative for seizures, syncope, light-headedness and headaches.  Psychiatric/Behavioral:  Negative for agitation.   All other systems reviewed and are negative.   Physical Exam Updated Vital Signs BP (!) 143/94   Pulse 75   Temp 98.2 F (36.8 C) (Oral)   Resp 16   SpO2 100%  Physical Exam Vitals and nursing note reviewed. Exam conducted with a chaperone present.  Constitutional:      General:  She is not in acute distress.    Appearance: She is well-developed. She is not ill-appearing, toxic-appearing or diaphoretic.  HENT:     Head: Normocephalic and atraumatic.  Eyes:     Conjunctiva/sclera: Conjunctivae normal.     Pupils: Pupils are equal, round, and reactive to light.  Cardiovascular:     Rate and Rhythm: Normal rate and regular rhythm.     Heart sounds: No murmur heard. Pulmonary:     Effort: Pulmonary effort is normal. No respiratory distress.     Breath  sounds: Normal breath sounds. No wheezing, rhonchi or rales.  Chest:     Chest wall: No tenderness.  Abdominal:     General: Abdomen is flat.     Palpations: Abdomen is soft.     Tenderness: There is no abdominal tenderness. There is no guarding or rebound.  Genitourinary:    Comments: Patient has a incision and drainage wound on her left perineal area that is not focally tender did not have fluctuance or crepitus and does not have significant erythema.  There is no significant drainage or bleeding and I do not see any further packing.  I suspect it fell out as patient reported.  I offered to try probing deeper to any packing however patient her symptoms and evaluation I do not think there is any still in there.  We discussed trying to open up and washout further patient did not want this.   Musculoskeletal:     Cervical back: Neck supple.  Skin:    General: Skin is warm and dry.     Capillary Refill: Capillary refill takes less than 2 seconds.  Neurological:     Mental Status: She is alert.  Psychiatric:        Mood and Affect: Mood normal.     ED Results / Procedures / Treatments   Labs (all labs ordered are listed, but only abnormal results are displayed) Labs Reviewed - No data to display  EKG None  Radiology No results found.  Procedures Procedures    Medications Ordered in ED Medications - No data to display  ED Course/ Medical Decision Making/ A&P                                 Medical Decision Making   Kim Klein is a 43 y.o. female with a past medical history significant for hyperlipidemia, anxiety, and PCOS who recently had a vaginal abscess drained and packed presents for wound evaluation.  Patient reports that several days ago she had a vaginal abscess drained and had immediate pain.  She reports it is still oozing and bleeding some but is doing better.  She is still on antibiotic she was prescribed initially.  She said that a piece of packing fell  out but she thought there might be 2 pieces there and wants to make sure it is not still packing in place that history removed before closed.  Otherwise denies worsening pain.  Denies fevers, chills, nausea, vomiting, constipation, diarrhea, or other complaints.  On exam, lungs.  Chest nontender.  Abdomen nontender.  Will get chaperone and evaluate her wound and remove any packing present.  Anticipate discharge after.   1:38 PM Patient's wound was examined with chaperone was well-appearing.  I did not see evidence of worsening abscess, cellulitis, induration or fluctuance.  There was no packing that I could see in the wound, initial external evaluation  and I used a long Q-tip to try to evaluate deeper and did not find any iodoform gauze present.  We discussed the possibility of a closing sound still and there that would need further opening but she did not want us  to probe deeper this time.  Patient will continue antibiotics to completion and will follow-up with PCP.  She noted questions or concerns and was discharged in good condition with understanding return precautions.         Final Clinical Impression(s) / ED Diagnoses Final diagnoses:  Abscess re-check    Rx / DC Orders ED Discharge Orders     None      Clinical Impression: 1. Abscess re-check     Disposition: Discharge  Condition: Good  I have discussed the results, Dx and Tx plan with the pt(& family if present). He/she/they expressed understanding and agree(s) with the plan. Discharge instructions discussed at great length. Strict return precautions discussed and pt &/or family have verbalized understanding of the instructions. No further questions at time of discharge.    New Prescriptions   No medications on file    Follow Up: [facility] [address] [place] [address] [place]  [address] [phone] Schedule an appointment as soon as possible for a visit     [facility] Emergency Department at [facility] [address] [place]  [address] [phone]       Yvette Roark, Lonni PARAS, MD 03/10/23 1340

## 2023-03-10 NOTE — Discharge Instructions (Signed)
 Your incision and drainage site was well-appearing I not see evidence of worsening infection, cellulitis, or pocket of infection needing recurrent.  Did not see any further gauze packing present even when we evaluated deeper with the Q-tip.  We discussed trying to open up further to look deeper but I do not suspect there is pain at this time.  Please continue antibiotics and follow-up with your primary doctor.  If any symptoms change or worsen acutely, return to nearest Emergency Department

## 2023-05-01 NOTE — Progress Notes (Unsigned)
 New Patient Office Visit  Subjective   Patient ID: Kim Klein, female    DOB: [birth year]  Age: 43 y.o. MRN: [id number]  CC: No chief complaint on file.   HPI Kim Klein presents to establish care ***  Outpatient Encounter Medications as of 05/03/2023  Medication Sig   albuterol (PROVENTIL HFA;VENTOLIN HFA) 108 (90 Base) MCG/ACT inhaler Inhale 2 puffs into the lungs every 6 (six) hours as needed for wheezing or shortness of breath.   cetirizine (ZYRTEC) 10 MG tablet TAKE 1 TABLET BY MOUTH EVERY DAY   escitalopram (LEXAPRO) 20 MG tablet Take 1 tablet (20 mg total) by mouth daily.   fluconazole (DIFLUCAN) 150 MG tablet Take 1 tablet (150 mg total) by mouth every three (3) days as needed.   levonorgestrel-ethinyl estradiol (ALTAVERA) 0.15-30 MG-MCG tablet Take 1 tablet by mouth daily. (Needs to be seen before next refill)   LORazepam (ATIVAN) 0.5 MG tablet Take 1 tablet (0.5 mg total) by mouth 2 (two) times daily as needed for anxiety. (Patient not taking: Reported on 06/20/2018)   metFORMIN (GLUCOPHAGE-XR) 500 MG 24 hr tablet Take 2 tablets (1,000 mg total) by mouth daily with supper.   metroNIDAZOLE (FLAGYL) 500 MG tablet Take 1 tablet (500 mg total) by mouth 2 (two) times daily.   phentermine (ADIPEX-P) 37.5 MG tablet Take 0.5-1 tablets (18.75-37.5 mg total) by mouth daily before breakfast.   Vitamin D, Ergocalciferol, (DRISDOL) 1.25 MG (50000 UT) CAPS capsule Take 1 capsule (50,000 Units total) by mouth every 7 (seven) days.   No facility-administered encounter medications on file as of 05/03/2023.    Past Medical History:  Diagnosis Date   Depression    PCOS (polycystic ovarian syndrome)     Past Surgical History:  Procedure Laterality Date   BREAST SURGERY     lump removed    Family History  Problem Relation Age of Onset   Cancer Maternal Aunt        BREAST   Cancer Maternal Grandmother        BREAST   COPD Maternal Grandmother    Diabetes Maternal  Grandmother     Social History   Socioeconomic History   Marital status: Divorced    Spouse name: Not on file   Number of children: Not on file   Years of education: Not on file   Highest education level: Not on file  Occupational History   Not on file  Tobacco Use   Smoking status: Never   Smokeless tobacco: Never  Substance and Sexual Activity   Alcohol use: No    Alcohol/week: 0.0 standard drinks of alcohol   Drug use: No   Sexual activity: Yes    Birth control/protection: Pill  Other Topics Concern   Not on file  Social History Narrative   Not on file   Social Drivers of Health   Financial Resource Strain: Not on file  Food Insecurity: Not on file  Transportation Needs: Not on file  Physical Activity: Not on file  Stress: Not on file  Social Connections: Not on file  Intimate Partner Violence: Not on file    ROS Negative unless indicated in HPI    Objective   There were no vitals taken for this visit.  Physical Exam  {Labs (Optional):23779}    Assessment & Plan:  There are no diagnoses linked to this encounter.  No follow-ups on file.   @Adaly Puder  Janee Morn DNP@  Note: This document was prepared by [employer] voice dictation  technology and any errors that results from this process are unintentional.

## 2023-05-03 ENCOUNTER — Encounter: Payer: Self-pay | Admitting: Nurse Practitioner

## 2023-05-03 ENCOUNTER — Telehealth: Payer: Self-pay | Admitting: Nurse Practitioner

## 2023-05-03 ENCOUNTER — Ambulatory Visit (INDEPENDENT_AMBULATORY_CARE_PROVIDER_SITE_OTHER): Admitting: Nurse Practitioner

## 2023-05-03 VITALS — BP 126/92 | HR 89 | Temp 98.2°F | Ht 61.0 in | Wt 221.0 lb

## 2023-05-03 DIAGNOSIS — Z0001 Encounter for general adult medical examination with abnormal findings: Secondary | ICD-10-CM

## 2023-05-03 DIAGNOSIS — I10 Essential (primary) hypertension: Secondary | ICD-10-CM

## 2023-05-03 DIAGNOSIS — E782 Mixed hyperlipidemia: Secondary | ICD-10-CM | POA: Diagnosis not present

## 2023-05-03 DIAGNOSIS — E282 Polycystic ovarian syndrome: Secondary | ICD-10-CM | POA: Diagnosis not present

## 2023-05-03 DIAGNOSIS — L680 Hirsutism: Secondary | ICD-10-CM | POA: Diagnosis not present

## 2023-05-03 MED ORDER — AMLODIPINE BESYLATE 5 MG PO TABS: 5.0000 mg | ORAL_TABLET | Freq: Every day | ORAL | 0 refills | Status: DC

## 2023-05-03 MED ORDER — METFORMIN HCL ER 500 MG PO TB24: 1000.0000 mg | ORAL_TABLET | Freq: Every day | ORAL | 1 refills | Status: AC

## 2023-05-03 MED ORDER — SPIRONOLACTONE 25 MG PO TABS: 25.0000 mg | ORAL_TABLET | Freq: Every day | ORAL | 0 refills | Status: DC

## 2023-05-03 NOTE — Telephone Encounter (Signed)
 Patient used to be a patient of Dr. Nadine Counts but relocated to [facility] and recently moved back. She saw Dois Davenport on 05/03/23 and did not think that she was a good fit, would like to start seeing Dr. Reece Agar again. Aware that she is currently not taking new patients but wanted to ask because going to a different practice.   Please call her back at [phone] to let her know if this will be possible.

## 2023-05-04 LAB — CMP14+EGFR
ALT: 12 IU/L (ref 0–32)
AST: 15 IU/L (ref 0–40)
Albumin: 4.4 g/dL (ref 3.9–4.9)
Alkaline Phosphatase: 96 IU/L (ref 44–121)
BUN/Creatinine Ratio: 12 (ref 9–23)
BUN: 12 mg/dL (ref 6–24)
Bilirubin Total: 0.4 mg/dL (ref 0.0–1.2)
CO2: 20 mmol/L (ref 20–29)
Calcium: 9.5 mg/dL (ref 8.7–10.2)
Chloride: 106 mmol/L (ref 96–106)
Creatinine, Ser: 0.97 mg/dL (ref 0.57–1.00)
Globulin, Total: 2.7 g/dL (ref 1.5–4.5)
Glucose: 75 mg/dL (ref 70–99)
Potassium: 3.7 mmol/L (ref 3.5–5.2)
Sodium: 139 mmol/L (ref 134–144)
Total Protein: 7.1 g/dL (ref 6.0–8.5)
eGFR: 75 mL/min/{1.73_m2} (ref >59)

## 2023-05-04 LAB — CBC WITH DIFFERENTIAL/PLATELET
Basophils Absolute: 0.1 10*3/uL (ref 0.0–0.2)
Basos: 1 %
EOS (ABSOLUTE): 0.1 10*3/uL (ref 0.0–0.4)
Eos: 1 %
Hematocrit: 44 % (ref 34.0–46.6)
Hemoglobin: 14.2 g/dL (ref 11.1–15.9)
Immature Grans (Abs): 0 10*3/uL (ref 0.0–0.1)
Immature Granulocytes: 0 %
Lymphocytes Absolute: 2.6 10*3/uL (ref 0.7–3.1)
Lymphs: 49 %
MCH: 28.7 pg (ref 26.6–33.0)
MCHC: 32.3 g/dL (ref 31.5–35.7)
MCV: 89 fL (ref 79–97)
Monocytes Absolute: 0.3 10*3/uL (ref 0.1–0.9)
Monocytes: 6 %
Neutrophils Absolute: 2.3 10*3/uL (ref 1.4–7.0)
Neutrophils: 43 %
Platelets: 389 10*3/uL (ref 150–450)
RBC: 4.95 x10E6/uL (ref 3.77–5.28)
RDW: 13.2 % (ref 11.7–15.4)
WBC: 5.4 10*3/uL (ref 3.4–10.8)

## 2023-05-04 LAB — PREGNANCY, URINE: Preg Test, Ur: NEGATIVE

## 2023-05-04 LAB — LIPID PANEL
Chol/HDL Ratio: 3.9 ratio (ref 0.0–4.4)
Cholesterol, Total: 228 mg/dL — ABNORMAL HIGH (ref 100–199)
HDL: 58 mg/dL (ref >39)
LDL Chol Calc (NIH): 160 mg/dL — ABNORMAL HIGH (ref 0–99)
Triglycerides: 57 mg/dL (ref 0–149)
VLDL Cholesterol Cal: 10 mg/dL (ref 5–40)

## 2023-05-04 LAB — THYROID PANEL WITH TSH
Free Thyroxine Index: 1.9 (ref 1.2–4.9)
T3 Uptake Ratio: 26 % (ref 24–39)
T4, Total: 7.3 ug/dL (ref 4.5–12.0)
TSH: 2.04 u[IU]/mL (ref 0.450–4.500)

## 2023-05-04 NOTE — Telephone Encounter (Signed)
 I sooo wish I could.  Sadly, the panel size is not accommodating any add ons right now, as we are overfull.  She is welcome to continue checking back in the future.  I'd recommend she consider Marcelino Duster or Elmarie Shiley though. They are very similar to me in their approach to care.

## 2023-05-16 ENCOUNTER — Other Ambulatory Visit: Payer: Self-pay | Admitting: Family Medicine

## 2023-05-16 MED ORDER — ESCITALOPRAM OXALATE 20 MG PO TABS: 20.0000 mg | ORAL_TABLET | Freq: Every day | ORAL | 0 refills | Status: AC

## 2023-05-23 NOTE — Telephone Encounter (Signed)
 Explained this request was for a controlled medication that can not be filled outside of a visit and would not have been filled during a first office visit. FU appt made for Wed 05/30/23

## 2023-05-23 NOTE — Telephone Encounter (Signed)
 The pt called in to f/u on this refill request. She states she received her Lexapro  but not the Adderall, please advise.

## 2023-05-28 NOTE — Progress Notes (Unsigned)
   Established Patient Office Visit  Subjective  Patient ID: Kim Klein, female    DOB: 07/19/1980  Age: 43 y.o. MRN: [id number]  No chief complaint on file.   HPI  {History (Optional):23778}  ROS Negative unless indicated in HPI   Objective:     There were no vitals taken for this visit. {Vitals History (Optional):23777}  Physical Exam   No results found for any visits on 05/30/23.  {Labs (Optional):23779}    Assessment & Plan:  There are no diagnoses linked to this encounter.  No follow-ups on file.    @Kim Klein  Kim Klein, [place]    Note: This document was prepared by [employer] voice dictation technology and any errors that results from this process are unintentional.

## 2023-05-30 ENCOUNTER — Encounter: Payer: Self-pay | Admitting: Nurse Practitioner

## 2023-05-30 ENCOUNTER — Ambulatory Visit (INDEPENDENT_AMBULATORY_CARE_PROVIDER_SITE_OTHER): Admitting: Nurse Practitioner

## 2023-05-30 VITALS — BP 111/75 | HR 84 | Temp 98.3°F | Ht 61.0 in | Wt 224.3 lb

## 2023-05-30 DIAGNOSIS — F902 Attention-deficit hyperactivity disorder, combined type: Secondary | ICD-10-CM | POA: Insufficient documentation

## 2023-05-30 MED ORDER — GUANFACINE HCL ER 2 MG PO TB24: 2.0000 mg | ORAL_TABLET | Freq: Every day | ORAL | 0 refills | Status: DC

## 2023-06-12 DIAGNOSIS — F909 Attention-deficit hyperactivity disorder, unspecified type: Secondary | ICD-10-CM

## 2023-07-01 ENCOUNTER — Encounter

## 2023-07-04 DIAGNOSIS — G43009 Migraine without aura, not intractable, without status migrainosus: Secondary | ICD-10-CM | POA: Insufficient documentation

## 2023-07-04 DIAGNOSIS — J3081 Allergic rhinitis due to animal (cat) (dog) hair and dander: Secondary | ICD-10-CM | POA: Insufficient documentation

## 2023-07-04 DIAGNOSIS — I10 Essential (primary) hypertension: Secondary | ICD-10-CM | POA: Insufficient documentation

## 2023-09-01 ENCOUNTER — Other Ambulatory Visit: Payer: Self-pay | Admitting: Nurse Practitioner

## 2023-09-01 ENCOUNTER — Encounter

## 2023-09-01 DIAGNOSIS — F902 Attention-deficit hyperactivity disorder, combined type: Secondary | ICD-10-CM

## 2023-09-01 DIAGNOSIS — L680 Hirsutism: Secondary | ICD-10-CM

## 2023-09-01 DIAGNOSIS — I10 Essential (primary) hypertension: Secondary | ICD-10-CM

## 2023-10-05 ENCOUNTER — Other Ambulatory Visit: Payer: Self-pay | Admitting: Nurse Practitioner

## 2023-10-05 DIAGNOSIS — F902 Attention-deficit hyperactivity disorder, combined type: Secondary | ICD-10-CM

## 2023-12-26 ENCOUNTER — Ambulatory Visit: Admission: EM | Admit: 2023-12-26 | Discharge: 2023-12-26 | Disposition: A | Discharge status: Home / Self Care

## 2023-12-26 DIAGNOSIS — B029 Zoster without complications: Secondary | ICD-10-CM

## 2023-12-26 MED ORDER — VALACYCLOVIR HCL 1 G PO TABS: 1000.0000 mg | ORAL_TABLET | Freq: Three times a day (TID) | ORAL | 0 refills | Status: AC

## 2023-12-26 MED ORDER — GABAPENTIN 300 MG PO CAPS: 300.0000 mg | ORAL_CAPSULE | Freq: Three times a day (TID) | ORAL | 0 refills | Status: AC

## 2023-12-26 NOTE — ED Provider Notes (Signed)
 [facility]  Note:  This document was prepared using Conservation officer, historic buildings and may include unintentional dictation errors.  MRN: [id number] DOB: [birth year]  Subjective:   Kim Klein is a 43 y.o. female presenting for new onset rash to left anterior chest x 2 days with erythematous raised clusters that she noticed this morning.  Patient reports that rash is burning and painful and the area around it is itchy and tingling.  Patient reports that tingling/itching radiates to her axilla.  Patient denies any known exposure to poison ivy or anyone else with similar rash.  Patient was not sure if she got bit by an insect or the rash was from some other cause.  Patient has not used any over-the-counter ointment or cream to treat symptoms.  Patient denies any other secondary symptoms  No current facility-administered medications for this encounter.  Current Outpatient Medications:    Azelastine HCl 137 MCG/SPRAY SOLN, Place 1 spray into the nose., Disp: , Rfl:    Cholecalciferol 1.25 MG (50000 UT) capsule, Take 50,000 Units by mouth daily., Disp: , Rfl:    cyclobenzaprine  (FLEXERIL ) 5 MG tablet, Take 5 mg by mouth., Disp: , Rfl:    diclofenac  (VOLTAREN ) 75 MG EC tablet, Take 75 mg by mouth 2 (two) times daily., Disp: , Rfl:    diphenhydrAMINE (BENADRYL) 25 MG tablet, Take 25 mg by mouth every 6 (six) hours as needed., Disp: , Rfl:    diphenhydrAMINE-zinc  acetate (BENADRYL) cream, Apply 1 Application topically 3 (three) times daily as needed for itching., Disp: , Rfl:    gabapentin  (NEURONTIN ) 300 MG capsule, Take 1 capsule (300 mg total) by mouth 3 (three) times daily., Disp: 30 capsule, Rfl: 0   LORazepam  (ATIVAN ) 0.5 MG tablet, Take 0.5 mg by mouth as directed., Disp: , Rfl:    predniSONE  (DELTASONE ) 10 MG tablet, Take 10 mg by mouth daily with breakfast., Disp: , Rfl:    valACYclovir  (VALTREX ) 1000 MG tablet, Take 1 tablet (1,000 mg total) by mouth 3 (three) times daily.,  Disp: 21 tablet, Rfl: 0   albuterol  (PROVENTIL  HFA;VENTOLIN  HFA) 108 (90 Base) MCG/ACT inhaler, Inhale 2 puffs into the lungs every 6 (six) hours as needed for wheezing or shortness of breath., Disp: 1 Inhaler, Rfl: 0   amLODipine  (NORVASC ) 5 MG tablet, TAKE 1 TABLET (5 MG TOTAL) BY MOUTH DAILY., Disp: 90 tablet, Rfl: 0   amphetamine-dextroamphetamine (ADDERALL) 10 MG tablet, Take 10 mg by mouth in the morning and at bedtime., Disp: , Rfl:    cetirizine  (ZYRTEC ) 10 MG tablet, TAKE 1 TABLET BY MOUTH EVERY DAY, Disp: 90 tablet, Rfl: 1   escitalopram  (LEXAPRO ) 20 MG tablet, Take 1 tablet (20 mg total) by mouth daily., Disp: 90 tablet, Rfl: 0   guanFACINE  (INTUNIV ) 2 MG TB24 ER tablet, TAKE 1 TABLET BY MOUTH EVERY DAY, Disp: 90 tablet, Rfl: 0   ibuprofen (ADVIL) 600 MG tablet, Take 600 mg by mouth every 6 (six) hours as needed., Disp: , Rfl:    levonorgestrel -ethinyl estradiol (ALTAVERA) 0.15-30 MG-MCG tablet, Take 1 tablet by mouth daily. (Needs to be seen before next refill), Disp: 28 tablet, Rfl: 0   metFORMIN  (GLUCOPHAGE -XR) 500 MG 24 hr tablet, Take 2 tablets (1,000 mg total) by mouth daily with supper., Disp: 180 tablet, Rfl: 1   spironolactone  (ALDACTONE ) 25 MG tablet, TAKE 1 TABLET (25 MG TOTAL) BY MOUTH DAILY., Disp: 90 tablet, Rfl: 0   topiramate (TOPAMAX) 50 MG tablet, Take 50 mg by mouth  at bedtime., Disp: , Rfl:    Vitamin D , Ergocalciferol , (DRISDOL ) 1.25 MG (50000 UT) CAPS capsule, Take 1 capsule (50,000 Units total) by mouth every 7 (seven) days., Disp: 12 capsule, Rfl: 0   Allergies  Allergen Reactions   Amoxicillin-Pot Clavulanate Other (See Comments), Itching, Nausea And Vomiting and Nausea Only    Stomach pain and rectal bleeding  Other Reaction(s): Other (comments), Other (see comments)  Rectal bleeding  Bleeding from rectum  Stomach pain and rectal bleeding  Rectal bleeding   Rectal bleeding  Stomach pain and rectal bleeding    Rectal bleeding Bleeding from rectum  Stomach pain and rectal bleeding Rectal bleeding     Bleeding from rectum    Rectal bleeding     Rectal bleeding  Rectal bleeding    Bleeding from rectum    Rectal bleeding     Stomach pain and rectal bleeding  Rectal bleeding Bleeding from rectum Stomach pain and rectal bleeding Rectal bleeding   Bleeding from rectum  Rectal bleeding   Rectal bleeding    Rectal bleeding Bleeding from rectum Stomach pain and rectal bleeding Rectal bleeding     Stomach pain and rectal bleeding   Cat Dander Cough, Other (See Comments), Itching and Shortness Of Breath    Other Reaction(s): Thrombocytopenia   Cinnamon Anaphylaxis, Hives and Swelling   Dust Mite Extract Anaphylaxis, Itching and Shortness Of Breath   Grass Pollen(K-O-R-T-Swt Vern) Cough and Shortness Of Breath    Other Reaction(s): Bronchospasm   Strawberry Extract Anaphylaxis, Hives and Swelling    Tongue swelling    Past Medical History:  Diagnosis Date   ADHD 2019   Allergy    Anxiety    Asthma    Depression    Hypertension    PCOS (polycystic ovarian syndrome)      Past Surgical History:  Procedure Laterality Date   ACHILLES TENDON REPAIR Right 2019   BREAST SURGERY     lump removed    Family History  Problem Relation Age of Onset   Cancer Maternal Aunt        BREAST   Cancer Maternal Grandmother        BREAST   COPD Maternal Grandmother    Diabetes Maternal Grandmother     Social History   Tobacco Use   Smoking status: Never   Smokeless tobacco: Never  Vaping Use   Vaping status: Never Used  Substance Use Topics   Alcohol use: No    Alcohol/week: 0.0 standard drinks of alcohol   Drug use: No    ROS Refer to HPI for ROS details.  Objective:   Vitals: BP 118/82 (BP Location: Left Arm)   Pulse 75   Temp 98.5 F (36.9 C) (Oral)   Resp 18   Ht 5' 1 (1.549 m)   Wt 225 lb (102.1 kg)   LMP 12/26/2023 (Exact Date)   SpO2 99%   BMI 42.51 kg/m   Physical Exam Vitals and nursing note  reviewed.  Constitutional:      General: She is not in acute distress.    Appearance: Normal appearance. She is not ill-appearing.  HENT:     Head: Normocephalic.  Cardiovascular:     Rate and Rhythm: Normal rate.  Pulmonary:     Effort: Pulmonary effort is normal. No respiratory distress.  Skin:    General: Skin is warm and dry.     Capillary Refill: Capillary refill takes less than 2 seconds.     Findings: Erythema  and rash present. Rash is macular and vesicular.      Neurological:     General: No focal deficit present.     Mental Status: She is alert and oriented to person, place, and time.  Psychiatric:        Mood and Affect: Mood normal.        Behavior: Behavior normal.     Procedures  No results found for this or any previous visit (from the past 24 hours).  No results found.   Assessment and Plan :     Discharge Instructions       1. Herpes zoster without complication (Primary) - valACYclovir  (VALTREX ) 1000 MG tablet; Take 1 tablet (1,000 mg total) by mouth 3 (three) times daily.  Dispense: 21 tablet; Refill: 0 - gabapentin  (NEURONTIN ) 300 MG capsule; Take 1 capsule (300 mg total) by mouth 3 (three) times daily.  Dispense: 30 capsule; Refill: 0  -Continue to monitor symptoms for any change in severity if there is any escalation of current symptoms or development of new symptoms follow-up in ER for further evaluation and management.       Ira Dougher B Elnita Surprenant   Kenroy Timberman, [place], [place] 12/26/23 1810

## 2023-12-26 NOTE — Discharge Instructions (Addendum)
  1. Herpes zoster without complication (Primary) - valACYclovir (VALTREX) 1000 MG tablet; Take 1 tablet (1,000 mg total) by mouth 3 (three) times daily.  Dispense: 21 tablet; Refill: 0 - gabapentin (NEURONTIN) 300 MG capsule; Take 1 capsule (300 mg total) by mouth 3 (three) times daily.  Dispense: 30 capsule; Refill: 0 -Continue to monitor symptoms for any change in severity if there is any escalation of current symptoms or development of new symptoms follow-up in ER for further evaluation and management.

## 2023-12-26 NOTE — ED Triage Notes (Signed)
 Patient here for possible bite on left side of chest, now today with a rash/raised area on the area, with cluster of bumps, painful area now today. Some chills noted. No fever known.

## 2024-01-01 ENCOUNTER — Telehealth: Payer: Self-pay | Admitting: Emergency Medicine

## 2024-01-02 ENCOUNTER — Inpatient Hospital Stay: Admission: RE | Admit: 2024-01-02 | Discharge: 2024-01-02

## 2024-01-02 VITALS — BP 139/86 | HR 104 | Temp 98.4°F | Resp 20 | Wt 225.0 lb

## 2024-01-02 DIAGNOSIS — B029 Zoster without complications: Secondary | ICD-10-CM | POA: Diagnosis not present

## 2024-01-02 NOTE — ED Triage Notes (Signed)
 Pt presents c/o shingles x 8 days. Pt states,  I came here the night before thanksgiving and got treated for Shingles. I missed about 2 doses of the Valtrex  and now I have another break out. It's painful. They sting like a stabbing pain periodically. I don't take the gabapentin  unless I have to b/c it knocks me out. I came here today because my job says I can't return until it's completely gone. I just need a note for the rest of this week.

## 2024-01-02 NOTE — Discharge Instructions (Signed)
 Patient has been properly treated for shingles, patient is still experiencing lesions that have not yet scabbed over.

## 2024-01-02 NOTE — ED Provider Notes (Signed)
 [facility]    CSN: [id number] Arrival date & time: 01/02/24  1155      History   Chief Complaint Chief Complaint  Patient presents with   Herpes Zoster    HPI Kim Klein is a 43 y.o. female.   Patient returns after being diagnosed with herpes zoster on 11/26.  Patient states that she was given valacyclovir  to take 3 times a day for 7 days, 2 of those days she only took medicine twice a day and started to notice new lesions appear.  Patient needs a work note because school is not letting her return until all lesions have scabbed over.  Patient states that she is still taking the medicine.  The history is provided by the patient.    Past Medical History:  Diagnosis Date   ADHD 2019   Allergy    Anxiety    Asthma    Depression    Hypertension    PCOS (polycystic ovarian syndrome)     Patient Active Problem List   Diagnosis Date Noted   Allergic rhinitis due to animal hair and dander 07/04/2023   Migraine without aura and without status migrainosus, not intractable 07/04/2023   Primary hypertension 07/04/2023   Attention deficit hyperactivity disorder (ADHD), combined type 05/30/2023   Sprain of anterior talofibular ligament of left ankle 09/08/2020   Severe obesity (HCC) 02/18/2018   Herpes zoster without complication 12/14/2015   Sciatica of right side 12/14/2015   Vitamin D  deficiency 08/31/2015   Hyperlipemia 08/31/2015   GAD (generalized anxiety disorder) 06/29/2015   Obesity (BMI 30-39.9) 06/29/2015   Class 3 severe obesity due to excess calories with serious comorbidity and body mass index (BMI) of 40.0 to 44.9 in adult (HCC) 06/29/2015   Polycystic ovarian syndrome 07/18/2013    Past Surgical History:  Procedure Laterality Date   ACHILLES TENDON REPAIR Right 2019   BREAST SURGERY     lump removed    OB History   No obstetric history on file.      Home Medications    Prior to Admission medications   Medication Sig Start Date  End Date Taking? Authorizing Provider  doxycycline  (VIBRA -TABS) 100 MG tablet Take 100 mg by mouth 2 (two) times daily. 10/11/23  Yes [provider]  albuterol  (PROVENTIL  HFA;VENTOLIN  HFA) 108 (90 Base) MCG/ACT inhaler Inhale 2 puffs into the lungs every 6 (six) hours as needed for wheezing or shortness of breath. 05/15/18   Hawks, Bari A, FNP  amLODipine  (NORVASC ) 5 MG tablet TAKE 1 TABLET (5 MG TOTAL) BY MOUTH DAILY. 09/03/23   [facility] Sebastian Pool, NP  amphetamine-dextroamphetamine (ADDERALL) 10 MG tablet Take 10 mg by mouth in the morning and at bedtime.    [provider]  Azelastine HCl 137 MCG/SPRAY SOLN Place 1 spray into the nose. 08/06/23 08/05/24  [provider]  cetirizine  (ZYRTEC ) 10 MG tablet TAKE 1 TABLET BY MOUTH EVERY DAY 11/12/17   Jolinda Potter M, DO  Cholecalciferol 1.25 MG (50000 UT) capsule Take 50,000 Units by mouth daily. 04/02/18   [provider]  cyclobenzaprine  (FLEXERIL ) 5 MG tablet Take 5 mg by mouth. 08/09/23   [provider]  diclofenac  (VOLTAREN ) 75 MG EC tablet Take 75 mg by mouth 2 (two) times daily. 10/18/23   [provider]  diphenhydrAMINE (BENADRYL) 25 MG tablet Take 25 mg by mouth every 6 (six) hours as needed.    [provider]  diphenhydrAMINE-zinc  acetate (BENADRYL) cream Apply 1  Application topically 3 (three) times daily as needed for itching.    [provider]  escitalopram  (LEXAPRO ) 20 MG tablet Take 1 tablet (20 mg total) by mouth daily. 05/16/23   [facility] Sebastian Pool, NP  gabapentin  (NEURONTIN ) 300 MG capsule Take 1 capsule (300 mg total) by mouth 3 (three) times daily. 12/26/23   Reddick, Johnathan B, NP  guanFACINE  (INTUNIV ) 2 MG TB24 ER tablet TAKE 1 TABLET BY MOUTH EVERY DAY 10/08/23   [facility] Sebastian Pool, NP  ibuprofen (ADVIL) 600 MG tablet Take 600 mg by mouth every 6 (six) hours as needed.    [provider]  levonorgestrel -ethinyl estradiol  (ALTAVERA) 0.15-30 MG-MCG tablet Take 1 tablet by mouth daily. (Needs to be seen before next refill) 03/13/19   Jolinda Norene HERO, DO  LORazepam  (ATIVAN ) 0.5 MG tablet Take 0.5 mg by mouth as directed. 06/05/17   [provider]  metFORMIN  (GLUCOPHAGE -XR) 500 MG 24 hr tablet Take 2 tablets (1,000 mg total) by mouth daily with supper. 05/03/23   [facility] Sebastian Pool, NP  predniSONE  (DELTASONE ) 10 MG tablet Take 10 mg by mouth daily with breakfast. 08/09/23   [provider]  spironolactone  (ALDACTONE ) 25 MG tablet TAKE 1 TABLET (25 MG TOTAL) BY MOUTH DAILY. 09/03/23   [facility] Sebastian Pool, NP  topiramate (TOPAMAX) 50 MG tablet Take 50 mg by mouth at bedtime.    [provider]  valACYclovir  (VALTREX ) 1000 MG tablet Take 1 tablet (1,000 mg total) by mouth 3 (three) times daily. 12/26/23   Reddick, Johnathan B, NP  Vitamin D , Ergocalciferol , (DRISDOL ) 1.25 MG (50000 UT) CAPS capsule Take 1 capsule (50,000 Units total) by mouth every 7 (seven) days. 07/15/18   Jolinda Norene HERO, DO    Family History Family History  Problem Relation Age of Onset   Cancer Maternal Aunt        BREAST   Cancer Maternal Grandmother        BREAST   COPD Maternal Grandmother    Diabetes Maternal Grandmother     Social History Social History   Tobacco Use   Smoking status: Never    Passive exposure: Never   Smokeless tobacco: Never  Vaping Use   Vaping status: Never Used  Substance Use Topics   Alcohol use: No    Alcohol/week: 0.0 standard drinks of alcohol   Drug use: No     Allergies   Amoxicillin-pot clavulanate, Cat dander, Cinnamon, Dust mite extract, Grass pollen([id number]-swt vern), Strawberry extract, and Wound dressing adhesive   Review of Systems Review of Systems   Physical Exam Triage Vital Signs ED Triage Vitals  Encounter Vitals Group     BP 01/02/24 1223 139/86     Girls Systolic BP Percentile --      Girls Diastolic BP Percentile --      Boys  Systolic BP Percentile --      Boys Diastolic BP Percentile --      Pulse Rate 01/02/24 1223 (!) 104     Resp 01/02/24 1223 20     Temp 01/02/24 1223 98.4 F (36.9 C)     Temp Source 01/02/24 1223 Oral     SpO2 01/02/24 1223 98 %     Weight 01/02/24 1222 225 lb (102.1 kg)     Height --      Head Circumference --      Peak Flow --      Pain Score 01/02/24 1219 7  Pain Loc --      Pain Education --      Exclude from Growth Chart --    No data found.  Updated Vital Signs BP 139/86 (BP Location: Right Arm)   Pulse (!) 104   Temp 98.4 F (36.9 C) (Oral)   Resp 20   Wt 225 lb (102.1 kg)   LMP 12/26/2023 (Exact Date)   SpO2 98%   BMI 42.51 kg/m   Visual Acuity Right Eye Distance:   Left Eye Distance:   Bilateral Distance:    Right Eye Near:   Left Eye Near:    Bilateral Near:     Physical Exam Vitals and nursing note reviewed.  Constitutional:      General: She is not in acute distress.    Appearance: Normal appearance. She is not ill-appearing, toxic-appearing or diaphoretic.  Eyes:     General: No scleral icterus. Cardiovascular:     Rate and Rhythm: Normal rate and regular rhythm.     Heart sounds: Normal heart sounds.  Pulmonary:     Effort: Pulmonary effort is normal. No respiratory distress.     Breath sounds: Normal breath sounds. No wheezing or rhonchi.  Skin:    General: Skin is warm.     Findings: Rash present. Rash is vesicular.         Comments: Vesicular eruption noted to where indicated on diagram  Neurological:     Mental Status: She is alert and oriented to person, place, and time.  Psychiatric:        Mood and Affect: Mood normal.        Behavior: Behavior normal.      UC Treatments / Results  Labs (all labs ordered are listed, but only abnormal results are displayed) Labs Reviewed - No data to display  EKG   Radiology No results found.  Procedures Procedures (including critical care time)  Medications Ordered in  UC Medications - No data to display  Initial Impression / Assessment and Plan / UC Course  I have reviewed the triage vital signs and the nursing notes.  Pertinent labs & imaging results that were available during my care of the patient were reviewed by me and considered in my medical decision making (see chart for details).    Final Clinical Impressions(s) / UC Diagnoses   Final diagnoses:  Herpes zoster without complication     Discharge Instructions      Patient has been properly treated for shingles, patient is still experiencing lesions that have not yet scabbed over.    ED Prescriptions   None    PDMP not reviewed this encounter.   Andra Corean BROCKS, PA-C 01/02/24 1439
